# Patient Record
Sex: Female | Born: 1980 | Race: White | Hispanic: No | Marital: Married | Smoking: Current some day smoker
Health system: Southern US, Community
[De-identification: ages and names within clinical notes are randomized; demographics above are authoritative.]

## PROBLEM LIST (undated history)

## (undated) ENCOUNTER — Emergency Department (HOSPITAL_COMMUNITY): Admission: EM | Payer: Medicaid Other | Source: Home / Self Care

## (undated) DIAGNOSIS — R87619 Unspecified abnormal cytological findings in specimens from cervix uteri: Secondary | ICD-10-CM

## (undated) DIAGNOSIS — N200 Calculus of kidney: Secondary | ICD-10-CM

## (undated) DIAGNOSIS — R51 Headache: Secondary | ICD-10-CM

## (undated) DIAGNOSIS — Z8719 Personal history of other diseases of the digestive system: Secondary | ICD-10-CM

## (undated) DIAGNOSIS — IMO0002 Reserved for concepts with insufficient information to code with codable children: Secondary | ICD-10-CM

## (undated) DIAGNOSIS — D649 Anemia, unspecified: Secondary | ICD-10-CM

## (undated) DIAGNOSIS — K219 Gastro-esophageal reflux disease without esophagitis: Secondary | ICD-10-CM

## (undated) DIAGNOSIS — R079 Chest pain, unspecified: Secondary | ICD-10-CM

## (undated) DIAGNOSIS — F419 Anxiety disorder, unspecified: Secondary | ICD-10-CM

## (undated) DIAGNOSIS — R519 Headache, unspecified: Secondary | ICD-10-CM

## (undated) HISTORY — DX: Chest pain, unspecified: R07.9

## (undated) HISTORY — PX: UPPER GASTROINTESTINAL ENDOSCOPY: SHX188

## (undated) HISTORY — PX: WISDOM TOOTH EXTRACTION: SHX21

## (undated) HISTORY — PX: CHOLECYSTECTOMY: SHX55

## (undated) HISTORY — DX: Reserved for concepts with insufficient information to code with codable children: IMO0002

## (undated) HISTORY — PX: ABDOMINAL HYSTERECTOMY: SHX81

## (undated) HISTORY — DX: Unspecified abnormal cytological findings in specimens from cervix uteri: R87.619

---

## 1998-06-20 ENCOUNTER — Emergency Department (HOSPITAL_COMMUNITY): Admission: EM | Admit: 1998-06-20 | Discharge: 1998-06-20 | Payer: Self-pay | Admitting: Emergency Medicine

## 1998-09-01 ENCOUNTER — Other Ambulatory Visit: Admission: RE | Admit: 1998-09-01 | Discharge: 1998-09-01 | Payer: Self-pay | Admitting: *Deleted

## 1998-10-01 ENCOUNTER — Encounter (INDEPENDENT_AMBULATORY_CARE_PROVIDER_SITE_OTHER): Payer: Self-pay | Admitting: Specialist

## 1998-10-01 ENCOUNTER — Other Ambulatory Visit: Admission: RE | Admit: 1998-10-01 | Discharge: 1998-10-01 | Payer: Self-pay | Admitting: *Deleted

## 1999-04-19 ENCOUNTER — Other Ambulatory Visit: Admission: RE | Admit: 1999-04-19 | Discharge: 1999-04-19 | Payer: Self-pay | Admitting: Obstetrics and Gynecology

## 1999-05-20 ENCOUNTER — Other Ambulatory Visit: Admission: RE | Admit: 1999-05-20 | Discharge: 1999-05-20 | Payer: Self-pay | Admitting: Obstetrics and Gynecology

## 1999-05-20 ENCOUNTER — Encounter (INDEPENDENT_AMBULATORY_CARE_PROVIDER_SITE_OTHER): Payer: Self-pay | Admitting: Specialist

## 1999-05-31 ENCOUNTER — Ambulatory Visit (HOSPITAL_COMMUNITY): Admission: RE | Admit: 1999-05-31 | Discharge: 1999-05-31 | Payer: Self-pay | Admitting: Obstetrics and Gynecology

## 1999-10-11 ENCOUNTER — Other Ambulatory Visit: Admission: RE | Admit: 1999-10-11 | Discharge: 1999-10-11 | Payer: Self-pay | Admitting: *Deleted

## 2000-02-29 ENCOUNTER — Other Ambulatory Visit: Admission: RE | Admit: 2000-02-29 | Discharge: 2000-02-29 | Payer: Self-pay | Admitting: *Deleted

## 2000-10-09 ENCOUNTER — Inpatient Hospital Stay (HOSPITAL_COMMUNITY): Admission: AD | Admit: 2000-10-09 | Discharge: 2000-10-09 | Payer: Self-pay | Admitting: *Deleted

## 2000-10-15 ENCOUNTER — Inpatient Hospital Stay (HOSPITAL_COMMUNITY): Admission: AD | Admit: 2000-10-15 | Discharge: 2000-10-15 | Payer: Self-pay | Admitting: *Deleted

## 2001-02-26 ENCOUNTER — Inpatient Hospital Stay (HOSPITAL_COMMUNITY): Admission: AD | Admit: 2001-02-26 | Discharge: 2001-02-26 | Payer: Self-pay | Admitting: *Deleted

## 2001-04-05 ENCOUNTER — Inpatient Hospital Stay (HOSPITAL_COMMUNITY): Admission: AD | Admit: 2001-04-05 | Discharge: 2001-04-07 | Payer: Self-pay | Admitting: *Deleted

## 2001-04-05 ENCOUNTER — Encounter (INDEPENDENT_AMBULATORY_CARE_PROVIDER_SITE_OTHER): Payer: Self-pay

## 2001-11-01 ENCOUNTER — Other Ambulatory Visit: Admission: RE | Admit: 2001-11-01 | Discharge: 2001-11-01 | Payer: Self-pay | Admitting: *Deleted

## 2002-05-27 ENCOUNTER — Other Ambulatory Visit: Admission: RE | Admit: 2002-05-27 | Discharge: 2002-05-27 | Payer: Self-pay | Admitting: *Deleted

## 2002-11-14 ENCOUNTER — Other Ambulatory Visit: Admission: RE | Admit: 2002-11-14 | Discharge: 2002-11-14 | Payer: Self-pay | Admitting: *Deleted

## 2003-10-03 ENCOUNTER — Other Ambulatory Visit: Admission: RE | Admit: 2003-10-03 | Discharge: 2003-10-03 | Payer: Self-pay | Admitting: Obstetrics and Gynecology

## 2004-11-12 ENCOUNTER — Inpatient Hospital Stay (HOSPITAL_COMMUNITY): Admission: AD | Admit: 2004-11-12 | Discharge: 2004-11-14 | Payer: Self-pay | Admitting: Obstetrics and Gynecology

## 2004-11-16 ENCOUNTER — Encounter: Admission: RE | Admit: 2004-11-16 | Discharge: 2004-12-16 | Payer: Self-pay | Admitting: Obstetrics and Gynecology

## 2005-01-20 ENCOUNTER — Ambulatory Visit: Payer: Self-pay | Admitting: Family Medicine

## 2005-02-03 ENCOUNTER — Other Ambulatory Visit: Admission: RE | Admit: 2005-02-03 | Discharge: 2005-02-03 | Payer: Self-pay | Admitting: Obstetrics and Gynecology

## 2005-02-18 ENCOUNTER — Ambulatory Visit: Payer: Self-pay | Admitting: Family Medicine

## 2009-04-01 ENCOUNTER — Inpatient Hospital Stay (HOSPITAL_COMMUNITY): Admission: EM | Admit: 2009-04-01 | Discharge: 2009-04-03 | Payer: Self-pay | Admitting: General Surgery

## 2009-04-02 ENCOUNTER — Encounter (INDEPENDENT_AMBULATORY_CARE_PROVIDER_SITE_OTHER): Payer: Self-pay | Admitting: General Surgery

## 2010-05-13 LAB — COMPREHENSIVE METABOLIC PANEL
ALT: 220 U/L — ABNORMAL HIGH (ref 0–35)
AST: 195 U/L — ABNORMAL HIGH (ref 0–37)
AST: 64 U/L — ABNORMAL HIGH (ref 0–37)
Albumin: 3.7 g/dL (ref 3.5–5.2)
Alkaline Phosphatase: 75 U/L (ref 39–117)
Alkaline Phosphatase: 80 U/L (ref 39–117)
Alkaline Phosphatase: 86 U/L (ref 39–117)
BUN: 6 mg/dL (ref 6–23)
CO2: 26 mEq/L (ref 19–32)
CO2: 27 mEq/L (ref 19–32)
Calcium: 8.4 mg/dL (ref 8.4–10.5)
Calcium: 8.6 mg/dL (ref 8.4–10.5)
Calcium: 9.2 mg/dL (ref 8.4–10.5)
Chloride: 109 mEq/L (ref 96–112)
Creatinine, Ser: 0.76 mg/dL (ref 0.4–1.2)
Creatinine, Ser: 0.78 mg/dL (ref 0.4–1.2)
GFR calc Af Amer: >60 mL/min (ref >60)
GFR calc Af Amer: >60 mL/min (ref >60)
GFR calc Af Amer: >60 mL/min (ref >60)
Glucose, Bld: 93 mg/dL (ref 70–99)
Glucose, Bld: 98 mg/dL (ref 70–99)
Sodium: 138 mEq/L (ref 135–145)
Total Bilirubin: 0.5 mg/dL (ref 0.3–1.2)
Total Bilirubin: 0.6 mg/dL (ref 0.3–1.2)
Total Bilirubin: 0.7 mg/dL (ref 0.3–1.2)
Total Protein: 5.8 g/dL — ABNORMAL LOW (ref 6.0–8.3)

## 2010-05-13 LAB — DIFFERENTIAL
Eosinophils Absolute: 0 10*3/uL (ref 0.0–0.7)
Eosinophils Absolute: 0.2 10*3/uL (ref 0.0–0.7)
Monocytes Relative: 8 % (ref 3–12)
Neutrophils Relative %: 60 % (ref 43–77)
Neutrophils Relative %: 76 % (ref 43–77)

## 2010-05-13 LAB — CBC
HCT: 29.8 % — ABNORMAL LOW (ref 36.0–46.0)
Hemoglobin: 10 g/dL — ABNORMAL LOW (ref 12.0–15.0)
MCHC: 33.3 g/dL (ref 30.0–36.0)
MCHC: 33.6 g/dL (ref 30.0–36.0)
MCV: 83.9 fL (ref 78.0–100.0)
MCV: 84.9 fL (ref 78.0–100.0)
Platelets: 284 10*3/uL (ref 150–400)
RBC: 3.49 MIL/uL — ABNORMAL LOW (ref 3.87–5.11)
RDW: 15.5 % (ref 11.5–15.5)
RDW: 15.7 % — ABNORMAL HIGH (ref 11.5–15.5)
WBC: 9.8 10*3/uL (ref 4.0–10.5)

## 2010-05-13 LAB — LIPASE, BLOOD: Lipase: 28 U/L (ref 11–59)

## 2010-05-13 LAB — PREGNANCY, URINE: Preg Test, Ur: NEGATIVE

## 2010-07-09 NOTE — Op Note (Signed)
[facility]  Patient:    Angela Hartman, Angela Hartman                     MRN: [id number] Proc. Date: 05/31/99 Adm. 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  OPERATION:                    CO2 laser ablation of the cervix.  SURGEON:                      Lenoard Aden, M.D.  ASSISTANT:  ANESTHESIA:                   General anesthesia.  ESTIMATED BLOOD LOSS:         Less than 50 cc.  COMPLICATIONS:                None.  DRAINS:                       None.  COUNTS:                       Correct.  CONDITION:                    The patient to the recovery room in good condition.  DESCRIPTION OF PROCEDURE:     After being apprised of the risks of anesthesia, infection, bleeding, injury to bowel, inability to cure abnormal Pap smear, the  patient is brought to the operating room where she was placed in the dorsal lithotomy position and administered general anesthesia without complications. he was catheterized until the bladder was empty.  The colposcope was setup. Bivalve speculum placed.  Cervix infiltrated with a dilute Marcaine with epinephrine solution and lugols solution used to outline the transformation zone.  CO2 laser at 10 watts of power was used to ablate the transformation zone circumferentially in a tophat distribution down to a 7 mm depth.  This was accomplished with minimal difficulty.  Good hemostasis is achieved.  Monsels solution is placed.  Good hemostasis is noted.  All instruments are removed.  The patient tolerates the procx well and is transferred to the recovery room in good condition. DD:  05/31/99 TD:  05/31/99 Job: [id number]

## 2010-07-24 ENCOUNTER — Emergency Department (HOSPITAL_COMMUNITY): Payer: Medicaid Other

## 2010-07-24 ENCOUNTER — Emergency Department (HOSPITAL_COMMUNITY)
Admission: EM | Admit: 2010-07-24 | Discharge: 2010-07-24 | Disposition: A | Discharge status: Home / Self Care | Payer: Medicaid Other | Attending: Emergency Medicine | Admitting: Emergency Medicine

## 2010-07-24 DIAGNOSIS — N390 Urinary tract infection, site not specified: Secondary | ICD-10-CM | POA: Insufficient documentation

## 2010-07-24 DIAGNOSIS — N200 Calculus of kidney: Secondary | ICD-10-CM | POA: Insufficient documentation

## 2010-07-24 LAB — URINALYSIS, ROUTINE W REFLEX MICROSCOPIC
Ketones, ur: 15 mg/dL — AB
Nitrite: NEGATIVE
Urobilinogen, UA: 0.2 mg/dL (ref 0.0–1.0)

## 2010-07-24 LAB — CBC
HCT: 32.9 % — ABNORMAL LOW (ref 36.0–46.0)
Hemoglobin: 10.9 g/dL — ABNORMAL LOW (ref 12.0–15.0)
MCH: 26.7 pg (ref 26.0–34.0)
MCHC: 33.1 g/dL (ref 30.0–36.0)
MCV: 80.6 fL (ref 78.0–100.0)
Platelets: 260 10*3/uL (ref 150–400)

## 2010-07-24 LAB — COMPREHENSIVE METABOLIC PANEL
AST: 23 U/L (ref 0–37)
CO2: 25 mEq/L (ref 19–32)
Calcium: 9.2 mg/dL (ref 8.4–10.5)
Creatinine, Ser: 0.83 mg/dL (ref 0.4–1.2)
GFR calc non Af Amer: >60 mL/min (ref >60)
Sodium: 140 mEq/L (ref 135–145)

## 2010-07-24 LAB — POCT PREGNANCY, URINE: Preg Test, Ur: NEGATIVE

## 2010-07-24 LAB — DIFFERENTIAL
Lymphocytes Relative: 19 % (ref 12–46)
Lymphs Abs: 1.5 10*3/uL (ref 0.7–4.0)
Neutrophils Relative %: 72 % (ref 43–77)

## 2010-07-24 LAB — URINE MICROSCOPIC-ADD ON

## 2010-07-26 LAB — URINE CULTURE: Colony Count: 25000

## 2010-09-08 ENCOUNTER — Other Ambulatory Visit: Payer: Self-pay | Admitting: Obstetrics and Gynecology

## 2011-03-23 ENCOUNTER — Other Ambulatory Visit: Payer: Self-pay | Admitting: Obstetrics and Gynecology

## 2011-10-25 ENCOUNTER — Other Ambulatory Visit: Payer: Self-pay | Admitting: Obstetrics and Gynecology

## 2012-06-19 ENCOUNTER — Emergency Department (HOSPITAL_COMMUNITY): Payer: 59

## 2012-06-19 ENCOUNTER — Encounter (HOSPITAL_COMMUNITY): Payer: Self-pay | Admitting: Emergency Medicine

## 2012-06-19 ENCOUNTER — Emergency Department (HOSPITAL_COMMUNITY)
Admission: EM | Admit: 2012-06-19 | Discharge: 2012-06-19 | Disposition: A | Discharge status: Home / Self Care | Payer: 59 | Attending: Emergency Medicine | Admitting: Emergency Medicine

## 2012-06-19 ENCOUNTER — Other Ambulatory Visit: Payer: Self-pay

## 2012-06-19 DIAGNOSIS — Z9089 Acquired absence of other organs: Secondary | ICD-10-CM | POA: Insufficient documentation

## 2012-06-19 DIAGNOSIS — E876 Hypokalemia: Secondary | ICD-10-CM

## 2012-06-19 DIAGNOSIS — F17200 Nicotine dependence, unspecified, uncomplicated: Secondary | ICD-10-CM | POA: Insufficient documentation

## 2012-06-19 DIAGNOSIS — R0789 Other chest pain: Secondary | ICD-10-CM

## 2012-06-19 DIAGNOSIS — R42 Dizziness and giddiness: Secondary | ICD-10-CM | POA: Insufficient documentation

## 2012-06-19 DIAGNOSIS — Z3202 Encounter for pregnancy test, result negative: Secondary | ICD-10-CM | POA: Insufficient documentation

## 2012-06-19 DIAGNOSIS — D649 Anemia, unspecified: Secondary | ICD-10-CM | POA: Insufficient documentation

## 2012-06-19 DIAGNOSIS — R109 Unspecified abdominal pain: Secondary | ICD-10-CM | POA: Insufficient documentation

## 2012-06-19 LAB — CBC
HCT: 33.7 % — ABNORMAL LOW (ref 36.0–46.0)
Hemoglobin: 11.3 g/dL — ABNORMAL LOW (ref 12.0–15.0)
RDW: 16.3 % — ABNORMAL HIGH (ref 11.5–15.5)
WBC: 10 10*3/uL (ref 4.0–10.5)

## 2012-06-19 LAB — BASIC METABOLIC PANEL
BUN: 15 mg/dL (ref 6–23)
Chloride: 103 mEq/L (ref 96–112)
GFR calc Af Amer: >90 mL/min (ref >90)
Glucose, Bld: 108 mg/dL — ABNORMAL HIGH (ref 70–99)
Potassium: 3.1 mEq/L — ABNORMAL LOW (ref 3.5–5.1)

## 2012-06-19 LAB — POCT PREGNANCY, URINE: Preg Test, Ur: NEGATIVE

## 2012-06-19 LAB — LIPASE, BLOOD: Lipase: 29 U/L (ref 11–59)

## 2012-06-19 MED ORDER — POTASSIUM CHLORIDE CRYS ER 20 MEQ PO TBCR: 40.0000 meq | EXTENDED_RELEASE_TABLET | Freq: Once | ORAL | Status: DC

## 2012-06-19 MED ORDER — POTASSIUM CHLORIDE 20 MEQ/15ML (10%) PO LIQD
40.0000 meq | Freq: Once | ORAL | Status: AC
  Administered 2012-06-19: 40 meq via ORAL
  Filled 2012-06-19: qty 30

## 2012-06-19 NOTE — ED Notes (Signed)
Pt c/o generalized CP with some sharp pain on left and right; pt sts pain through to back; pt sts x 1 week; pt sts worse with inspiration

## 2012-06-19 NOTE — ED Provider Notes (Signed)
Medical screening examination/treatment/procedure(s) were conducted as a shared visit with non-physician practitioner(s) and myself.  I personally evaluated the patient during the encounter  Koreena Joost, MD 06/19/12 1617 

## 2012-06-19 NOTE — ED Provider Notes (Signed)
Complains of chest pain anterior lasting 1.5 weeks intermittent last 1-2 hours at a time has had pain continuously since awakening this morning. Pain is presently mild nonexertional no cough no fever. Cardiac risk factors smoker,, father had MI age 34. On exam alert no distress lungs clear auscultation heart regular rate and rhythm no murmurs rubs abdomen nondistended nontender extremities without edema or tenderness Pain is atypical for angina or acute coronary syndrome in this young menstruating female with normal EKG. She is encouraged to stop smoking. [facility] atypical [facility] pain  Doug Sou, MD 06/19/12 1229

## 2012-06-19 NOTE — ED Notes (Signed)
Patient transported to X-ray 

## 2012-06-19 NOTE — ED Provider Notes (Signed)
History     CSN: [id number]  Arrival date & time 06/19/12  1010   First MD Initiated Contact with Patient 06/19/12 1048      Chief Complaint  Patient presents with  . Chest Pain    (Consider location/radiation/quality/duration/timing/severity/associated sxs/prior treatment) HPI Comments: Angela Hartman is a 32 y/o F presenting to the ED with chest pain that has been ongoing for the past week. Patient reported that this morning, at 7:00AM this morning she was experiencing chest pain that was described as a sharp, shooting pain across her chest without radiation to back, neck, shoulders, arms. Stated that the chest pain can waxes and wanes - occurring with rest and with activity- stating that the chest pain lasts a couple of seconds, occurring at least 2-3 times per day. Reported onset of right sided abdominal pain that radiates down to right side of leg that started over the past couple of days, described as a dull constant pain. Patient reported that she used ASA 81 mg, took 4 of them on Thursday when she was experiencing chest pain. Patient reported that she was experiencing low back pain on Friday, described as a sharp constant pain that has resolved since Saturday evening - nothing used. Denied history of cardiac problems, birth control, blood clots. Reported that her father had history of MI. Denied shortness of breathe, difficulty breathing, neck pain, headache, weakness, syncope, loss of vision, visual distortions, nausea, vomiting, diarrhea, trauma, sick contacts, falls.    Patient reported having an appointment with [facility] tomorrow.   The history is provided by the patient. No language interpreter was used.    History reviewed. No pertinent past medical history.  Past Surgical History  Procedure Laterality Date  . Cholecystectomy      History reviewed. No pertinent family history.  History  Substance Use Topics  . Smoking status: Current Every Day Smoker  .  Smokeless tobacco: Not on file  . Alcohol Use: No    OB History   Grav Para Term Preterm Abortions TAB SAB Ect Mult Living                  Review of Systems  Constitutional: Negative for fever, chills and diaphoresis.  HENT: Negative for ear pain, congestion, sore throat, trouble swallowing, neck pain and tinnitus.   Eyes: Negative for pain and visual disturbance.  Respiratory: Negative for cough, chest tightness and shortness of breath.   Cardiovascular: Positive for chest pain. Negative for leg swelling.  Gastrointestinal: Negative for nausea, vomiting, abdominal pain, diarrhea and constipation.  Genitourinary: Negative for dysuria, hematuria, decreased urine volume and difficulty urinating.  Musculoskeletal: Negative for back pain.  Skin: Negative for rash.  Neurological: Positive for dizziness. Negative for weakness, light-headedness, numbness and headaches.  All other systems reviewed and are negative.    Allergies  Penicillins  Home Medications  No current outpatient prescriptions on file.  BP 106/69  Pulse 66  Temp(Src) 97.6 F (36.4 C) (Oral)  Resp 18  SpO2 99%  LMP 06/19/2012  Physical Exam  Nursing note and vitals reviewed. Constitutional: She is oriented to person, place, and time. She appears well-developed and well-nourished.  Non-toxic appearance. She does not have a sickly appearance. She does not appear ill. No distress.  HENT:  Head: Normocephalic and atraumatic.  Mouth/Throat: Oropharynx is clear and moist. No oropharyngeal exudate.  Uvula midline, symmetrical elevation  Eyes: Conjunctivae and EOM are normal. Pupils are equal, round, and reactive to light. Right eye exhibits  no discharge. Left eye exhibits no discharge.  Neck: Normal range of motion. Neck supple. No muscular tenderness present. No rigidity. No tracheal deviation and normal range of motion present.  Negative nuchal rigidity Negative lymphadenopathy  Cardiovascular: Normal rate,  regular rhythm, normal heart sounds and normal pulses.   Pulses:      Radial pulses are 2+ on the right side, and 2+ on the left side.       Dorsalis pedis pulses are 2+ on the right side, and 2+ on the left side.  Radial pulses 2+ bilaterally Pedal pulses 2+ bilaterally Negative leg and ankle swelling Negative pitting edema  Pulmonary/Chest: Effort normal and breath sounds normal. No respiratory distress. She has no decreased breath sounds. She has no wheezes. She has no rhonchi. She has no rales. She exhibits no tenderness.  Abdominal: Soft. Bowel sounds are normal. She exhibits no distension and no mass. There is no hepatosplenomegaly. There is tenderness in the epigastric area. There is no rigidity, no rebound and no guarding.    Tenderness to palpation to epigastric region   Musculoskeletal: Normal range of motion. She exhibits no edema and no tenderness.  Full ROM to upper and lower extremities bilaterally  Strength 5+/5+ to upper and lower extremities bilaterally  Lymphadenopathy:    She has no cervical adenopathy.  Neurological: She is alert and oriented to person, place, and time. No cranial nerve deficit. She exhibits normal muscle tone. Coordination normal.  Skin: Skin is warm and dry. No rash noted. She is not diaphoretic. No erythema.  Psychiatric: She has a normal mood and affect. Her behavior is normal. Thought content normal.    ED Course  Procedures (including critical care time)  Labs reviewed. BMP- potassium low (3.1) - potassium 40 mEq given PO.  Negative pregnancy test CBC- low Hgb (11.3) and Hct (33.7) - trend found in patient  EKG negative findings - no STEMI/NSTEMI Chest xray negative acute cardiopulmonary findings.   1:36PM patient reassessed - reported chest pain that is bearable, across chest on both sides, lasting a couple of seconds, increased frequency - declined offer of medications. Discussed lab results with patient.   3:04PM lipase reviewed within  normal limits (29).     Date: 06/19/2012  Rate: 69  Rhythm: normal sinus rhythm  QRS Axis: right  Intervals: normal  ST/T Wave abnormalities: normal  Conduction Disutrbances:none  Narrative Interpretation:   Old EKG Reviewed: none available     Labs Reviewed  CBC - Abnormal; Notable for the following:    Hemoglobin 11.3 (*)    HCT 33.7 (*)    RDW 16.3 (*)    All other components within normal limits  BASIC METABOLIC PANEL - Abnormal; Notable for the following:    Potassium 3.1 (*)    Glucose, Bld 108 (*)    All other components within normal limits  LIPASE, BLOOD  POCT I-STAT TROPONIN I  POCT PREGNANCY, URINE   Dg Chest 2 View  06/19/2012  *RADIOLOGY REPORT*  Clinical Data: Chest pain.  CHEST - 2 VIEW  Comparison: None.  Findings: Heart and mediastinal contours are within normal limits. No focal opacities or effusions.  No acute bony abnormality.  IMPRESSION: No active cardiopulmonary disease.   Original Report Authenticated By: Charlett Nose, M.D.      1. Atypical chest pain   2. Hypokalemia   3. Anemia       MDM  I personally evaluated and examined the patient. Patient afebrile, normotensive, non-tachycardic, alert and  oriented. Discussed case with Dr. Ethelda Chick - Dr. Ethelda Chick saw patient and assessed her. Labs and imaging ordered to r/o possible MI. EKG negative findings for STEMI/NSTEMI and Troponins negative (0.00) r/o MI. Mild tenderness noted to epigastric region upon physical exam - lipase ordered to r/o pancreatitis beginnings - lipase within normal limits (29) - r/o pancreatitis - patient has had cholecystectomy approximately 2-3 years ago. CBC trend noted with findings of anemia - Hgb 11.3, Hct 33.7. CMP hypokalemia (3.1) - potassium given 40 mEq PO. Chest xray negative findings for acute cardiopulmonary disease. Negative findings on labs and imaging. Negative acute abdomen or peritoneal issues. PERC score low - less likely PE - non-tachycardic and  non-tachypneic - denies travel, birth control, pregnancy - no hypercoaguable state noted. Patient aseptic, non-toxic appearing, in no acute distress. Discharged patient. Diagnosed with atypical chest pain with unknown etiology. Stressed importance of following up with [facility] tomorrow (06/20/2012) - has an appointment at 1:00PM - stressed importance of keeping appointment - discussed possibility of having a holter monitor to check and see if any abnormalities of the heart are noted. Discussed with patient to [facility] with [facility]. Discussed taking multi-vitamin. Discussed with patient to rest, stay hydrated and to use ASA as when needed for chest discomfort. Discussed with patient about smoking cessation and affects of smoking on health - discussed with patient to follow with PCP regarding ways to stop smoking. Discussed with patient to monitor symptoms and if symptoms are to worsen or change to report back to the ED. Patient agreed to plan of care, understood, all questions answered.        Raymon Mutton, PA-C 06/19/12 1544

## 2013-03-28 ENCOUNTER — Encounter (HOSPITAL_COMMUNITY): Payer: Self-pay | Admitting: Emergency Medicine

## 2013-03-28 ENCOUNTER — Emergency Department (HOSPITAL_COMMUNITY)
Admission: EM | Admit: 2013-03-28 | Discharge: 2013-03-28 | Disposition: A | Discharge status: Home / Self Care | Payer: 59 | Source: Home / Self Care | Attending: Emergency Medicine | Admitting: Emergency Medicine

## 2013-03-28 ENCOUNTER — Emergency Department (INDEPENDENT_AMBULATORY_CARE_PROVIDER_SITE_OTHER): Payer: 59

## 2013-03-28 DIAGNOSIS — M792 Neuralgia and neuritis, unspecified: Secondary | ICD-10-CM

## 2013-03-28 DIAGNOSIS — IMO0002 Reserved for concepts with insufficient information to code with codable children: Secondary | ICD-10-CM

## 2013-03-28 MED ORDER — PREDNISONE 20 MG PO TABS: ORAL_TABLET | ORAL | Status: DC

## 2013-03-28 MED ORDER — GABAPENTIN 250 MG/5ML PO SOLN: ORAL | Status: DC

## 2013-03-28 NOTE — ED Notes (Signed)
Pt c/o intermittent chest pain onset 2 weeks Pain is described as "sharp" and will last for few seconds Smokes 1 PPD... No hx of med prob Alert w/no signs of acute distress.

## 2013-03-28 NOTE — ED Provider Notes (Signed)
Chief Complaint   Chief Complaint  Patient presents with  . Chest Pain    History of Present Illness    Angela Hartman is a 33 year old female who's had a one-month history of intermittent chest pain. The pain is described as a sharp left pectoral pain lasting a second or 2 at a time rated 8/10 in intensity followed by a dull ache in the right and left pectoral areas lasting for hours at a time and rated a 5/10 in intensity. Nothing seems to precipitate the sharp pain. It's not related to position, exertion, activity, exercise, or meals. It just seems to come on randomly throughout the day. It may occur once or several times per day but it seems to occur almost every day. It's not been associated with any fever, chills, URI symptoms, coughing, wheezing, or shortness of breath. She denies any palpitations, dizziness, or syncope. She's had no abdominal pain, nausea, vomiting, or indigestion. She's had some pain that radiates into her left arm and some numbness and tingling in her left arm but no muscle weakness or swelling. She has some lower back pain and she feels dizzy at times.  Review of Systems    Other than noted above, the patient denies any of the following symptoms. Systemic:  No fever, chills, sweats, or fatigue. ENT:  No nasal congestion, rhinorrhea, or sore throat. Pulmonary:  No cough, wheezing, shortness of breath, sputum production, hemoptysis. Cardiac:  No palpitations, rapid heartbeat, dizziness, presyncope or syncope. GI:  No abdominal pain, heartburn, nausea, or vomiting. Ext:  No leg pain or swelling.  [place]    Past medical history, family history, social history, meds, and allergies were reviewed and updated as needed.   Physical Exam     Vital signs:  BP 131/74  Pulse 90  Temp(Src) 98.8 F (37.1 C) (Oral)  Resp 16  SpO2 100%  LMP 03/08/2013 Gen:  Alert, oriented, in no distress, skin warm and dry. Eye:  PERRL, lids and conjunctivas normal.  Sclera  non-icteric. ENT:  Mucous membranes moist, pharynx clear. Neck:  Supple, no adenopathy or tenderness.  No JVD. Lungs:  Clear to auscultation, no wheezes, rales or rhonchi.  No respiratory distress. Heart:  Regular rhythm.  No gallops, murmers, clicks or rubs. Chest:  She has moderate chest wall tenderness to palpation in the left pectoral area. Abdomen:  Soft, nontender, no organomegaly or mass.  Bowel sounds normal.  No pulsatile abdominal mass or bruit. Ext:  No edema.  No calf tenderness and Homann's sign negative.  Pulses full and equal. Skin:  Warm and dry.  No rash.    Radiology     Dg Chest 2 View  03/28/2013   CLINICAL DATA:  Left chest pain  EXAM: CHEST  2 VIEW  COMPARISON:  June 19, 2012  FINDINGS: The heart size and mediastinal contours are within normal limits. There is no focal infiltrate, pulmonary edema, or pleural effusion. The visualized skeletal structures are stable.  IMPRESSION: No active cardiopulmonary disease.   Electronically Signed   By: Abelardo Diesel M.D.   On: 03/28/2013 14:36   I reviewed the images independently and personally and concur with the radiologist's findings.  Electrocardiogram     Date: 03/28/2013  Rate: 81  Rhythm: normal sinus rhythm  QRS Axis: normal  Intervals: normal  ST/T Wave abnormalities: nonspecific T wave changes  Conduction Disutrbances:none  Narrative Interpretation: Normal sinus rhythm, nonspecific T wave changes, no change since last tracing.  Old EKG Reviewed:  unchanged  Assessment     The encounter diagnosis was Neuritis.  Differential diagnosis includes muscular pain, costochondritis, reflux esophagitis, or neuritis or neuralgia. Given her description the pain, I think the most likely causes neuritis or neuralgia, possibly due to cervical or thoracic radiculopathy. We'll treat for right now of gabapentin and prednisone. Suggested she see her primary care physician within the next week.  Plan     1.  Meds:  The following  meds were prescribed:   Discharge Medication List as of 03/28/2013  3:01 PM    START taking these medications   Details  gabapentin (NEURONTIN) 250 MG/5ML solution 1 tsp (5 mL) daily for 3 days, 1 tsp BID for 3 days, 1 tsp TID thereafter, Normal    predniSONE (DELTASONE) 20 MG tablet 3 daily for 5 days, 2 daily for 5 days, 1 daily for 5 days., Normal        2.  Patient Education/Counseling:  The patient was given appropriate handouts, self care instructions, and instructed in symptomatic relief.    3.  Follow up:  The patient was told to follow up here if no better in 3 to 4 days, or sooner if becoming worse in any way, and give an an some red flag symptoms such as worsening pain, shortness of breath, dizziness, or passing out which would prompt immediate return. Followup with me to primary care within the next week.     Harden Mo, MD 03/28/13 [phone]

## 2013-03-28 NOTE — Discharge Instructions (Signed)
TREATMENT  °Treatment initially involves the use of ice and medication to help reduce pain and inflammation. It is also important to perform strengthening and stretching exercises and modify activities that worsen symptoms so the injury does not get worse. These exercises may be performed at home or with a therapist. For patients who experience severe symptoms, a soft padded collar may be recommended to be worn around the neck.  °Improving your posture may help reduce symptoms. Posture improvement includes pulling your chin and abdomen in while sitting or standing. If you are sitting, sit in a firm chair with your buttocks against the back of the chair. While sleeping, try replacing your pillow with a small towel rolled to 2 inches in diameter, or use a cervical pillow. Poor sleeping positions delay healing.  ° °MEDICATION  °· If pain medication is necessary, nonsteroidal anti-inflammatory medications, such as aspirin and ibuprofen, or other minor pain relievers, such as acetaminophen, are often recommended. °· Do not take pain medication for 7 days before surgery. °· Prescription pain relievers may be given if deemed necessary by your caregiver. Use only as directed and only as much as you need. ° °HEAT AND COLD:  °· Cold treatment (icing) relieves pain and reduces inflammation. Cold treatment should be applied for 10 to 15 minutes every 2 to 3 hours for inflammation and pain and immediately after any activity that aggravates your symptoms. Use ice packs or an ice massage. °· Heat treatment may be used prior to performing the stretching and strengthening activities prescribed by your caregiver, physical therapist, or athletic trainer. Use a heat pack or a warm soak. ° °SEEK MEDICAL CARE IF:  °· Symptoms get worse or do not improve in 2 weeks despite treatment. °· New, unexplained symptoms develop (drugs used in treatment may produce side effects). ° °EXERCISES °RANGE OF MOTION (ROM) AND STRETCHING EXERCISES -  Cervical Strain and Sprain °These exercises may help you when beginning to rehabilitate your injury. In order to successfully resolve your symptoms, you must improve your posture. These exercises are designed to help reduce the forward-head and rounded-shoulder posture which contributes to this condition. Your symptoms may resolve with or without further involvement from your physician, physical therapist or athletic trainer. While completing these exercises, remember:  °· Restoring tissue flexibility helps normal motion to return to the joints. This allows healthier, less painful movement and activity. °· An effective stretch should be held for at least 20 seconds, although you may need to begin with shorter hold times for comfort. °· A stretch should never be painful. You should only feel a gentle lengthening or release in the stretched tissue. ° °STRETCH- Axial Extensors °· Lie on your back on the floor. You may bend your knees for comfort. Place a rolled up hand towel or dish towel, about 2 inches in diameter, under the part of your head that makes contact with the floor. °· Gently tuck your chin, as if trying to make a "double chin," until you feel a gentle stretch at the base of your head. °· Hold _____10_____ seconds. °Repeat _____10_____ times. Complete this exercise _____2_____ times per day.  ° °STRETECH - Axial Extension  °· Stand or sit on a firm surface. Assume a good posture: chest up, shoulders drawn back, abdominal muscles slightly tense, knees unlocked (if standing) and feet hip width apart. °· Slowly retract your chin so your head slides back and your chin slightly lowers.Continue to look straight ahead. °· You should feel a gentle stretch   in the back of your head. Be certain not to feel an aggressive stretch since this can cause headaches later. °· Hold for ____10______ seconds. °Repeat _____10_____ times. Complete this exercise ____2______ times per day. ° °STRETCH  Cervical Side Bend  °· Stand  or sit on a firm surface. Assume a good posture: chest up, shoulders drawn back, abdominal muscles slightly tense, knees unlocked (if standing) and feet hip width apart. °· Without letting your nose or shoulders move, slowly tip your right / left ear to your shoulder until your feel a gentle stretch in the muscles on the opposite side of your neck. °· Hold _____10_____ seconds. °Repeat _____10_____ times. Complete this exercise _____2_____ times per day. ° °STRETCH  Cervical Rotators  °· Stand or sit on a firm surface. Assume a good posture: chest up, shoulders drawn back, abdominal muscles slightly tense, knees unlocked (if standing) and feet hip width apart. °· Keeping your eyes level with the ground, slowly turn your head until you feel a gentle stretch along the back and opposite side of your neck. °· Hold _____10_____ seconds. °Repeat ____10______ times. Complete this exercise ____2______ times per day. ° °RANGE OF MOTION - Neck Circles  °· Stand or sit on a firm surface. Assume a good posture: chest up, shoulders drawn back, abdominal muscles slightly tense, knees unlocked (if standing) and feet hip width apart. °· Gently roll your head down and around from the back of one shoulder to the back of the other. The motion should never be forced or painful. °· Repeat the motion 10-20 times, or until you feel the neck muscles relax and loosen. °Repeat ____10______ times. Complete the exercise _____2_____ times per day. ° °STRENGTHENING EXERCISES - Cervical Strain and Sprain °These exercises may help you when beginning to rehabilitate your injury. They may resolve your symptoms with or without further involvement from your physician, physical therapist or athletic trainer. While completing these exercises, remember:  °· Muscles can gain both the endurance and the strength needed for everyday activities through controlled exercises. °· Complete these exercises as instructed by your physician, physical therapist or  athletic trainer. Progress the resistance and repetitions only as guided. °· You may experience muscle soreness or fatigue, but the pain or discomfort you are trying to eliminate should never worsen during these exercises. If this pain does worsen, stop and make certain you are following the directions exactly. If the pain is still present after adjustments, discontinue the exercise until you can discuss the trouble with your clinician. ° °STRENGTH Cervical Flexors, Isometric °· Face a wall, standing about 6 inches away. Place a small pillow, a ball about 6-8 inches in diameter, or a folded towel between your forehead and the wall. °· Slightly tuck your chin and gently push your forehead into the soft object. Push only with mild to moderate intensity, building up tension gradually. Keep your jaw and forehead relaxed. °· Hold 10 to 20 seconds. Keep your breathing relaxed. °· Release the tension slowly. Relax your neck muscles completely before you start the next repetition. °Repeat _____10_____ times. Complete this exercise _____2_____ times per day. ° °STRENGTH- Cervical Lateral Flexors, Isometric  °· Stand about 6 inches away from a wall. Place a small pillow, a ball about 6-8 inches in diameter, or a folded towel between the side of your head and the wall. °· Slightly tuck your chin and gently tilt your head into the soft object. Push only with mild to moderate intensity, building up tension gradually. Keep   your jaw and forehead relaxed. °· Hold 10 to 20 seconds. Keep your breathing relaxed. °· Release the tension slowly. Relax your neck muscles completely before you start the next repetition. °Repeat _____10_____ times. Complete this exercise ____2______ times per day. ° °STRENGTH  Cervical Extensors, Isometric  °· Stand about 6 inches away from a wall. Place a small pillow, a ball about 6-8 inches in diameter, or a folded towel between the back of your head and the wall. °· Slightly tuck your chin and gently  tilt your head back into the soft object. Push only with mild to moderate intensity, building up tension gradually. Keep your jaw and forehead relaxed. °· Hold 10 to 20 seconds. Keep your breathing relaxed. °· Release the tension slowly. Relax your neck muscles completely before you start the next repetition. °Repeat _____10_____ times. Complete this exercise _____2_____ times per day. ° °POSTURE AND BODY MECHANICS CONSIDERATIONS - Cervical Strain and Sprain °Keeping correct posture when sitting, standing or completing your activities will reduce the stress put on different body tissues, allowing injured tissues a chance to heal and limiting painful experiences. The following are general guidelines for improved posture. Your physician or physical therapist will provide you with any instructions specific to your needs. While reading these guidelines, remember: °· The exercises prescribed by your provider will help you have the flexibility and strength to maintain correct postures. °· The correct posture provides the optimal environment for your joints to work. All of your joints have less wear and tear when properly supported by a spine with good posture. This means you will experience a healthier, less painful body. °· Correct posture must be practiced with all of your activities, especially prolonged sitting and standing. Correct posture is as important when doing repetitive low-stress activities (typing) as it is when doing a single heavy-load activity (lifting). °PROLONGED STANDING WHILE SLIGHTLY LEANING FORWARD °When completing a task that requires you to lean forward while standing in one place for a long time, place either foot up on a stationary 2-4 inch high object to help maintain the best posture. When both feet are on the ground, the low back tends to lose its slight inward curve. If this curve flattens (or becomes too large), then the back and your other joints will experience too much stress, fatigue  more quickly and can cause pain.  °RESTING POSITIONS °Consider which positions are most painful for you when choosing a resting position. If you have pain with flexion-based activities (sitting, bending, stooping, squatting), choose a position that allows you to rest in a less flexed posture. You would want to avoid curling into a fetal position on your side. If your pain worsens with extension-based activities (prolonged standing, working overhead), avoid resting in an extended position such as sleeping on your stomach. Most people will find more comfort when they rest with their spine in a more neutral position, neither too rounded nor too arched. Lying on a non-sagging bed on your side with a pillow between your knees, or on your back with a pillow under your knees will often provide some relief. Keep in mind, being in any one position for a prolonged period of time, no matter how correct your posture, can still lead to stiffness. °WALKING °Walk with an upright posture. Your ears, shoulders and hips should all line-up. °OFFICE WORK °When working at a desk, create an environment that supports good, upright posture. Without extra support, muscles fatigue and lead to excessive strain on joints and other tissues. °  CHAIR: °· A chair should be able to slide under your desk when your back makes contact with the back of the chair. This allows you to work closely. °· The chair's height should allow your eyes to be level with the upper part of your monitor and your hands to be slightly lower than your elbows. °· Body position: °· Your feet should make contact with the floor. If this is not possible, use a foot rest. °· Keep your ears over your shoulders. This will reduce stress on your neck and low back. °Document Released: 02/07/2005 Document Revised: 05/02/2011 Document Reviewed: 05/22/2008 °[employer]® Patient Information ©2013 [employer]. ° ° °

## 2013-05-29 ENCOUNTER — Other Ambulatory Visit: Payer: Self-pay | Admitting: Obstetrics and Gynecology

## 2013-07-18 ENCOUNTER — Other Ambulatory Visit: Payer: Self-pay | Admitting: Obstetrics and Gynecology

## 2013-10-22 ENCOUNTER — Emergency Department (HOSPITAL_COMMUNITY)
Admission: EM | Admit: 2013-10-22 | Discharge: 2013-10-22 | Disposition: A | Discharge status: Home / Self Care | Payer: 59 | Source: Home / Self Care | Attending: Family Medicine | Admitting: Family Medicine

## 2013-10-22 ENCOUNTER — Ambulatory Visit (HOSPITAL_COMMUNITY): Payer: 59 | Attending: Family Medicine

## 2013-10-22 ENCOUNTER — Encounter (HOSPITAL_COMMUNITY): Payer: Self-pay | Admitting: Emergency Medicine

## 2013-10-22 DIAGNOSIS — R0602 Shortness of breath: Secondary | ICD-10-CM | POA: Insufficient documentation

## 2013-10-22 DIAGNOSIS — M940 Chondrocostal junction syndrome [Tietze]: Secondary | ICD-10-CM

## 2013-10-22 DIAGNOSIS — R079 Chest pain, unspecified: Secondary | ICD-10-CM | POA: Insufficient documentation

## 2013-10-22 DIAGNOSIS — Z87891 Personal history of nicotine dependence: Secondary | ICD-10-CM | POA: Diagnosis not present

## 2013-10-22 MED ORDER — IPRATROPIUM-ALBUTEROL 0.5-2.5 (3) MG/3ML IN SOLN
RESPIRATORY_TRACT | Status: AC
  Filled 2013-10-22: qty 3

## 2013-10-22 MED ORDER — MELOXICAM 7.5 MG/5ML PO SUSP: 15.0000 mg | Freq: Every day | ORAL | Status: DC | PRN

## 2013-10-22 MED ORDER — IPRATROPIUM-ALBUTEROL 0.5-2.5 (3) MG/3ML IN SOLN
3.0000 mL | Freq: Once | RESPIRATORY_TRACT | Status: AC
  Administered 2013-10-22: 3 mL via RESPIRATORY_TRACT

## 2013-10-22 MED ORDER — DICLOFENAC SODIUM 50 MG PO TBEC: 50.0000 mg | DELAYED_RELEASE_TABLET | Freq: Two times a day (BID) | ORAL | Status: DC | PRN

## 2013-10-22 NOTE — ED Provider Notes (Signed)
Angela Hartman is a 33 y.o. female who presents to Urgent Care today for chest pain. Patient has left sided chest pain radiating to the left shoulder. Symptoms have been present now for 2-3 days. The pain is regular motion. She denies any exertional component palpitations or shortness of breath. She is taking BC powder which helps. No fevers or chills nausea vomiting or diarrhea. No injury.   History reviewed. No pertinent past medical history. History  Substance Use Topics  . Smoking status: Current Every Day Smoker  . Smokeless tobacco: Not on file  . Alcohol Use: No   ROS as above Medications: No current facility-administered medications for this encounter.   Current Outpatient Prescriptions  Medication Sig Dispense Refill  . Aspirin-Caffeine 845-65 MG PACK Take by mouth.      . diclofenac (VOLTAREN) 50 MG EC tablet Take 1 tablet (50 mg total) by mouth 2 (two) times daily as needed.  30 tablet  0  . gabapentin (NEURONTIN) 250 MG/5ML solution 1 tsp (5 mL) daily for 3 days, 1 tsp BID for 3 days, 1 tsp TID thereafter  450 mL  0  . Meloxicam 7.5 MG/5ML SUSP Take 10 mLs (15 mg total) by mouth daily as needed (pain).  150 mL  0    Exam:  BP 113/80  Pulse 93  Temp(Src) 99.3 F (37.4 C) (Oral)  Resp 16  SpO2 100%  LMP 10/03/2013 Gen: Well NAD HEENT: EOMI,  MMM Lungs: Normal work of breathing. CTABL Heart: RRR no MRG Chest wall: Tender palpation anterior left chest wall. Abd: NABS, Soft. Nondistended, Nontender Exts: Brisk capillary refill, warm and well perfused. Nonedematous bilateral lower extremities. Left shoulder: Full range of motion negative impingement testing normal strength  Twelve-lead EKG shows normal sinus rhythm at 91 beats per minute. No ST segment elevation or depression. No Q waves. Not significantly changed from prior EKG.  No results found for this or any previous visit (from the past 24 hour(s)). Dg Chest 2 View  10/22/2013   CLINICAL DATA:  Chest pain,  shortness of breath, history of smoking  EXAM: CHEST  2 VIEW  COMPARISON:  03/28/2013  FINDINGS: Cardiomediastinal silhouette is stable. No acute infiltrate or pleural effusion. No pulmonary edema. Mild hyperinflation. Mild degenerative changes mid thoracic spine.  IMPRESSION: No acute infiltrate or pulmonary edema.  Mild hyperinflation.   Electronically Signed   By: Lahoma Crocker M.D.   On: 10/22/2013 11:53    Assessment and Plan: 33 y.o. female with costochondritis most likely explanation. Plan to followup with cardiology for further evaluation of this issue. Presented to the emergency room if worse. Plan to prescribe diclofenac or meloxicam. Patient has difficulty taking large pills. She will decide if she wants the meloxicam liquid or diclofenac tablets. Plan to additionally establish with a followup with a primary care provider.  Discussed warning signs or symptoms. Please see discharge instructions. Patient expresses understanding.   This note was created using Systems analyst. Any transcription errors are unintended.    Gregor Hams, MD 10/22/13 [phone]

## 2013-10-22 NOTE — Discharge Instructions (Signed)
Thank you for coming in today. Call or go to the emergency room if you get worse, have trouble breathing, have chest pains, or palpitations.  Take diclofenac as needed Followup with cardiology   Chest Wall Pain Chest wall pain is pain in or around the bones and muscles of your chest. It may take up to 6 weeks to get better. It may take longer if you must stay physically active in your work and activities.  CAUSES  Chest wall pain may happen on its own. However, it may be caused by:  A viral illness like the flu.  Injury.  Coughing.  Exercise.  Arthritis.  Fibromyalgia.  Shingles. HOME CARE INSTRUCTIONS   Avoid overtiring physical activity. Try not to strain or perform activities that cause pain. This includes any activities using your chest or your abdominal and side muscles, especially if heavy weights are used.  Put ice on the sore area.  Put ice in a plastic bag.  Place a towel between your skin and the bag.  Leave the ice on for 15-20 minutes per hour while awake for the first 2 days.  Only take over-the-counter or prescription medicines for pain, discomfort, or fever as directed by your caregiver. SEEK IMMEDIATE MEDICAL CARE IF:   Your pain increases, or you are very uncomfortable.  You have a fever.  Your chest pain becomes worse.  You have new, unexplained symptoms.  You have nausea or vomiting.  You feel sweaty or lightheaded.  You have a cough with phlegm (sputum), or you cough up blood. MAKE SURE YOU:   Understand these instructions.  Will watch your condition.  Will get help right away if you are not doing well or get worse. Document Released: 02/07/2005 Document Revised: 05/02/2011 Document Reviewed: 10/04/2010 [facility] Patient Information 2015 [place], [place]. This information is not intended to replace advice given to you by your health care provider. Make sure you discuss any questions you have with your health care provider.    PRIMARY  CARE Paramedic at [place], [place] [address] [phone]  Fax [phone]  Therapist, music at [facility] [address] [place], [place] Ph [phone]  Fax [phone]  Therapist, music at [place] / [place] [phone] [place], [place] Ph [phone]  Fax [phone]  [facility] at [place] [address] [place]  [place], [place] Ph [phone]  Fax [phone]  [place] [address] [place] [address] [place], [place] Ph [phone]  Fax [phone]  [facility] at [facility] [place], [place] Ph [phone]  Fax [phone]   [place] @ [place] [address] Phone: [phone]   [place] @ [facility] [address] [place] [address] Phone: [place] @ [place] [address] [place] [address] Phone: [place] @ [place]. [place] [address] Phone: [place] @ [place]. [address] [place], [place] [address] Phone: [phone]   [place] @ [place] [address] [place] [address] Phone: [phone]

## 2013-10-22 NOTE — ED Notes (Signed)
Transferred to [facility] radiology.

## 2013-10-22 NOTE — ED Notes (Signed)
Patient complains of left chest and left arm pain for 2-3 days.  Describes as pressure.  Initially intermittent, but now constant per patient.  Denies nausea, denies vomiting.  Reports pain is worse with deep inspiration or coughing. 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

## 2013-12-09 ENCOUNTER — Encounter: Payer: Self-pay | Admitting: Cardiovascular Disease

## 2013-12-09 ENCOUNTER — Ambulatory Visit (INDEPENDENT_AMBULATORY_CARE_PROVIDER_SITE_OTHER): Payer: 59 | Admitting: Cardiovascular Disease

## 2013-12-09 VITALS — BP 108/58 | HR 76 | Ht 67.0 in | Wt 163.8 lb

## 2013-12-09 DIAGNOSIS — R079 Chest pain, unspecified: Secondary | ICD-10-CM | POA: Insufficient documentation

## 2013-12-09 DIAGNOSIS — R072 Precordial pain: Secondary | ICD-10-CM

## 2013-12-09 DIAGNOSIS — F17200 Nicotine dependence, unspecified, uncomplicated: Secondary | ICD-10-CM | POA: Insufficient documentation

## 2013-12-09 NOTE — Patient Instructions (Signed)
Your physician recommends that you schedule a follow-up appointment in: [place] physician has requested that you have a stress echocardiogram. For further information please visit [email or web address]. Please follow instruction sheet as given.

## 2013-12-09 NOTE — Assessment & Plan Note (Signed)
Atypical  F/u stress echo since she has been to ER multiple times  NSAI's

## 2013-12-09 NOTE — Progress Notes (Signed)
Patient ID: Angela Hartman, female   DOB: 1980-11-06, 33 y.o.   MRN: [id number]   Angela Hartman is a 33 y.o. female who was seen at Urgent Care  for chest pain 10/22/13   Patient has left sided chest pain radiating to the left shoulder.  Sometimes feels like something is sitting on her ches.t  No pleurisy.  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  She was divorced but the father of her two boys died.  She currently has a boyfriend  Symptoms had been present  for 2-3 days. The pain is regular motion. She denies any exertional component palpitations or shortness of breath. She is taking BC powder which helps. No fevers or chills nausea vomiting or diarrhea. No injury.  Labs  And ECG normal Diagnosed with costochondritis       ROS: Denies fever, malais, weight loss, blurry vision, decreased visual acuity, cough, sputum, SOB, hemoptysis, pleuritic pain, palpitaitons, heartburn, abdominal pain, melena, lower extremity edema, claudication, or rash.  All other systems reviewed and negative   General: Affect appropriate Healthy:  appears stated age 25: normal Neck supple with no adenopathy JVP normal no bruits no thyromegaly Lungs clear with no wheezing and good diaphragmatic motion Heart:  S1/S2 no murmur,rub, gallop or click PMI normal Abdomen: benighn, BS positve, no tenderness, no AAA no bruit.  No HSM or HJR Distal pulses intact with no bruits No edema Neuro non-focal Skin warm and dry No muscular weakness  Medications Current Outpatient Prescriptions  Medication Sig Dispense Refill  . Aspirin-Caffeine 845-65 MG PACK Take by mouth.      . diclofenac (VOLTAREN) 50 MG EC tablet Take 1 tablet (50 mg total) by mouth 2 (two) times daily as needed.  30 tablet  0  . gabapentin (NEURONTIN) 250 MG/5ML solution 1 tsp (5 mL) daily for 3 days, 1 tsp BID for 3 days, 1 tsp TID thereafter  450 mL  0  . Meloxicam 7.5 MG/5ML SUSP Take 10 mLs (15 mg total)  by mouth daily as needed (pain).  150 mL  0   No current facility-administered medications for this visit.    Allergies Penicillins  Family History: Family History  Problem Relation Age of Onset  . Heart attack Father     Social History: History   Social History  . Marital Status: Legally Separated    Spouse Name: N/A    Number of Children: N/A  . Years of Education: N/A   Occupational History  . Not on file.   Social History Main Topics  . Smoking status: Current Every Day Smoker  . Smokeless tobacco: Not on file  . Alcohol Use: No  . Drug Use: No  . Sexual Activity: Not on file   Other Topics Concern  . Not on file   Social History Narrative  . No narrative on file    Electrocardiogram:  NSR rate 91  Normal   Assessment and Plan

## 2013-12-09 NOTE — Assessment & Plan Note (Signed)
Counseled for less than 10 minutes suggested 14 mg patch or gum  F/u primary Lung term risk of CA/COPD discussed

## 2013-12-23 ENCOUNTER — Ambulatory Visit (HOSPITAL_COMMUNITY): Payer: 59 | Attending: Cardiovascular Disease

## 2013-12-23 DIAGNOSIS — R072 Precordial pain: Secondary | ICD-10-CM

## 2013-12-23 DIAGNOSIS — R079 Chest pain, unspecified: Secondary | ICD-10-CM | POA: Insufficient documentation

## 2013-12-23 NOTE — Progress Notes (Signed)
Stress echo completed 12/23/2013

## 2014-03-28 ENCOUNTER — Encounter (HOSPITAL_COMMUNITY): Payer: Self-pay | Admitting: Emergency Medicine

## 2014-03-28 ENCOUNTER — Emergency Department (HOSPITAL_COMMUNITY)
Admission: EM | Admit: 2014-03-28 | Discharge: 2014-03-29 | Disposition: A | Discharge status: Home / Self Care | Payer: Commercial Managed Care - HMO | Attending: Emergency Medicine | Admitting: Emergency Medicine

## 2014-03-28 ENCOUNTER — Emergency Department (HOSPITAL_COMMUNITY): Payer: Commercial Managed Care - HMO

## 2014-03-28 DIAGNOSIS — R200 Anesthesia of skin: Secondary | ICD-10-CM | POA: Insufficient documentation

## 2014-03-28 DIAGNOSIS — R079 Chest pain, unspecified: Secondary | ICD-10-CM | POA: Diagnosis not present

## 2014-03-28 DIAGNOSIS — Z880 Allergy status to penicillin: Secondary | ICD-10-CM | POA: Insufficient documentation

## 2014-03-28 DIAGNOSIS — Z720 Tobacco use: Secondary | ICD-10-CM | POA: Insufficient documentation

## 2014-03-28 LAB — BASIC METABOLIC PANEL
ANION GAP: 12 (ref 5–15)
BUN: 11 mg/dL (ref 6–23)
CALCIUM: 8.7 mg/dL (ref 8.4–10.5)
CO2: 18 mmol/L — AB (ref 19–32)
Chloride: 109 mmol/L (ref 96–112)
Creatinine, Ser: 0.77 mg/dL (ref 0.50–1.10)
GFR calc Af Amer: >90 mL/min (ref >90)
GFR calc non Af Amer: >90 mL/min (ref >90)
Glucose, Bld: 149 mg/dL — ABNORMAL HIGH (ref 70–99)
POTASSIUM: 3.4 mmol/L — AB (ref 3.5–5.1)
Sodium: 139 mmol/L (ref 135–145)

## 2014-03-28 LAB — CBC
HCT: 35.3 % — ABNORMAL LOW (ref 36.0–46.0)
Hemoglobin: 11.5 g/dL — ABNORMAL LOW (ref 12.0–15.0)
MCH: 26.3 pg (ref 26.0–34.0)
MCHC: 32.6 g/dL (ref 30.0–36.0)
MCV: 80.8 fL (ref 78.0–100.0)
Platelets: 321 10*3/uL (ref 150–400)
RBC: 4.37 MIL/uL (ref 3.87–5.11)
RDW: 16.3 % — AB (ref 11.5–15.5)
WBC: 8.8 10*3/uL (ref 4.0–10.5)

## 2014-03-28 LAB — I-STAT TROPONIN, ED: Troponin i, poc: 0 ng/mL (ref 0.00–0.08)

## 2014-03-28 LAB — BRAIN NATRIURETIC PEPTIDE: B NATRIURETIC PEPTIDE 5: 27.1 pg/mL (ref 0.0–100.0)

## 2014-03-28 NOTE — ED Provider Notes (Signed)
CSN: [id number]     Arrival date & time 03/28/14  2220 History  This chart was scribed for Ephraim Hamburger, MD by Peyton Bottoms, ED Scribe. This patient was seen in room B18C/B18C and the patient's care was started at 11:58 PM.   Chief Complaint  Patient presents with  . Chest Pain   Patient is a 34 y.o. female presenting with chest pain. The history is provided by the patient. No language interpreter was used.  Chest Pain Pain location:  L chest Pain quality: aching, radiating and tightness   Pain radiates to:  L arm Pain radiates to the back: no   Pain severity:  Moderate Onset quality:  Gradual Duration:  2 days Timing:  Constant Progression:  Waxing and waning Chronicity:  New Context: breathing and movement   Relieved by:  Nothing Worsened by:  Nothing tried Ineffective treatments:  None tried Associated symptoms: numbness (fingers in left hand)   Associated symptoms: no abdominal pain and no fever     HPI Comments: Angela Hartman is a 34 y.o. female who presents to the Emergency Department complaining of moderate, gradually worsening chest tightness and left chest pain with radiation of pain to left arm that began 2 days ago. Patient states that the pain has been constant which waxes and wanes. She states that the pain worsens with deep breathing, coughing or movement of left arm. She also reports associated numbness to fingers of left hand. Patient reports similar chest pain in the past but states that she has not had radiating pain to left arm in the past. She reports family history of CAD. Patient reports that her father passed away at age 36 from CAD. She denies associated fever, rash, ecchymosis, leg swelling or blood clots. She denies recent travel. She denies use of birth control medication. She denies taking any medication prior to arrival.  Past Medical History  Diagnosis Date  . Chest pain    Past Surgical History  Procedure Laterality Date  . Cholecystectomy      Family History  Problem Relation Age of Onset  . Heart attack Father    History  Substance Use Topics  . Smoking status: Current Every Day Smoker  . Smokeless tobacco: Not on file  . Alcohol Use: No   OB History    No data available     Review of Systems  Constitutional: Negative for fever.  Respiratory: Positive for chest tightness.   Cardiovascular: Positive for chest pain. Negative for leg swelling.  Gastrointestinal: Negative for abdominal pain.  Neurological: Positive for numbness (fingers in left hand).  All other systems reviewed and are negative.  Allergies  Penicillins  Home Medications   Prior to Admission medications   Not on File   Triage Vitals: BP 109/59 mmHg  Pulse 86  Temp(Src) 98.3 F (36.8 C) (Oral)  Resp 14  Ht 5\' 7"  (1.702 m)  Wt 160 lb (72.576 kg)  BMI 25.05 kg/m2  SpO2 100%  LMP 03/27/2014  Physical Exam  Constitutional: She is oriented to person, place, and time. She appears well-developed and well-nourished. No distress.  HENT:  Head: Normocephalic and atraumatic.  Eyes: Right eye exhibits no discharge. Left eye exhibits no discharge.  Neck: Neck supple. No tracheal deviation present.  Cardiovascular: Normal rate, regular rhythm and normal heart sounds.   Pulses:      Radial pulses are 2+ on the right side, and 2+ on the left side.  Pulmonary/Chest: Effort normal and breath sounds  normal. No respiratory distress.  Point tenderness to left chest wall.  Abdominal: Soft. She exhibits no distension. There is no tenderness.  Musculoskeletal:  No leg tenderness or leg swelling  Neurological: She is alert and oriented to person, place, and time.  Skin: Skin is warm and dry.  Psychiatric: She has a normal mood and affect. Her behavior is normal.  Nursing note and vitals reviewed.  ED Course  Procedures (including critical care time)  DIAGNOSTIC STUDIES: Oxygen Saturation is 100% on RA, normal by my interpretation.    COORDINATION  OF CARE: 12:05 AM- Discussed plans to order diagnostic CXR and lab work. Will give patient Toradol 60mg . Pt advised of plan for treatment and pt agrees.  Labs Review Labs Reviewed  CBC - Abnormal; Notable for the following:    Hemoglobin 11.5 (*)    HCT 35.3 (*)    RDW 16.3 (*)    All other components within normal limits  BASIC METABOLIC PANEL - Abnormal; Notable for the following:    Potassium 3.4 (*)    CO2 18 (*)    Glucose, Bld 149 (*)    All other components within normal limits  BRAIN NATRIURETIC PEPTIDE  I-STAT TROPOININ, ED   Imaging Review Dg Chest 2 View  03/28/2014   CLINICAL DATA:  Chest pain, shortness of breath, cough. Pain radiates down left arm. Symptoms for 2 days.  EXAM: CHEST  2 VIEW  COMPARISON:  10/22/2013  FINDINGS: Borderline hyperinflation, stable from prior. The cardiomediastinal contours are normal. The lungs are clear. Pulmonary vasculature is normal. No consolidation, pleural effusion, or pneumothorax. No acute osseous abnormalities are seen.  IMPRESSION: Stable mild hyperinflation without localizing pulmonary process.   Electronically Signed   By: Jeb Levering M.D.   On: 03/28/2014 23:11    EKG Interpretation   Date/Time:  Friday March 28 2014 22:27:08 EST Ventricular Rate:  99 PR Interval:  138 QRS Duration: 82 QT Interval:  334 QTC Calculation: 428 R Axis:   104 Text Interpretation:  Normal sinus rhythm Rightward axis Borderline ECG no  acute ST/T changes no significant change since Sept 2015 Confirmed by  Regenia Skeeter  MD, Jasim Harari ([id number]) on 03/28/2014 11:47:29 PM     MDM   Final diagnoses:  Chest pain  SOB (shortness of breath)    Patient with 3 days of left-sided chest pain that seems consistent with musculoskeletal etiology. Pain is worse with multiple types of movements and palpation. She's had this before. She's currently low risk for ACS, only risk factor appears to be smoking. Patient's symptoms are not consistent with pulmonary  embolism and she is low-risk for this as well, given she has a negative PERC score, she seems ruled out for PE. Given length of symptoms, continuous pain for over 24 hours, I do not feel a second troponin as needed. Will refer to a PCP, discussed return precautions.  I personally performed the services described in this documentation, which was scribed in my presence. The recorded information has been reviewed and is accurate.   Ephraim Hamburger, MD 03/29/14 [phone]

## 2014-03-28 NOTE — ED Notes (Signed)
Pt. reports intermittent left chest pain " tightness " radiating to left arm onset 3 days ago with SOB and occasional productive cough , denies nausea or diaphoresis .

## 2014-03-29 MED ORDER — KETOROLAC TROMETHAMINE 60 MG/2ML IM SOLN
60.0000 mg | Freq: Once | INTRAMUSCULAR | Status: AC
  Administered 2014-03-29: 60 mg via INTRAMUSCULAR
  Filled 2014-03-29: qty 2

## 2014-03-29 NOTE — Discharge Instructions (Signed)
Chest Pain (Nonspecific) °It is often hard to give a specific diagnosis for the cause of chest pain. There is always a chance that your pain could be related to something serious, such as a heart attack or a blood clot in the lungs. You need to follow up with your health care provider for further evaluation. °CAUSES  °· Heartburn. °· Pneumonia or bronchitis. °· Anxiety or stress. °· Inflammation around your heart (pericarditis) or lung (pleuritis or pleurisy). °· A blood clot in the lung. °· A collapsed lung (pneumothorax). It can develop suddenly on its own (spontaneous pneumothorax) or from trauma to the chest. °· Shingles infection (herpes zoster virus). °The chest wall is composed of bones, muscles, and cartilage. Any of these can be the source of the pain. °· The bones can be bruised by injury. °· The muscles or cartilage can be strained by coughing or overwork. °· The cartilage can be affected by inflammation and become sore (costochondritis). °DIAGNOSIS  °Lab tests or other studies may be needed to find the cause of your pain. Your health care provider may have you take a test called an ambulatory electrocardiogram (ECG). An ECG records your heartbeat patterns over a 24-hour period. You may also have other tests, such as: °· Transthoracic echocardiogram (TTE). During echocardiography, sound waves are used to evaluate how blood flows through your heart. °· Transesophageal echocardiogram (TEE). °· Cardiac monitoring. This allows your health care provider to monitor your heart rate and rhythm in real time. °· Holter monitor. This is a portable device that records your heartbeat and can help diagnose heart arrhythmias. It allows your health care provider to track your heart activity for several days, if needed. °· Stress tests by exercise or by giving medicine that makes the heart beat faster. °TREATMENT  °· Treatment depends on what may be causing your chest pain. Treatment may include: °· Acid blockers for  heartburn. °· Anti-inflammatory medicine. °· Pain medicine for inflammatory conditions. °· Antibiotics if an infection is present. °· You may be advised to change lifestyle habits. This includes stopping smoking and avoiding alcohol, caffeine, and chocolate. °· You may be advised to keep your head raised (elevated) when sleeping. This reduces the chance of acid going backward from your stomach into your esophagus. °Most of the time, nonspecific chest pain will improve within 2-3 days with rest and mild pain medicine.  °HOME CARE INSTRUCTIONS  °· If antibiotics were prescribed, take them as directed. Finish them even if you start to feel better. °· For the next few days, avoid physical activities that bring on chest pain. Continue physical activities as directed. °· Do not use any tobacco products, including cigarettes, chewing tobacco, or electronic cigarettes. °· Avoid drinking alcohol. °· Only take medicine as directed by your health care provider. °· Follow your health care provider's suggestions for further testing if your chest pain does not go away. °· Keep any follow-up appointments you made. If you do not go to an appointment, you could develop lasting (chronic) problems with pain. If there is any problem keeping an appointment, call to reschedule. °SEEK MEDICAL CARE IF:  °· Your chest pain does not go away, even after treatment. °· You have a rash with blisters on your chest. °· You have a fever. °SEEK IMMEDIATE MEDICAL CARE IF:  °· You have increased chest pain or pain that spreads to your arm, neck, jaw, back, or abdomen. °· You have shortness of breath. °· You have an increasing cough, or you cough   up blood.  You have severe back or abdominal pain.  You feel nauseous or vomit.  You have severe weakness.  You faint.  You have chills. This is an emergency. Do not wait to see if the pain will go away. Get medical help at once. Call your local emergency services (911 in [place] Do not drive  yourself to the hospital. MAKE SURE YOU:   Understand these instructions.  Will watch your condition.  Will get help right away if you are not doing well or get worse. Document Released: 11/17/2004 Document Revised: 02/12/2013 Document Reviewed: 09/13/2007 [facility] Patient Information 2015 [place], [place]. This information is not intended to replace advice given to you by your health care provider. Make sure you discuss any questions you have with your health care provider.    Chest Wall Pain Chest wall pain is pain in or around the bones and muscles of your chest. It may take up to 6 weeks to get better. It may take longer if you must stay physically active in your work and activities.  CAUSES  Chest wall pain may happen on its own. However, it may be caused by:  A viral illness like the flu.  Injury.  Coughing.  Exercise.  Arthritis.  Fibromyalgia.  Shingles. HOME CARE INSTRUCTIONS   Avoid overtiring physical activity. Try not to strain or perform activities that cause pain. This includes any activities using your chest or your abdominal and side muscles, especially if heavy weights are used.  Put ice on the sore area.  Put ice in a plastic bag.  Place a towel between your skin and the bag.  Leave the ice on for 15-20 minutes per hour while awake for the first 2 days.  Only take over-the-counter or prescription medicines for pain, discomfort, or fever as directed by your caregiver. SEEK IMMEDIATE MEDICAL CARE IF:   Your pain increases, or you are very uncomfortable.  You have a fever.  Your chest pain becomes worse.  You have new, unexplained symptoms.  You have nausea or vomiting.  You feel sweaty or lightheaded.  You have a cough with phlegm (sputum), or you cough up blood. MAKE SURE YOU:   Understand these instructions.  Will watch your condition.  Will get help right away if you are not doing well or get worse. Document Released: 02/07/2005  Document Revised: 05/02/2011 Document Reviewed: 10/04/2010 [facility] Patient Information 2015 [place], [place]. This information is not intended to replace advice given to you by your health care provider. Make sure you discuss any questions you have with your health care provider.     Emergency Department Resource Guide 1) Find a Doctor and Pay Out of Pocket Although you won't have to find out who is covered by your insurance plan, it is a good idea to ask around and get recommendations. You will then need to call the office and see if the doctor you have chosen will accept you as a new patient and what types of options they offer for patients who are self-pay. Some doctors offer discounts or will set up payment plans for their patients who do not have insurance, but you will need to ask so you aren't surprised when you get to your appointment.  2) Contact Your Local Health Department Not all health departments have doctors that can see patients for sick visits, but many do, so it is worth a call to see if yours does. If you don't know where your local health department is, you  can check in your phone book. The CDC also has a tool to help you locate your state's health department, and many state websites also have listings of all of their local health departments.  3) Find a [facility] If your illness is not likely to be very severe or complicated, you may want to try a walk in clinic. These are popping up all over the country in pharmacies, drugstores, and shopping centers. They're usually staffed by nurse practitioners or physician assistants that have been trained to treat common illnesses and complaints. They're usually fairly quick and inexpensive. However, if you have serious medical issues or chronic medical problems, these are probably not your best option.  No Primary Care Doctor: - Call Health Connect at  [phone] - they can help you locate a primary care doctor that  accepts your  insurance, provides certain services, etc. - Physician Referral Service- [phone]  Chronic Pain Problems: Organization         Address  Phone   Notes  [facility]  [phone] Patients need to be referred by their primary care doctor.   Medication Assistance: Organization         Address  Phone   Notes  [facility] Medication [facility] [address] [place], [place] [address] [phone] --Must be a resident of [facility] -- Must have NO insurance coverage whatsoever (no Medicaid/ Medicare, etc.) -- The pt. MUST have a primary care doctor that directs their care regularly and follows them in the community   MedAssist  [phone]   [employer]  [phone]    Agencies that provide inexpensive medical care: Organization         Address  Phone   Notes  [place]  [phone]   [facility]    [phone]   [facility] [place], [place] [address] [phone]   [place] [address], [place] [phone]   Planned Parenthood    [phone]   [facility]    [phone]   [place] and [address], [place] Phone:  [phone], Fax:  [phone] Hours of Operation:  9 am - 6 pm, M-F.  Also accepts Medicaid/Medicare and self-pay.  [facility] for [place], [address], [place] Phone: [phone], Fax: [phone]. Hours of Operation:  8:30 am - 5:30 pm, M-F.  Also accepts Medicaid and self-pay.  [facility] [place] [address] [place] Phone: [phone]   [place], [place], [place] [phone], Ext. [phone] Mondays & Thursdays: 7-9 AM.  First 15 patients are seen on a first come, first serve basis.    [place] Providers:  Organization          Address  Phone   Notes  [facility] [address], [place] [phone] Also accepts self-pay patients.  [facility] [address], [place]  [phone]   [place], [address], [place] [phone]   [facility] [address] [place] [phone]   Lucianne Lei [address], [place]   [phone] Only accepts [place] Access [place] patients after they have their name applied  to their card.   Self-Pay (no insurance) in [facility]:  Organization         Address  Phone   Notes  Sickle Cell Patients, [facility] [place] [phone]   [facility] [place] [phone]   [facility] [place]  [place], [place], [place] [phone]   [facility]/Dr. Osei-Bonsu  [address] [place] or [address], [place] [phone] Phone number for both [place] and [place] locations is the same.  [facility] [address], [place] [phone]   [facility] [address] [place] or [address] [phone]   [facility] [address], [place] [phone], phone; [phone], fax Sees patients 1st and 3rd Saturday of every month.  Must not qualify for public or private insurance (i.e. Medicaid, Medicare, [place] Health Choice, Veterans' Benefits)  Household income should be no more than 200% of the poverty level The clinic cannot treat you if you are pregnant or think you are pregnant  Sexually transmitted diseases are not treated at the clinic.    Dental Care: Organization         Address  Phone  Notes  [facility] Department of [facility] [place] [phone] Accepts children up to age 44 who are enrolled in [place] or [place]; pregnant women with a Medicaid card; and children who have applied for Medicaid or [place] Health Choice, but were declined, whose parents can pay a reduced fee at time of service.  [facility] Department of [facility]  [address], [place] [phone] Accepts children up to age 34 who are enrolled in [place] or [place]; pregnant women with a Medicaid card; and children who have applied for Medicaid or [place] Health Choice, but were declined, whose parents can pay a reduced fee at time of service.  [place] Adult Dental Access PROGRAM  [place] [phone] Patients are seen by appointment only. Walk-ins are not accepted. [place] will see patients 85 years of age and older. Monday - Tuesday (8am-5pm) Most Wednesdays (8:30-5pm) $30 per visit, cash only  [facility] Adult Dental Access PROGRAM  [address], [facility] [phone] Patients are seen by appointment only. Walk-ins are not accepted. [place] will see patients 29 years of age and older. One Wednesday Evening (Monthly: Volunteer Based).  $30 per visit, cash only  [place]  [phone] for adults; Children under age 52, call Graduate Pediatric Dentistry at [phone]. Children aged 6-14, please call [phone] to request a pediatric application.  Dental services are provided in all areas of dental care including fillings, crowns and bridges, complete and partial dentures, implants, gum treatment, root canals, and extractions. Preventive care is also provided. Treatment is provided to both adults and children. Patients are selected via a lottery and there is often a waiting list.   [facility] [address], [place]  [phone] [email or web address]   [facility] [address] [place], [place]  [phone], Ext. [phone] Second and Fourth Thursday of each month, opens at 6:30 AM; Clinic ends at 9 AM.  Patients are seen on a first-come first-served  basis, and a limited number are seen during each clinic.   [facility]  [address] Hillard Danker [place], [place] [phone]   Eligibility Requirements You must have lived in [place], [place], or [place] counties for at least the last three months.   You cannot be eligible for state or federal sponsored [employer], including [employer], [place], or [employer].   You generally cannot be eligible for healthcare insurance through your employer.    How to apply: Eligibility screenings are held every Tuesday and Wednesday afternoon from 1:00 pm until 4:00 pm. You do not need an appointment for the interview!  [facility] [address] [place], [place]   [place]  [place] Department  [place]  [phone]    Behavioral Health Resources in the Community: Intensive Outpatient Programs Organization         Address  Phone  Notes  [place]. [address] [place], [place] [phone]   [facility] [address], [place], [place]   ADS: Alcohol & Drug Svcs [address], [place], [place]   [place] [address]  [place], [place] or [phone]   Substance Abuse Resources Organization         Address  Phone  Notes  Alcohol and Drug Services  [phone]   [place]  [phone]   The [place]   Chinita Pester  [phone]   Residential & Outpatient Substance Abuse Program  [phone]   Psychological Services Organization         Address  Phone  Notes  [facility] [place]  [place]  [phone]    [place] [address], [place] or [phone]    Mobile Crisis Teams Organization         Address  Phone  Notes  [facility], Mobile Crisis Care Unit  [phone]   [facility]  [address] [place], [place]   [place] [address], [place] [phone]    Self-Help/Support Groups Organization         Address  Phone             Notes  [place]. of [place] - variety of support groups  [place] Call for more information  Narcotics Anonymous (NA), [facility] [address], [employer] [place]  2 meetings at this location   Special educational needs teacher         Address  Phone  Notes  ASAP Residential Treatment [place],    [place]  [phone]) [phone]   [facility]  [address], [place] [id number], [place], [place]   [place], [place] [phone] Admissions: 8am-3pm M-F  Incentives Substance [place] [address],    [place], [place] [phone]   [facility] [address] Jadene Pierini [place], [place]   [facility] [address],  [place], [place]   [facility] Programs - Intensive Outpatient [address], [place], [employer] [place]   [facility] ([place] [address].,  [place], [place] or [phone]   [facility]idential Treatment Services (RTS) [address], [place], [place] Accepts Medicaid  [facility] [address]  [place] [phone] Substance Abuse/Addiction Treatment   [facility] Organization         Address  Phone  Notes  [facility]  [phone]   Domenic Schwab, PhD [address] [place], [place]   [phone] or [phone]   [place]  [place], [place] [phone]   [place] [address], [place], [place] [phone] Insurance/Medicaid/sponsorship through [facility] Families [address], [address] [place]                                    [place], [place] [phone] [place] [address], [place] [phone]    Dr. Adele Schilder  [phone]   [facility] Dept. 1) [address]  2) [place] 3)  [place] [address], [place] [phone]  [phone]   [place] [phone] or [phone] (After Hours)

## 2014-10-30 ENCOUNTER — Encounter (HOSPITAL_COMMUNITY): Payer: Self-pay | Admitting: Emergency Medicine

## 2014-10-30 ENCOUNTER — Emergency Department (HOSPITAL_COMMUNITY)
Admission: EM | Admit: 2014-10-30 | Discharge: 2014-10-30 | Disposition: A | Discharge status: Home / Self Care | Payer: Worker's Compensation | Attending: Emergency Medicine | Admitting: Emergency Medicine

## 2014-10-30 ENCOUNTER — Emergency Department (HOSPITAL_COMMUNITY): Payer: Worker's Compensation

## 2014-10-30 DIAGNOSIS — S52122A Displaced fracture of head of left radius, initial encounter for closed fracture: Secondary | ICD-10-CM | POA: Diagnosis not present

## 2014-10-30 DIAGNOSIS — Z880 Allergy status to penicillin: Secondary | ICD-10-CM | POA: Diagnosis not present

## 2014-10-30 DIAGNOSIS — Z720 Tobacco use: Secondary | ICD-10-CM | POA: Insufficient documentation

## 2014-10-30 DIAGNOSIS — S59902A Unspecified injury of left elbow, initial encounter: Secondary | ICD-10-CM | POA: Diagnosis present

## 2014-10-30 DIAGNOSIS — Y9389 Activity, other specified: Secondary | ICD-10-CM | POA: Insufficient documentation

## 2014-10-30 DIAGNOSIS — Y92481 Parking lot as the place of occurrence of the external cause: Secondary | ICD-10-CM | POA: Diagnosis not present

## 2014-10-30 DIAGNOSIS — W010XXA Fall on same level from slipping, tripping and stumbling without subsequent striking against object, initial encounter: Secondary | ICD-10-CM | POA: Insufficient documentation

## 2014-10-30 DIAGNOSIS — Y998 Other external cause status: Secondary | ICD-10-CM | POA: Diagnosis not present

## 2014-10-30 MED ORDER — OXYCODONE-ACETAMINOPHEN 5-325 MG PO TABS: 1.0000 | ORAL_TABLET | ORAL | Status: DC | PRN

## 2014-10-30 MED ORDER — OXYCODONE-ACETAMINOPHEN 5-325 MG PO TABS
ORAL_TABLET | ORAL | Status: AC
  Filled 2014-10-30: qty 1

## 2014-10-30 MED ORDER — OXYCODONE-ACETAMINOPHEN 5-325 MG PO TABS
1.0000 | ORAL_TABLET | Freq: Once | ORAL | Status: AC
  Administered 2014-10-30: 1 via ORAL

## 2014-10-30 MED ORDER — IBUPROFEN 800 MG PO TABS
800.0000 mg | ORAL_TABLET | Freq: Once | ORAL | Status: AC
  Administered 2014-10-30: 800 mg via ORAL
  Filled 2014-10-30: qty 1

## 2014-10-30 NOTE — ED Provider Notes (Signed)
CSN: [id number]     Arrival date & time 10/30/14  0125 History   First MD Initiated Contact with Patient 10/30/14 0400     Chief Complaint  Patient presents with  . Fall     (Consider location/radiation/quality/duration/timing/severity/associated sxs/prior Treatment) Patient is a 34 y.o. female presenting with fall. The history is provided by the patient.  Fall  She tripped and fell in a parking lot injuring her left elbow. She is complaining of pain from her left shoulder to her left wrist. Pain is worse with any movement. Pain is better if she holds still. Denies weakness, numbness, tingling. She denies other injury.  Past Medical History  Diagnosis Date  . Chest pain    Past Surgical History  Procedure Laterality Date  . Cholecystectomy     Family History  Problem Relation Age of Onset  . Heart attack Father    Social History  Substance Use Topics  . Smoking status: Current Every Day Smoker  . Smokeless tobacco: None  . Alcohol Use: No   OB History    No data available     Review of Systems  All other systems reviewed and are negative.     Allergies  Penicillins  Home Medications   Prior to Admission medications   Not on File   BP 109/65 mmHg  Pulse 77  Temp(Src) 98.8 F (37.1 C) (Oral)  Resp 16  Ht 5\' 7"  (1.702 m)  Wt 173 lb 3.2 oz (78.563 kg)  BMI 27.12 kg/m2  SpO2 99%  LMP 10/23/2014 Physical Exam  Nursing note and vitals reviewed.  34 year old female, resting comfortably and in no acute distress. Vital signs are normal. Oxygen saturation is 99%, which is normal. Head is normocephalic and atraumatic. PERRLA, EOMI. Oropharynx is clear. Neck is nontender and supple without adenopathy or JVD. Back is nontender and there is no CVA tenderness. Lungs are clear without rales, wheezes, or rhonchi. Chest is nontender. Heart has regular rate and rhythm without murmur. Abdomen is soft, flat, nontender without masses or hepatosplenomegaly and  peristalsis is normoactive. Extremities: There is mild swelling around the left elbow with tenderness diffusely around the elbow. Maximum tenderness is over the radial head. There is pain with any movement of the elbow. There is no tenderness to palpation over the left wrist, forearm, shoulder. Distal neurovascular exam is intact with strong pulses, prompt capillary refill, normal sensation, normal movement. Skin is warm and dry without rash. Neurologic: Mental status is normal, cranial nerves are intact, there are no motor or sensory deficits.  ED Course  SPLINT APPLICATION Date/Time: 1/0/2725 4:17 AM Performed by: Delora Fuel Authorized by: Roxanne Mins, Melaney Tellefsen Consent: Verbal consent obtained. Written consent not obtained. Risks and benefits: risks, benefits and alternatives were discussed Consent given by: patient Patient understanding: patient states understanding of the procedure being performed Patient consent: the patient's understanding of the procedure matches consent given Procedure consent: procedure consent matches procedure scheduled Relevant documents: relevant documents present and verified Site marked: the operative site was marked Imaging studies: imaging studies available Required items: required blood products, implants, devices, and special equipment available Patient identity confirmed: verbally with patient and arm band Time out: Immediately prior to procedure a "time out" was called to verify the correct patient, procedure, equipment, support staff and site/side marked as required. Location details: left arm Splint type: long arm Supplies used: cotton padding,  elastic bandage and Ortho-Glass Post-procedure: The splinted body part was neurovascularly unchanged following the procedure. Patient tolerance:  Patient tolerated the procedure well with no immediate complications Comments: Splint applied by orthopedic technician. Neurovascular status checked by me following splint  application.   (including critical care time)  Imaging Review Dg Elbow Complete Left  10/30/2014   CLINICAL DATA:  Patient fell tonight, landing on the left elbow. Posterior elbow pain.  EXAM: LEFT ELBOW - COMPLETE 3+ VIEW  COMPARISON:  None.  FINDINGS: There is a left elbow effusion with elevation of the anterior and posterior fat pads. Vague linear lucency along the radial head suggests a nondisplaced fracture. No evidence of dislocation of the elbow. No destructive bone lesions.  IMPRESSION: Left elbow effusion. Probable nondisplaced fracture of the left radial head.   Electronically Signed   By: Lucienne Capers M.D.   On: 10/30/2014 01:57   I have personally reviewed and evaluated these images as part of my medical decision-making.   MDM   Final diagnoses:  Fall from slip, trip, or stumble, initial encounter  Closed fracture of radial head, left, initial encounter    Left radial head fracture treated with posterior splint and sling. She is discharged with prescription for oxycodone-acetaminophen and referred to orthopedics for follow-up.    Delora Fuel, MD [id number]

## 2014-10-30 NOTE — ED Notes (Signed)
Pt. tripped and fell this evening , presents with left elbow pain/mild swelling .

## 2014-10-30 NOTE — Discharge Instructions (Signed)
Take ibuprofen or acetaminophen as needed for less severe pain.  Radial Head Fracture A radial head fracture is a break of the smaller bone (radius) in the forearm. The head of this bone is the part near the elbow. These fractures commonly happen during a fall, when you land on an outstretched arm. These fractures are more common in middle aged adults and are common with a dislocation of the elbow. SYMPTOMS   Swelling of the elbow joint and pain on the outside of the elbow.  Pain and difficulty in bending or straightening the elbow.  Pain and difficulty in turning the palm of the hand up or down with the elbow bent. DIAGNOSIS  Your caregiver may make this diagnosis by a physical exam. X-rays can confirm the type and amount of fracture. Sometimes a fracture that is not displaced cannot be seen on the original X-ray. TREATMENT  Radial head fractures are classified according to the amount of movement (displacement) of parts from the normal position.  Type 1 Fractures  Type 1 fractures are generally small fractures in which bone pieces remain together (nondisplaced fracture).  The fracture may not be seen on initial X-rays. Usually if X-rays are repeated two to three weeks later, the fracture will show up. A splint or sling is used for a few days. Gentle early motion is used to prevent the elbow from becoming stiff. It should not be done vigorously or forced as this could displace the bone pieces. Type 2 Fractures  With type 2 fractures, bone pieces are slightly displaced and larger pieces of bone are broken off.  If only a little displacement of the bone piece is present, splinting for 4 to 5 days usually works well. This is again followed with gentle active range of motion. Small fragments may be surgically removed.  Large pieces of bone that can be put back into place will sometimes be fixed with pins or screws to hold them until the bone is healed. If this cannot be done, the fragments are  removed. For older, less active people, sometimes the entire radial head is removed if the wrist is not injured. The elbow and arm will still work fine. Soft tissue, tendon, and ligament injuries are corrected at the same time. Type 3 Fractures  Type 3 fractures have multiple broken pieces of bone that cannot be fixed. Surgery is usually needed to remove the broken bits of bone and what is left of the radial head. Soft-tissue damage is repaired. Gentle early motion is used to prevent the elbow from becoming stiff. Sometimes an artificial radial head can be used to prevent deformity if the elbow is unstable. Rest, ice, elevation, immobilization, medications, and pain control are used in the early care. HOME CARE INSTRUCTIONS   Keep the injured part elevated while sitting or lying down. Keep the injury above the level of your heart (the center of the chest). This will decrease swelling and pain.  Apply ice to the injury for 15-20 minutes, 03-04 times per day while awake, for 2 days. Put the ice in a plastic bag and place a towel between the bag of ice and your cast or splint.  Move your fingers to avoid stiffness and minimize swelling.  If you have a plaster or fiberglass cast:  Do not try to scratch the skin under the cast using sharp or pointed objects.  Check the skin around the cast every day. You may put lotion on any red or sore areas.  Keep your  cast dry and clean.  If you have a plaster splint:  Wear the splint as directed.  You may loosen the elastic around the splint if your fingers become numb, tingle, or turn cold or blue.  Do not put pressure on any part of your cast or splint. It may break. Rest your cast only on a pillow for the first 24 hours until it is fully hardened.  Your cast or splint can be protected during bathing with a plastic bag. Do not lower the cast or splint into the water.  Only take over-the-counter or prescription medicines for pain, discomfort, or fever  as directed by your caregiver.  Follow all instructions for follow-up with your caregiver. This includes any orthopedic referrals, physical therapy, and rehabilitation. Any delay in obtaining necessary care could result in a delay or failure of the bones to heal or permanent elbow stiffness.  Do not overdo exercises. This could further damage your injury. SEEK IMMEDIATE MEDICAL CARE IF:   Your cast or splint gets damaged or breaks.  You have more severe pain or swelling than you did before getting the cast.  You have severe pain when stretching your fingers.  There is a bad smell, new stains, and/or pus-like (purulent) drainage coming from under the cast.  Your fingers or hand turn pale or blue, become cold, or you lose feeling. Document Released: 11/29/2005 Document Revised: 06/24/2013 Document Reviewed: 01/06/2009 [facility] Patient Information 2015 [place], [place]. This information is not intended to replace advice given to you by your health care provider. Make sure you discuss any questions you have with your health care provider.   Cast or Splint Care Casts and splints support injured limbs and keep bones from moving while they heal. It is important to care for your cast or splint at home.  HOME CARE INSTRUCTIONS  Keep the cast or splint uncovered during the drying period. It can take 24 to 48 hours to dry if it is made of plaster. A fiberglass cast will dry in less than 1 hour.  Do not rest the cast on anything harder than a pillow for the first 24 hours.  Do not put weight on your injured limb or apply pressure to the cast until your health care provider gives you permission.  Keep the cast or splint dry. Wet casts or splints can lose their shape and may not support the limb as well. A wet cast that has lost its shape can also create harmful pressure on your skin when it dries. Also, wet skin can become infected.  Cover the cast or splint with a plastic bag when bathing or when  out in the rain or snow. If the cast is on the trunk of the body, take sponge baths until the cast is removed.  If your cast does become wet, dry it with a towel or a blow dryer on the cool setting only.  Keep your cast or splint clean. Soiled casts may be wiped with a moistened cloth.  Do not place any hard or soft foreign objects under your cast or splint, such as cotton, toilet paper, lotion, or powder.  Do not try to scratch the skin under the cast with any object. The object could get stuck inside the cast. Also, scratching could lead to an infection. If itching is a problem, use a blow dryer on a cool setting to relieve discomfort.  Do not trim or cut your cast or remove padding from inside of it.  Exercise all  joints next to the injury that are not immobilized by the cast or splint. For example, if you have a long leg cast, exercise the hip joint and toes. If you have an arm cast or splint, exercise the shoulder, elbow, thumb, and fingers.  Elevate your injured arm or leg on 1 or 2 pillows for the first 1 to 3 days to decrease swelling and pain.It is best if you can comfortably elevate your cast so it is higher than your heart. SEEK MEDICAL CARE IF:   Your cast or splint cracks.  Your cast or splint is too tight or too loose.  You have unbearable itching inside the cast.  Your cast becomes wet or develops a soft spot or area.  You have a bad smell coming from inside your cast.  You get an object stuck under your cast.  Your skin around the cast becomes red or raw.  You have new pain or worsening pain after the cast has been applied. SEEK IMMEDIATE MEDICAL CARE IF:   You have fluid leaking through the cast.  You are unable to move your fingers or toes.  You have discolored (blue or white), cool, painful, or very swollen fingers or toes beyond the cast.  You have tingling or numbness around the injured area.  You have severe pain or pressure under the cast.  You have  any difficulty with your breathing or have shortness of breath.  You have chest pain. Document Released: 02/05/2000 Document Revised: 11/28/2012 Document Reviewed: 08/16/2012 [facility] Patient Information 2015 [place], [place]. This information is not intended to replace advice given to you by your health care provider. Make sure you discuss any questions you have with your health care provider.  Acetaminophen; Oxycodone tablets What is this medicine? ACETAMINOPHEN; OXYCODONE (a set a MEE noe fen; ox i KOE done) is a pain reliever. It is used to treat mild to moderate pain. This medicine may be used for other purposes; ask your health care provider or pharmacist if you have questions. COMMON BRAND NAME(S): Endocet, Magnacet, Narvox, Percocet, Perloxx, Primalev, Primlev, Roxicet, Xolox What should I tell my health care provider before I take this medicine? They need to know if you have any of these conditions: -brain tumor -Crohn's disease, inflammatory bowel disease, or ulcerative colitis -drug abuse or addiction -head injury -heart or circulation problems -if you often drink alcohol -kidney disease or problems going to the bathroom -liver disease -lung disease, asthma, or breathing problems -an unusual or allergic reaction to acetaminophen, oxycodone, other opioid analgesics, other medicines, foods, dyes, or preservatives -pregnant or trying to get pregnant -breast-feeding How should I use this medicine? Take this medicine by mouth with a full glass of water. Follow the directions on the prescription label. Take your medicine at regular intervals. Do not take your medicine more often than directed. Talk to your pediatrician regarding the use of this medicine in children. Special care may be needed. Patients over 32 years old may have a stronger reaction and need a smaller dose. Overdosage: If you think you have taken too much of this medicine contact a poison control center or emergency  room at once. NOTE: This medicine is only for you. Do not share this medicine with others. What if I miss a dose? If you miss a dose, take it as soon as you can. If it is almost time for your next dose, take only that dose. Do not take double or extra doses. What may interact with this medicine? -  alcohol -antihistamines -barbiturates like amobarbital, butalbital, butabarbital, methohexital, pentobarbital, phenobarbital, thiopental, and secobarbital -benztropine -drugs for bladder problems like solifenacin, trospium, oxybutynin, tolterodine, hyoscyamine, and methscopolamine -drugs for breathing problems like ipratropium and tiotropium -drugs for certain stomach or intestine problems like propantheline, homatropine methylbromide, glycopyrrolate, atropine, belladonna, and dicyclomine -general anesthetics like etomidate, ketamine, nitrous oxide, propofol, desflurane, enflurane, halothane, isoflurane, and sevoflurane -medicines for depression, anxiety, or psychotic disturbances -medicines for sleep -muscle relaxants -naltrexone -narcotic medicines (opiates) for pain -phenothiazines like perphenazine, thioridazine, chlorpromazine, mesoridazine, fluphenazine, prochlorperazine, promazine, and trifluoperazine -scopolamine -tramadol -trihexyphenidyl This list may not describe all possible interactions. Give your health care provider a list of all the medicines, herbs, non-prescription drugs, or dietary supplements you use. Also tell them if you smoke, drink alcohol, or use illegal drugs. Some items may interact with your medicine. What should I watch for while using this medicine? Tell your doctor or health care professional if your pain does not go away, if it gets worse, or if you have new or a different type of pain. You may develop tolerance to the medicine. Tolerance means that you will need a higher dose of the medication for pain relief. Tolerance is normal and is expected if you take this  medicine for a long time. Do not suddenly stop taking your medicine because you may develop a severe reaction. Your body becomes used to the medicine. This does NOT mean you are addicted. Addiction is a behavior related to getting and using a drug for a non-medical reason. If you have pain, you have a medical reason to take pain medicine. Your doctor will tell you how much medicine to take. If your doctor wants you to stop the medicine, the dose will be slowly lowered over time to avoid any side effects. You may get drowsy or dizzy. Do not drive, use machinery, or do anything that needs mental alertness until you know how this medicine affects you. Do not stand or sit up quickly, especially if you are an older patient. This reduces the risk of dizzy or fainting spells. Alcohol may interfere with the effect of this medicine. Avoid alcoholic drinks. There are different types of narcotic medicines (opiates) for pain. If you take more than one type at the same time, you may have more side effects. Give your health care provider a list of all medicines you use. Your doctor will tell you how much medicine to take. Do not take more medicine than directed. Call emergency for help if you have problems breathing. The medicine will cause constipation. Try to have a bowel movement at least every 2 to 3 days. If you do not have a bowel movement for 3 days, call your doctor or health care professional. Do not take Tylenol (acetaminophen) or medicines that have acetaminophen with this medicine. Too much acetaminophen can be very dangerous. Many nonprescription medicines contain acetaminophen. Always read the labels carefully to avoid taking more acetaminophen. What side effects may I notice from receiving this medicine? Side effects that you should report to your doctor or health care professional as soon as possible: -allergic reactions like skin rash, itching or hives, swelling of the face, lips, or tongue -breathing  difficulties, wheezing -confusion -light headedness or fainting spells -severe stomach pain -unusually weak or tired -yellowing of the skin or the whites of the eyes Side effects that usually do not require medical attention (report to your doctor or health care professional if they continue or are bothersome): -dizziness -drowsiness -nausea -vomiting This  list may not describe all possible side effects. Call your doctor for medical advice about side effects. You may report side effects to FDA at 1-800-FDA-1088. Where should I keep my medicine? Keep out of the reach of children. This medicine can be abused. Keep your medicine in a safe place to protect it from theft. Do not share this medicine with anyone. Selling or giving away this medicine is dangerous and against the law. Store at room temperature between 20 and 25 degrees C (68 and 77 degrees F). Keep container tightly closed. Protect from light. This medicine may cause accidental overdose and death if it is taken by other adults, children, or pets. Flush any unused medicine down the toilet to reduce the chance of harm. Do not use the medicine after the expiration date. NOTE: This sheet is a summary. It may not cover all possible information. If you have questions about this medicine, talk to your doctor, pharmacist, or health care provider.  2015, [employer]. (2012-10-01 13:17:35)

## 2014-10-30 NOTE — Progress Notes (Signed)
Orthopedic Tech Progress Note Patient Details:  Angela Hartman 1980/04/30 [id number]  Ortho Devices Type of Ortho Device: Arm sling, Post (long arm) splint Ortho Device/Splint Location: LUE Ortho Device/Splint Interventions: Application   Asia R Thompson 10/30/2014, 4:52 AM

## 2015-01-21 ENCOUNTER — Other Ambulatory Visit: Payer: Self-pay | Admitting: Obstetrics and Gynecology

## 2015-01-22 LAB — CYTOLOGY - PAP

## 2015-12-06 ENCOUNTER — Emergency Department (HOSPITAL_COMMUNITY): Payer: Commercial Managed Care - HMO

## 2015-12-06 ENCOUNTER — Emergency Department (HOSPITAL_COMMUNITY)
Admission: EM | Admit: 2015-12-06 | Discharge: 2015-12-07 | Disposition: A | Discharge status: Home / Self Care | Payer: Commercial Managed Care - HMO | Attending: Emergency Medicine | Admitting: Emergency Medicine

## 2015-12-06 ENCOUNTER — Encounter (HOSPITAL_COMMUNITY): Payer: Self-pay | Admitting: *Deleted

## 2015-12-06 DIAGNOSIS — R102 Pelvic and perineal pain: Secondary | ICD-10-CM | POA: Diagnosis not present

## 2015-12-06 DIAGNOSIS — R109 Unspecified abdominal pain: Secondary | ICD-10-CM

## 2015-12-06 DIAGNOSIS — F17200 Nicotine dependence, unspecified, uncomplicated: Secondary | ICD-10-CM | POA: Diagnosis not present

## 2015-12-06 DIAGNOSIS — R231 Pallor: Secondary | ICD-10-CM | POA: Insufficient documentation

## 2015-12-06 DIAGNOSIS — R1084 Generalized abdominal pain: Secondary | ICD-10-CM | POA: Diagnosis present

## 2015-12-06 DIAGNOSIS — Z7982 Long term (current) use of aspirin: Secondary | ICD-10-CM | POA: Diagnosis not present

## 2015-12-06 HISTORY — DX: Calculus of kidney: N20.0

## 2015-12-06 LAB — COMPREHENSIVE METABOLIC PANEL
ALK PHOS: 50 U/L (ref 38–126)
ALT: 19 U/L (ref 14–54)
ANION GAP: 11 (ref 5–15)
AST: 30 U/L (ref 15–41)
Albumin: 3.8 g/dL (ref 3.5–5.0)
BILIRUBIN TOTAL: 0.9 mg/dL (ref 0.3–1.2)
BUN: 13 mg/dL (ref 6–20)
CALCIUM: 9 mg/dL (ref 8.9–10.3)
CO2: 18 mmol/L — AB (ref 22–32)
Chloride: 107 mmol/L (ref 101–111)
Creatinine, Ser: 0.87 mg/dL (ref 0.44–1.00)
GFR calc non Af Amer: >60 mL/min (ref >60)
Glucose, Bld: 159 mg/dL — ABNORMAL HIGH (ref 65–99)
Potassium: 3.4 mmol/L — ABNORMAL LOW (ref 3.5–5.1)
Sodium: 136 mmol/L (ref 135–145)
TOTAL PROTEIN: 6.2 g/dL — AB (ref 6.5–8.1)

## 2015-12-06 LAB — CBC
HCT: 32.5 % — ABNORMAL LOW (ref 36.0–46.0)
HEMOGLOBIN: 10.4 g/dL — AB (ref 12.0–15.0)
MCH: 25.9 pg — AB (ref 26.0–34.0)
MCHC: 32 g/dL (ref 30.0–36.0)
MCV: 80.8 fL (ref 78.0–100.0)
PLATELETS: 302 10*3/uL (ref 150–400)
RBC: 4.02 MIL/uL (ref 3.87–5.11)
RDW: 16.1 % — ABNORMAL HIGH (ref 11.5–15.5)
WBC: 10.4 10*3/uL (ref 4.0–10.5)

## 2015-12-06 LAB — I-STAT BETA HCG BLOOD, ED (MC, WL, AP ONLY)

## 2015-12-06 LAB — WET PREP, GENITAL
Sperm: NONE SEEN
TRICH WET PREP: NONE SEEN
YEAST WET PREP: NONE SEEN

## 2015-12-06 LAB — URINALYSIS, ROUTINE W REFLEX MICROSCOPIC
BILIRUBIN URINE: NEGATIVE
Glucose, UA: NEGATIVE mg/dL
HGB URINE DIPSTICK: NEGATIVE
Ketones, ur: NEGATIVE mg/dL
Leukocytes, UA: NEGATIVE
Nitrite: NEGATIVE
PROTEIN: NEGATIVE mg/dL
SPECIFIC GRAVITY, URINE: 1.034 — AB (ref 1.005–1.030)
pH: 5.5 (ref 5.0–8.0)

## 2015-12-06 LAB — LIPASE, BLOOD: Lipase: 21 U/L (ref 11–51)

## 2015-12-06 LAB — POC URINE PREG, ED: Preg Test, Ur: NEGATIVE

## 2015-12-06 MED ORDER — HYDROCODONE-ACETAMINOPHEN 7.5-325 MG/15ML PO SOLN: 15.0000 mL | ORAL | 0 refills | Status: DC | PRN

## 2015-12-06 MED ORDER — ONDANSETRON HCL 4 MG/2ML IJ SOLN
4.0000 mg | Freq: Once | INTRAMUSCULAR | Status: AC
  Administered 2015-12-06: 4 mg via INTRAVENOUS
  Filled 2015-12-06: qty 2

## 2015-12-06 MED ORDER — POLYETHYLENE GLYCOL 3350 17 G PO PACK: 17.0000 g | PACK | Freq: Every day | ORAL | 0 refills | Status: DC

## 2015-12-06 MED ORDER — ONDANSETRON 4 MG PO TBDP: 4.0000 mg | ORAL_TABLET | Freq: Three times a day (TID) | ORAL | 0 refills | Status: DC | PRN

## 2015-12-06 MED ORDER — HYDROMORPHONE HCL 1 MG/ML IJ SOLN
1.0000 mg | Freq: Once | INTRAMUSCULAR | Status: AC
  Administered 2015-12-06: 1 mg via INTRAVENOUS
  Filled 2015-12-06: qty 1

## 2015-12-06 MED ORDER — SODIUM CHLORIDE 0.9 % IV BOLUS (SEPSIS): 1000.0000 mL | Freq: Once | INTRAVENOUS | Status: DC

## 2015-12-06 MED ORDER — SODIUM CHLORIDE 0.9 % IV BOLUS (SEPSIS)
1000.0000 mL | Freq: Once | INTRAVENOUS | Status: AC
  Administered 2015-12-06: 1000 mL via INTRAVENOUS

## 2015-12-06 NOTE — ED Triage Notes (Signed)
Pt c/o RUQ pain onset this morning associated with NV and weakness. Also reports  Intermittent chills

## 2015-12-06 NOTE — Discharge Instructions (Signed)
Please schedule an appointment to follow up with [facility] in 1-2 weeks to check on your symptoms. We have provided a prescriptions for pain (Hycet), nausea (Zofran ODT dissolving tablet), and one as needed for constipation (Miralax). Please try and remain well-hydrated at home and continue to eat. Your pain and symptoms should improved gradually.

## 2015-12-06 NOTE — ED Notes (Signed)
Pt back to her room from [place] and X ray.

## 2015-12-06 NOTE — ED Notes (Signed)
Patient transported to Ultrasound 

## 2015-12-06 NOTE — ED Provider Notes (Signed)
[place] DEPT Provider Note   CSN: [id number] Arrival date & time: 12/06/15  1932     History   Chief Complaint Chief Complaint  Patient presents with  . Abdominal Pain    HPI Angela Hartman is a 35 y.o. female.  Angela Hartman is a 35 yo woman with PMH atypical chest pain, cholecystectomy, nephrolithiasis who presents for acute onset abdominal pain that started at 10AM this morning with associated nausea, vomiting x1, and poor po intake. She reports the pain as sharp and aching, generalized, approx 4-5/10, intermittent radiates to her shoulders, constant but spikes intermittently. She reports the pain is worst in her lower abdomen but she is only tender to palpation in her epigastrum. The pain is pleuritic in nature and worse while laying flat. She endorses subjective fevers/chills, dizziness, shortness of breath and denies constipation, diarrhea, urinary symptoms (dysuria, frequency, obstruction), recent sick contacts. Patient reports that she cannot take medications in pill form, requires liquid, dissolvable, or IV.      Past Medical History:  Diagnosis Date  . Chest pain   . Nephrolithiasis     Patient Active Problem List   Diagnosis Date Noted  . Chest pain 12/09/2013  . Smoking 12/09/2013    Past Surgical History:  Procedure Laterality Date  . CHOLECYSTECTOMY      OB History    No data available       Home Medications    Prior to Admission medications   Medication Sig Start Date End Date Taking? Authorizing Provider  Aspirin-Acetaminophen-Caffeine (GOODY HEADACHE PO) Take 1 packet by mouth daily as needed (for pain or headaches).   Yes Historical Provider, MD  Aspirin-Salicylamide-Caffeine (BC HEADACHE POWDER PO) Take 1 packet by mouth daily as needed (for pain or headaches).   Yes Historical Provider, MD  HYDROcodone-acetaminophen (HYCET) 7.5-325 mg/15 ml solution Take 15 mLs by mouth every 4 (four) hours as needed for moderate pain. 12/06/15 12/05/16   Asencion Partridge, MD  ondansetron (ZOFRAN ODT) 4 MG disintegrating tablet Take 1 tablet (4 mg total) by mouth every 8 (eight) hours as needed for nausea or vomiting. 12/06/15   Asencion Partridge, MD  oxyCODONE-acetaminophen (PERCOCET) 5-325 MG per tablet Take 1 tablet by mouth every 4 (four) hours as needed for moderate pain. Patient not taking: Reported on 12/06/2015 0000000   Delora Fuel, MD  polyethylene glycol [facility] / Floria Raveling) packet Take 17 g by mouth daily. 12/06/15   Asencion Partridge, MD    Family History Family History  Problem Relation Age of Onset  . Heart attack Father     Social History Social History  Substance Use Topics  . Smoking status: Current Every Day Smoker  . Smokeless tobacco: Never Used  . Alcohol use No     Allergies   Penicillins   Review of Systems Review of Systems  Constitutional: Positive for appetite change, chills, diaphoresis, fatigue and fever.  Respiratory: Positive for shortness of breath. Negative for cough, chest tightness and wheezing.   Cardiovascular: Negative for chest pain.  Gastrointestinal: Positive for abdominal pain, nausea and vomiting. Negative for constipation and diarrhea.  Genitourinary: Negative for dysuria, frequency and urgency.  Musculoskeletal: Positive for back pain.  Neurological: Positive for dizziness and light-headedness.  All other systems reviewed and are negative.    Physical Exam Updated Vital Signs BP 103/65   Pulse 71   Temp 97.4 F (36.3 C) (Oral)   Resp 20   Ht 5\' 7"  (1.702 m)   Wt 70.3 kg  LMP 11/13/2015   SpO2 97%   BMI 24.28 kg/m   Physical Exam  Constitutional: She is oriented to person, place, and time. She appears well-developed and well-nourished. No distress.  HENT:  Head: Normocephalic and atraumatic.  Eyes: EOM are normal. Pupils are equal, round, and reactive to light.  Neck: Normal range of motion. Neck supple.  Cardiovascular: Normal rate, regular rhythm, normal heart sounds and  intact distal pulses.  Exam reveals no gallop and no friction rub.   No murmur heard. Pulmonary/Chest: Effort normal and breath sounds normal. No respiratory distress. She has no wheezes. She has no rales.  Abdominal: Soft. Bowel sounds are normal. She exhibits no distension and no mass. There is tenderness (epigastrum, left flank, LLQ). There is no guarding.  Musculoskeletal: Normal range of motion. She exhibits no edema, tenderness or deformity.  Neurological: She is alert and oriented to person, place, and time.  Skin: Skin is warm and dry. Capillary refill takes less than 2 seconds. No rash noted. She is not diaphoretic. No erythema. There is pallor.  Psychiatric: Her behavior is normal. Thought content normal.  Odd affect     ED Treatments / Results  Labs (all labs ordered are listed, but only abnormal results are displayed) Labs Reviewed  WET PREP, GENITAL - Abnormal; Notable for the following:       Result Value   Clue Cells Wet Prep HPF POC PRESENT (*)    WBC, Wet Prep HPF POC FEW (*)    All other components within normal limits  COMPREHENSIVE METABOLIC PANEL - Abnormal; Notable for the following:    Potassium 3.4 (*)    CO2 18 (*)    Glucose, Bld 159 (*)    Total Protein 6.2 (*)    All other components within normal limits  CBC - Abnormal; Notable for the following:    Hemoglobin 10.4 (*)    HCT 32.5 (*)    MCH 25.9 (*)    RDW 16.1 (*)    All other components within normal limits  URINALYSIS, ROUTINE W REFLEX MICROSCOPIC (NOT AT [facility]) - Abnormal; Notable for the following:    APPearance CLOUDY (*)    Specific Gravity, Urine 1.034 (*)    All other components within normal limits  LIPASE, BLOOD  POC URINE PREG, ED  I-STAT BETA HCG BLOOD, ED ([facility], [facility], [facility] ONLY)  GC/CHLAMYDIA PROBE AMP ([place]) NOT AT [facility]    EKG  EKG Interpretation None       Radiology US Transvaginal Non-ob  Result Date: 12/06/2015 CLINICAL DATA:  35 y/o F; left-greater-than-right  abdominal pain starting today. EXAM: TRANSABDOMINAL AND TRANSVAGINAL ULTRASOUND OF PELVIS DOPPLER ULTRASOUND OF OVARIES TECHNIQUE: Both transabdominal and transvaginal ultrasound examinations of the pelvis were performed. Transabdominal technique was performed for global imaging of the pelvis including uterus, ovaries, adnexal regions, and pelvic cul-de-sac. It was necessary to proceed with endovaginal exam following the transabdominal exam to visualize the bilateral ovaries. Color and duplex Doppler ultrasound was utilized to evaluate blood flow to the ovaries. COMPARISON:  Same day renal ultrasound. 07/24/2010 CT abdomen pelvis. FINDINGS: Uterus Measurements: 11.7 x 6.2 x 6.1 cm. No fibroids or other mass visualized. Endometrium Thickness: 19 mm.  No focal abnormality visualized. Right ovary Measurements: 3.2 x 2.3 x 2.7 cm. Simple cyst measuring 1.1 x 1.1 x 1.2 cm. Left ovary Measurements: 10.1 x 6.3 x 7.8 cm. Complex cyst measuring 4.7 x 3.4 x 3.7 cm with complex internal echoes with the appearance of clot.  Pulsed Doppler evaluation of both ovaries demonstrates normal low-resistance arterial and venous waveforms. Other findings Small volume of fluid surrounding the left ovary with increased echogenicity. IMPRESSION: Left ovary complex cyst measuring up to 4.7 cm without flow on color Doppler, likely a hemorrhagic cyst. The cyst as probably ruptured with a small volume of complex fluid/hemorrhage surrounding the left ovary. No evidence for ovarian torsion at this time. Electronically Signed   By: Kristine Garbe M.D.   On: 12/06/2015 22:56   US Pelvis Complete  Result Date: 12/06/2015 CLINICAL DATA:  35 y/o F; left-greater-than-right abdominal pain starting today. EXAM: TRANSABDOMINAL AND TRANSVAGINAL ULTRASOUND OF PELVIS DOPPLER ULTRASOUND OF OVARIES TECHNIQUE: Both transabdominal and transvaginal ultrasound examinations of the pelvis were performed. Transabdominal technique was performed for  global imaging of the pelvis including uterus, ovaries, adnexal regions, and pelvic cul-de-sac. It was necessary to proceed with endovaginal exam following the transabdominal exam to visualize the bilateral ovaries. Color and duplex Doppler ultrasound was utilized to evaluate blood flow to the ovaries. COMPARISON:  Same day renal ultrasound. 07/24/2010 CT abdomen pelvis. FINDINGS: Uterus Measurements: 11.7 x 6.2 x 6.1 cm. No fibroids or other mass visualized. Endometrium Thickness: 19 mm.  No focal abnormality visualized. Right ovary Measurements: 3.2 x 2.3 x 2.7 cm. Simple cyst measuring 1.1 x 1.1 x 1.2 cm. Left ovary Measurements: 10.1 x 6.3 x 7.8 cm. Complex cyst measuring 4.7 x 3.4 x 3.7 cm with complex internal echoes with the appearance of clot. Pulsed Doppler evaluation of both ovaries demonstrates normal low-resistance arterial and venous waveforms. Other findings Small volume of fluid surrounding the left ovary with increased echogenicity. IMPRESSION: Left ovary complex cyst measuring up to 4.7 cm without flow on color Doppler, likely a hemorrhagic cyst. The cyst as probably ruptured with a small volume of complex fluid/hemorrhage surrounding the left ovary. No evidence for ovarian torsion at this time. Electronically Signed   By: Kristine Garbe M.D.   On: 12/06/2015 22:56   US Renal  Result Date: 12/06/2015 CLINICAL DATA:  35 y/o F; left-greater-than-right abdominal pain that started today. EXAM: RENAL / URINARY TRACT ULTRASOUND COMPLETE COMPARISON:  None. FINDINGS: Right Kidney: Length: 10.0 cm. Echogenicity within normal limits. No mass or hydronephrosis visualized. Left Kidney: Length: 10.5 cm. Echogenicity within normal limits. No mass or hydronephrosis visualized. Bladder: Appears normal for degree of bladder distention. Other:  Small volume of perihepatic fluid near the right kidney. IMPRESSION: 1. Kidneys are unremarkable without mass or hydronephrosis. 2. Small volume of perihepatic  fluid the right kidney is nonspecific and may be related to local inflammation or tracking ascites. Electronically Signed   By: Kristine Garbe M.D.   On: 12/06/2015 22:25   [place] Art/ven Flow Abd Pelv Doppler  Result Date: 12/06/2015 CLINICAL DATA:  35 y/o F; left-greater-than-right abdominal pain starting today. EXAM: TRANSABDOMINAL AND TRANSVAGINAL ULTRASOUND OF PELVIS DOPPLER ULTRASOUND OF OVARIES TECHNIQUE: Both transabdominal and transvaginal ultrasound examinations of the pelvis were performed. Transabdominal technique was performed for global imaging of the pelvis including uterus, ovaries, adnexal regions, and pelvic cul-de-sac. It was necessary to proceed with endovaginal exam following the transabdominal exam to visualize the bilateral ovaries. Color and duplex Doppler ultrasound was utilized to evaluate blood flow to the ovaries. COMPARISON:  Same day renal ultrasound. 07/24/2010 CT abdomen pelvis. FINDINGS: Uterus Measurements: 11.7 x 6.2 x 6.1 cm. No fibroids or other mass visualized. Endometrium Thickness: 19 mm.  No focal abnormality visualized. Right ovary Measurements: 3.2 x 2.3 x 2.7 cm. Simple  cyst measuring 1.1 x 1.1 x 1.2 cm. Left ovary Measurements: 10.1 x 6.3 x 7.8 cm. Complex cyst measuring 4.7 x 3.4 x 3.7 cm with complex internal echoes with the appearance of clot. Pulsed Doppler evaluation of both ovaries demonstrates normal low-resistance arterial and venous waveforms. Other findings Small volume of fluid surrounding the left ovary with increased echogenicity. IMPRESSION: Left ovary complex cyst measuring up to 4.7 cm without flow on color Doppler, likely a hemorrhagic cyst. The cyst as probably ruptured with a small volume of complex fluid/hemorrhage surrounding the left ovary. No evidence for ovarian torsion at this time. Electronically Signed   By: Kristine Garbe M.D.   On: 12/06/2015 22:56   Dg Abd Acute W/chest  Result Date: 12/06/2015 CLINICAL DATA:  35 y/o   F; abdominal pain with concern for free air. EXAM: DG ABDOMEN ACUTE W/ 1V CHEST COMPARISON:  03/28/2014 chest radiograph FINDINGS: Clear lungs. Normal cardiac silhouette. No pleural effusion or pneumothorax. Normal bowel gas pattern. Right upper quadrant surgical clips, presumably cholecystectomy. No appreciable pneumoperitoneum. No acute osseous abnormality is evident. IMPRESSION: Normal bowel gas pattern. No appreciable pneumoperitoneum. Clear lungs. Electronically Signed   By: Kristine Garbe M.D.   On: 12/06/2015 23:09    Procedures [place] bedside Date/Time: 12/06/2015 11:00 PM Performed by: Wynetta Emery, Rodnesha Elie Authorized by: Drenda Freeze  Consent: Verbal consent obtained. Consent given by: patient Patient understanding: patient states understanding of the procedure being performed Patient identity confirmed: verbally with patient Comments: Supine bedside ultrasound revealed perihepatic free fluid, no apparent hydronephrosis or renal abnormalities, normal appearing bladder but left adnexal mass concerning for complex ovarian cyst visible, suspect hemorrhagic cyst    (including critical care time)  Medications Ordered in ED Medications  sodium chloride 0.9 % bolus 1,000 mL (0 mLs Intravenous Stopped 12/06/15 2230)  ondansetron (ZOFRAN) injection 4 mg (4 mg Intravenous Given 12/06/15 2040)  HYDROmorphone (DILAUDID) injection 1 mg (1 mg Intravenous Given 12/06/15 2108)  sodium chloride 0.9 % bolus 1,000 mL (0 mLs Intravenous Stopped 12/06/15 2326)     Initial Impression / Assessment and Plan / ED Course  I have reviewed the triage vital signs and the nursing notes.  Pertinent labs & imaging results that were available during my care of the patient were reviewed by me and considered in my medical decision making (see chart for details).  Clinical Course   Angela Hartman present for acute abdominal pain since this morning, positional pain radiates to shoulders concerning for  pleuritic component, abdominal free fluid and complex left adnexal cyst seen on bedside US - confirmed on complete pelvic/TV US - 4.7cm hemorrhagic cyst with some hemoperitoneum, doppler showed no evidence of ovarian torsion. Received Zofran and Dilaudid to symptomatic effect. Patient hemodynamically stable, SBP 100-110s at her baseline with no nausea and significantly improved pain. Plan to discharge home with ob/gyn follow up. Will provide prescription for pain, nausea control, prn miralax for constipation ppx. Address asymptomatic BV at follow up  Final Clinical Impressions(s) / ED Diagnoses   Final diagnoses:  Adnexal tenderness, left  Abdominal pain, unspecified abdominal location    New Prescriptions Discharge Medication List as of 12/06/2015 11:47 PM    START taking these medications   Details  HYDROcodone-acetaminophen (HYCET) 7.5-325 mg/15 ml solution Take 15 mLs by mouth every 4 (four) hours as needed for moderate pain., Starting Sun 12/06/2015, Until Mon 12/05/2016, Print    ondansetron (ZOFRAN ODT) 4 MG disintegrating tablet Take 1 tablet (4 mg total) by mouth every  8 (eight) hours as needed for nausea or vomiting., Starting Sun 12/06/2015, Print    polyethylene glycol (MIRALAX / GLYCOLAX) packet Take 17 g by mouth daily., Starting Sun 12/06/2015, Print         Asencion Partridge, MD 12/07/15 Downs Yao, MD 12/07/15 2050

## 2015-12-07 LAB — GC/CHLAMYDIA PROBE AMP (~~LOC~~) NOT AT ARMC
Chlamydia: NEGATIVE
Neisseria Gonorrhea: NEGATIVE

## 2015-12-07 NOTE — ED Notes (Signed)
Pt understood dc material. NAD noted. Scripts given at dc 

## 2016-01-21 ENCOUNTER — Other Ambulatory Visit: Payer: Self-pay | Admitting: Obstetrics and Gynecology

## 2016-02-08 IMAGING — DX DG ELBOW COMPLETE 3+V*L*
4 series · 4 of 4 positions shown · non-contrast
Comparison: None.

CLINICAL DATA: Patient fell tonight, landing on the left elbow.
Posterior elbow pain.

EXAM:
LEFT ELBOW - COMPLETE 3+ VIEW

[x elbow ap left]
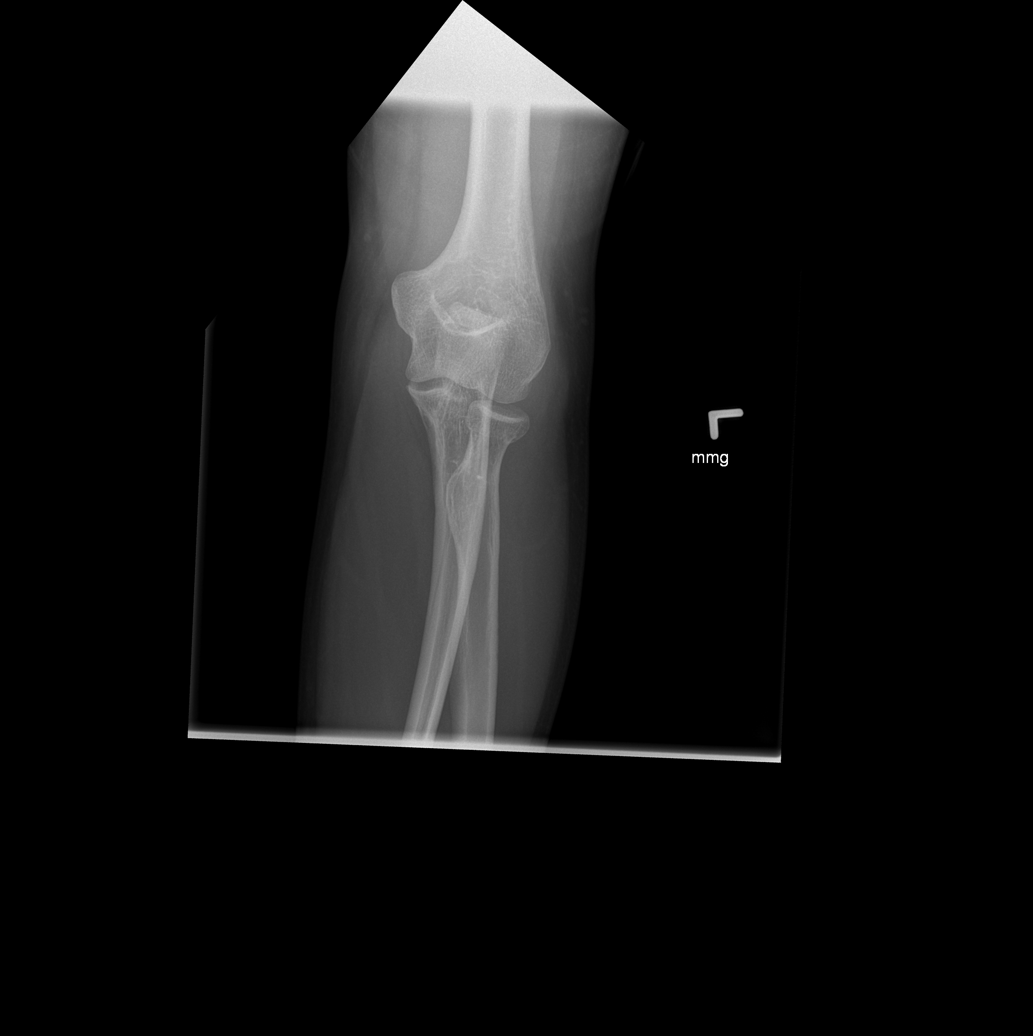

[x elbow obl left (1 of 2)]
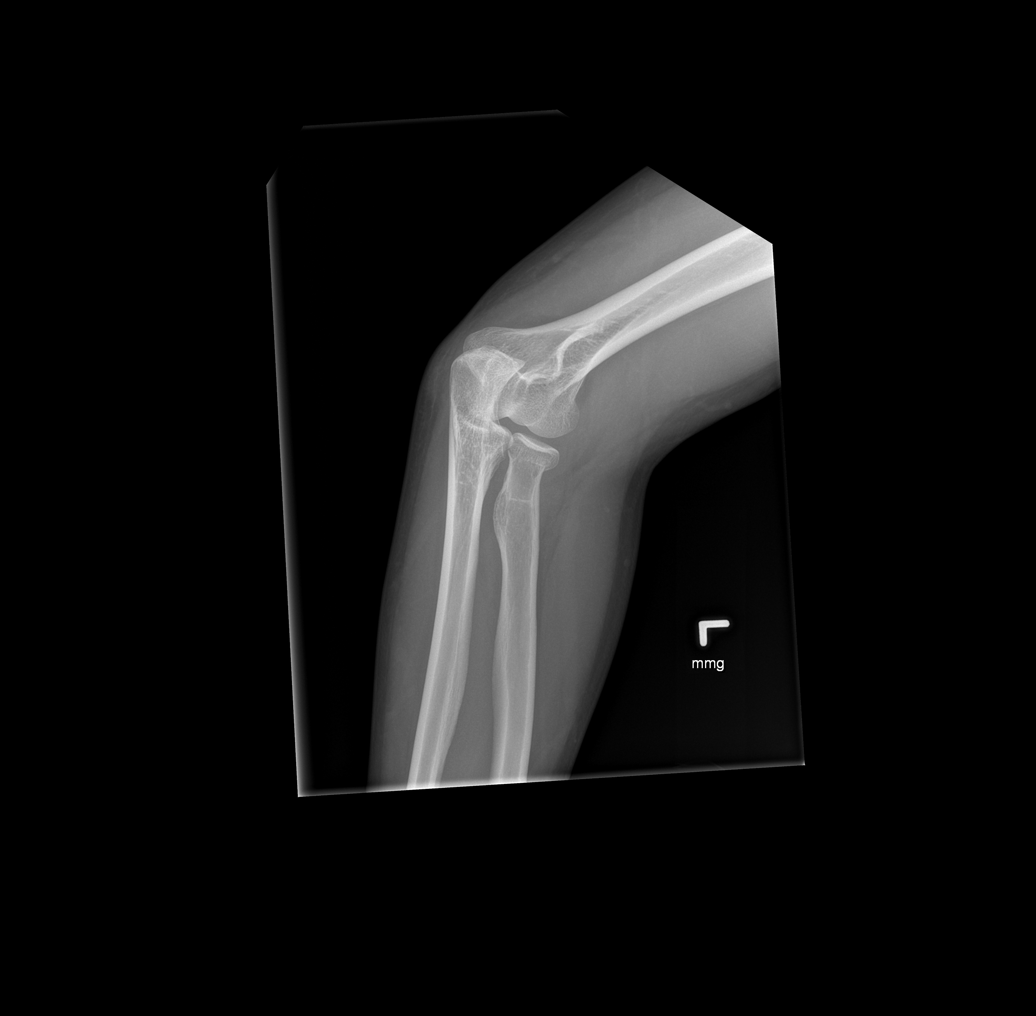

[x elbow obl left (2 of 2)]
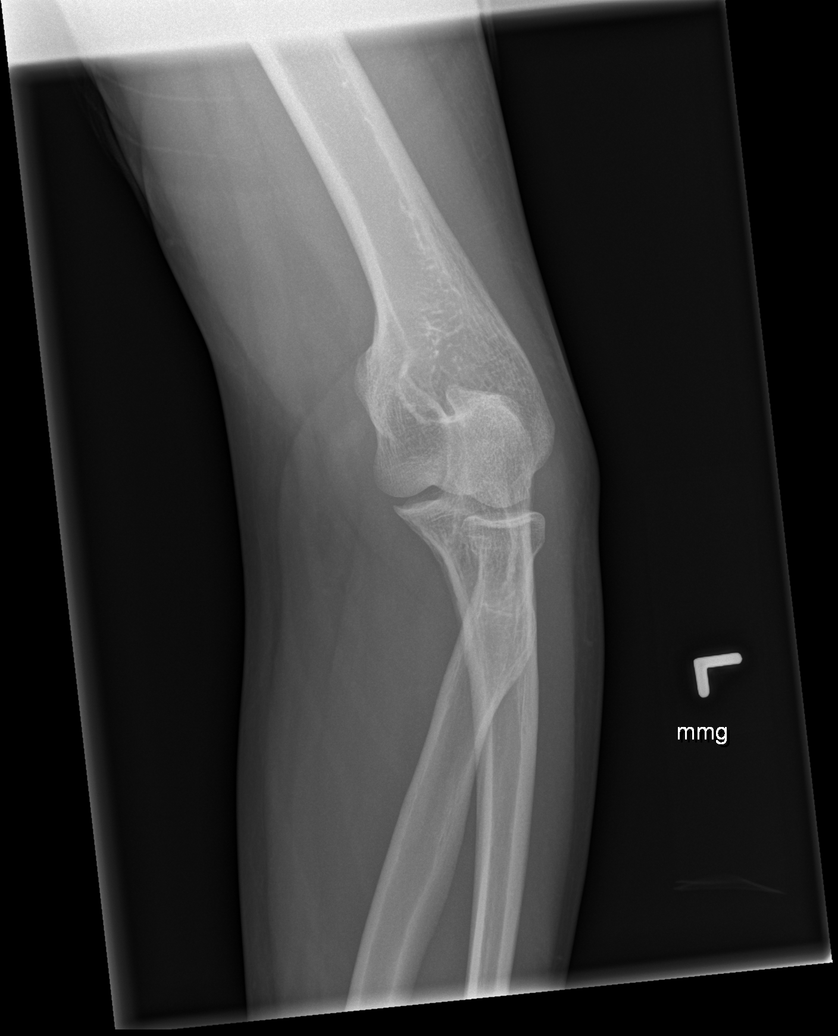

[x elbow lat left]
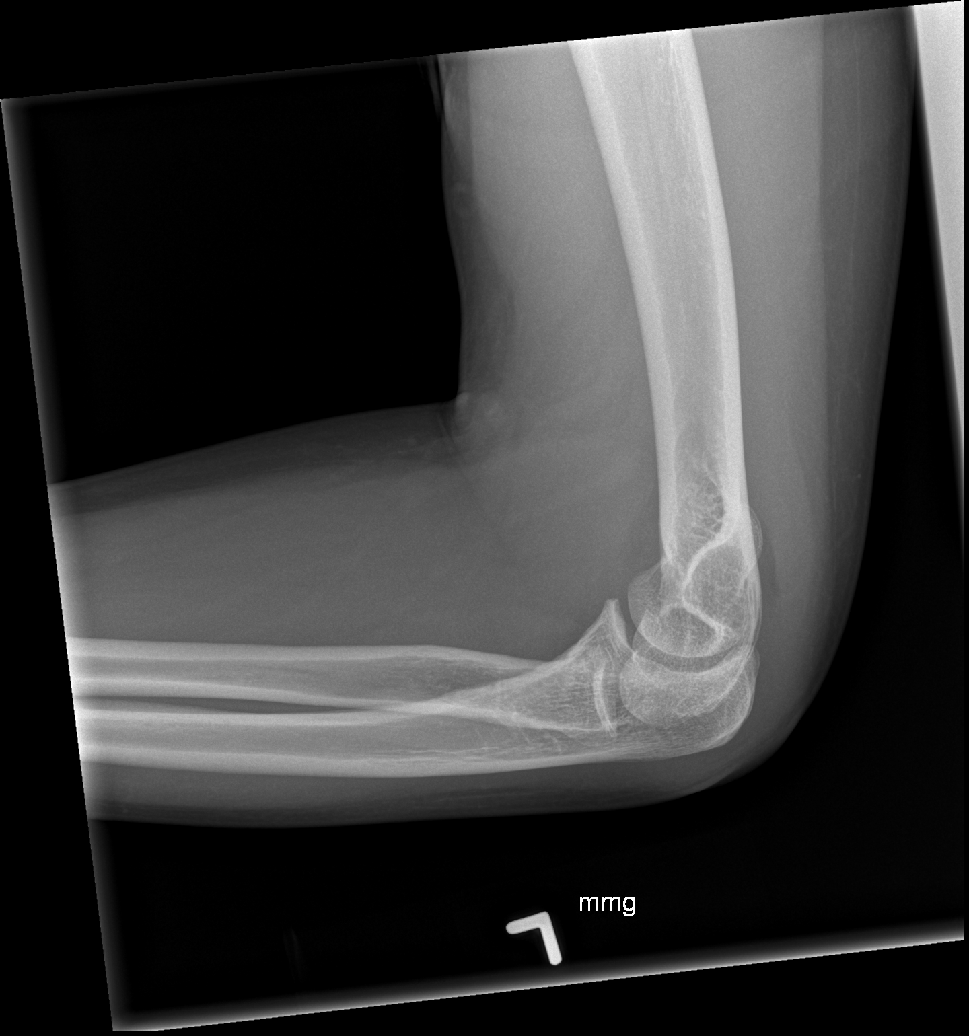

[4 of 4 positions shown; findings below may reference images not displayed]

FINDINGS: There is a left elbow effusion with elevation of the anterior and
posterior fat pads. Vague linear lucency along the radial head
suggests a nondisplaced fracture. No evidence of dislocation of the
elbow. No destructive bone lesions.
IMPRESSION: Left elbow effusion. Probable nondisplaced fracture of the left
radial head.

## 2016-02-22 DIAGNOSIS — R079 Chest pain, unspecified: Secondary | ICD-10-CM

## 2016-02-22 HISTORY — DX: Chest pain, unspecified: R07.9

## 2016-04-07 ENCOUNTER — Other Ambulatory Visit: Payer: Self-pay | Admitting: Obstetrics and Gynecology

## 2016-04-11 NOTE — Patient Instructions (Signed)
Your procedure is scheduled on:  Wednesday, Feb. 28, 2018  Enter through the [employer] [facility] at:  6:00 AM  Pick up the phone at the desk and dial [phone].  Call this number if you have problems the morning of surgery: [phone].  Remember: Do NOT eat food or drink after:  Midnight Tuesday, Feb. 27, 2018  Take these medicines the morning of surgery with a SIP OF WATER:  None  Stop ALL herbal medications at this time  Do NOT smoke the day of surgery.  Do NOT wear jewelry (body piercing), metal hair clips/bobby pins, make-up, or nail polish. Do NOT wear lotions, powders, or perfumes.  You may wear deodorant. Do NOT shave for 48 hours prior to surgery. Do NOT bring valuables to the hospital. Contacts, dentures, or bridgework may not be worn into surgery.  Leave suitcase in car.  After surgery it may be brought to your room.  For patients admitted to the hospital, checkout time is 11:00 AM the day of discharge.  Bring a copy of your healthcare power of attorney and living will documents.  **Effective Friday, Jan. 12, 2018, Angela Hartman will implement no hospital visitations from children age 90 and younger due to a steady increase in flu activity in our community and hospitals. **

## 2016-04-12 ENCOUNTER — Encounter (HOSPITAL_COMMUNITY): Payer: Self-pay

## 2016-04-12 ENCOUNTER — Encounter (HOSPITAL_COMMUNITY)
Admission: RE | Admit: 2016-04-12 | Discharge: 2016-04-12 | Disposition: A | Discharge status: Home / Self Care | Payer: Commercial Managed Care - HMO | Source: Ambulatory Visit | Attending: Obstetrics and Gynecology | Admitting: Obstetrics and Gynecology

## 2016-04-12 DIAGNOSIS — Z01812 Encounter for preprocedural laboratory examination: Secondary | ICD-10-CM | POA: Insufficient documentation

## 2016-04-12 HISTORY — DX: Anxiety disorder, unspecified: F41.9

## 2016-04-12 HISTORY — DX: Headache, unspecified: R51.9

## 2016-04-12 HISTORY — DX: Gastro-esophageal reflux disease without esophagitis: K21.9

## 2016-04-12 HISTORY — DX: Anemia, unspecified: D64.9

## 2016-04-12 HISTORY — DX: Headache: R51

## 2016-04-12 LAB — CBC
HCT: 33.9 % — ABNORMAL LOW (ref 36.0–46.0)
HEMOGLOBIN: 11 g/dL — AB (ref 12.0–15.0)
MCH: 26.3 pg (ref 26.0–34.0)
MCHC: 32.4 g/dL (ref 30.0–36.0)
MCV: 80.9 fL (ref 78.0–100.0)
Platelets: 319 10*3/uL (ref 150–400)
RBC: 4.19 MIL/uL (ref 3.87–5.11)
RDW: 17.1 % — AB (ref 11.5–15.5)
WBC: 7.6 10*3/uL (ref 4.0–10.5)

## 2016-04-19 MED ORDER — CIPROFLOXACIN IN D5W 400 MG/200ML IV SOLN
400.0000 mg | INTRAVENOUS | Status: AC
  Administered 2016-04-20: 400 mg via INTRAVENOUS
  Filled 2016-04-19: qty 200

## 2016-04-19 MED ORDER — CLINDAMYCIN PHOSPHATE 900 MG/50ML IV SOLN
900.0000 mg | INTRAVENOUS | Status: AC
  Administered 2016-04-20: 900 mg via INTRAVENOUS
  Filled 2016-04-19: qty 50

## 2016-04-19 NOTE — H&P (Signed)
36 y.o. 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  Margins were likely positive in the endocervical portion of the canal.  Pt had very difficult time with LEEP both physically (a large amount of lidocaine did not provide pain relief) and emotionally.  Pt has had some complex cysts seen on [place] in past but last [place] was normal.  Pt having dyspareunia which is not every time but affecting her relationship and given all these factors, she strongly desires definitive therapy.  Pt was offered Dx scope with CKC under anesthesia but despite d/w her about R/B/Alt of all she still strongly desires definitive.  .  Past Medical History:  Diagnosis Date  . Anemia   . Anxiety   . Chest pain    Went to ER, EKG normal, due to anxiety per patient  . GERD (gastroesophageal reflux disease)   . Headache    migraines  . Nephrolithiasis    patient denies    Past Surgical History:  Procedure Laterality Date  . CHOLECYSTECTOMY    . WISDOM TOOTH EXTRACTION      Social History   Social History  . Marital status: Legally Separated    Spouse name: N/A  . Number of children: N/A  . Years of education: N/A   Occupational History  . Not on file.   Social History Main Topics  . Smoking status: Current Every Day Smoker    Packs/day: 1.00    Years: 8.00    Types: Cigarettes  . Smokeless tobacco: Never Used  . Alcohol use No  . Drug use: No  . Sexual activity: Not on file   Other Topics Concern  . Not on file   Social History Narrative  . No narrative on file    No current facility-administered medications on file prior to encounter.    Current Outpatient Prescriptions on File Prior to Encounter  Medication Sig Dispense Refill  . Aspirin-Acetaminophen-Caffeine (GOODY HEADACHE PO) Take 1 packet by mouth daily as needed (for pain or headaches).    . Aspirin-Salicylamide-Caffeine (BC HEADACHE POWDER PO) Take 1 packet by mouth daily as needed (for pain or headaches).     Marland Kitchen HYDROcodone-acetaminophen (HYCET) 7.5-325 mg/15 ml solution Take 15 mLs by mouth every 4 (four) hours as needed for moderate pain. (Patient not taking: Reported on 04/07/2016) 240 mL 0  . ondansetron (ZOFRAN ODT) 4 MG disintegrating tablet Take 1 tablet (4 mg total) by mouth every 8 (eight) hours as needed for nausea or vomiting. (Patient not taking: Reported on 04/07/2016) 20 tablet 0  . oxyCODONE-acetaminophen (PERCOCET) 5-325 MG per tablet Take 1 tablet by mouth every 4 (four) hours as needed for moderate pain. (Patient not taking: Reported on 12/06/2015) 20 tablet 0  . polyethylene glycol (MIRALAX / GLYCOLAX) packet Take 17 g by mouth daily. (Patient not taking: Reported on 04/07/2016) 14 each 0    Allergies  Allergen Reactions  . Penicillins Hives    Has patient had a PCN reaction causing immediate rash, facial/tongue/throat swelling, SOB or lightheadedness with hypotension: Yes Has patient had a PCN reaction causing severe rash involving mucus membranes or skin necrosis: No Has patient had a PCN reaction that required hospitalization: No Has patient had a PCN reaction occurring within the last 10 years: No If all of the above answers are "NO", then may proceed with Cephalosporin use.   . Other     CAN ONLY TAKE LIQUID MEDICATION NO PILLS    @VITALS2 @  Lungs: clear to ascultation Cor:  RRR Abdomen:  soft, nontender, nondistended. Ex:  no cords, erythema Pelvic:  Normal efg, uterus and no masses; tender on exam. Cervix healed from LEEP.    A:  For robo TLH/salpngectomies/possible BSO or USO if ovaries are frankly abnormal or deemed likely to contribute to her pain.    P: All risks, benefits and alternatives d/w patient and she desires to proceed.  Patient has undergone a modified bowel prep and will receive preop antibiotics and SCDs during the operation.     Yusuf Yu A

## 2016-04-19 NOTE — Anesthesia Preprocedure Evaluation (Addendum)
Anesthesia Evaluation   Patient awake    Reviewed: Allergy & Precautions, NPO status , Patient's Chart, lab work & pertinent test results  Airway Mallampati: II  TM Distance: >3 FB Neck ROM: Full    Dental  (+) Dental Advisory Given   Pulmonary Current Smoker,    breath sounds clear to auscultation       Cardiovascular negative cardio ROS   Rhythm:Regular Rate:Normal     Neuro/Psych  Headaches, Anxiety    GI/Hepatic Neg liver ROS, GERD  ,  Endo/Other  negative endocrine ROS  Renal/GU negative Renal ROS     Musculoskeletal   Abdominal   Peds  Hematology  (+) anemia ,   Anesthesia Other Findings   Reproductive/Obstetrics                            Lab Results  Component Value Date   WBC 7.6 04/12/2016   HGB 11.0 (L) 04/12/2016   HCT 33.9 (L) 04/12/2016   MCV 80.9 04/12/2016   PLT 319 04/12/2016   Lab Results  Component Value Date   CREATININE 0.87 12/06/2015   BUN 13 12/06/2015   NA 136 12/06/2015   K 3.4 (L) 12/06/2015   CL 107 12/06/2015   CO2 18 (L) 12/06/2015    Anesthesia Physical Anesthesia Plan  ASA: II  Anesthesia Plan: General   Post-op Pain Management:    Induction: Intravenous  Airway Management Planned: Oral ETT  Additional Equipment:   Intra-op Plan:   Post-operative Plan: Extubation in OR  Informed Consent: I have reviewed the patients History and Physical, chart, labs and discussed the procedure including the risks, benefits and alternatives for the proposed anesthesia with the patient or authorized representative who has indicated his/her understanding and acceptance.   Dental advisory given  Plan Discussed with: CRNA  Anesthesia Plan Comments:        Anesthesia Quick Evaluation

## 2016-04-20 ENCOUNTER — Encounter (HOSPITAL_COMMUNITY)
Admission: RE | Disposition: A | Discharge status: Home / Self Care | Payer: Self-pay | Source: Ambulatory Visit | Attending: Obstetrics and Gynecology

## 2016-04-20 ENCOUNTER — Ambulatory Visit (HOSPITAL_COMMUNITY)
Admission: RE | Admit: 2016-04-20 | Discharge: 2016-04-21 | Disposition: A | Discharge status: Home / Self Care | Payer: Commercial Managed Care - HMO | Source: Ambulatory Visit | Attending: Obstetrics and Gynecology | Admitting: Obstetrics and Gynecology

## 2016-04-20 ENCOUNTER — Encounter (HOSPITAL_COMMUNITY): Payer: Self-pay | Admitting: *Deleted

## 2016-04-20 ENCOUNTER — Ambulatory Visit (HOSPITAL_COMMUNITY): Payer: Commercial Managed Care - HMO | Admitting: Anesthesiology

## 2016-04-20 DIAGNOSIS — D069 Carcinoma in situ of cervix, unspecified: Secondary | ICD-10-CM | POA: Insufficient documentation

## 2016-04-20 DIAGNOSIS — Z880 Allergy status to penicillin: Secondary | ICD-10-CM | POA: Diagnosis not present

## 2016-04-20 DIAGNOSIS — N879 Dysplasia of cervix uteri, unspecified: Secondary | ICD-10-CM | POA: Diagnosis present

## 2016-04-20 DIAGNOSIS — F419 Anxiety disorder, unspecified: Secondary | ICD-10-CM | POA: Insufficient documentation

## 2016-04-20 DIAGNOSIS — N802 Endometriosis of fallopian tube: Secondary | ICD-10-CM | POA: Diagnosis not present

## 2016-04-20 DIAGNOSIS — N941 Unspecified dyspareunia: Secondary | ICD-10-CM | POA: Insufficient documentation

## 2016-04-20 DIAGNOSIS — D251 Intramural leiomyoma of uterus: Secondary | ICD-10-CM | POA: Diagnosis not present

## 2016-04-20 DIAGNOSIS — N801 Endometriosis of ovary: Secondary | ICD-10-CM | POA: Diagnosis not present

## 2016-04-20 DIAGNOSIS — K219 Gastro-esophageal reflux disease without esophagitis: Secondary | ICD-10-CM | POA: Diagnosis not present

## 2016-04-20 DIAGNOSIS — Z9889 Other specified postprocedural states: Secondary | ICD-10-CM

## 2016-04-20 DIAGNOSIS — F17210 Nicotine dependence, cigarettes, uncomplicated: Secondary | ICD-10-CM | POA: Insufficient documentation

## 2016-04-20 DIAGNOSIS — N800 Endometriosis of uterus: Secondary | ICD-10-CM | POA: Diagnosis not present

## 2016-04-20 DIAGNOSIS — N83202 Unspecified ovarian cyst, left side: Secondary | ICD-10-CM | POA: Insufficient documentation

## 2016-04-20 DIAGNOSIS — N83201 Unspecified ovarian cyst, right side: Secondary | ICD-10-CM | POA: Insufficient documentation

## 2016-04-20 HISTORY — PX: ROBOTIC ASSISTED TOTAL HYSTERECTOMY WITH BILATERAL SALPINGO OOPHERECTOMY: SHX6086

## 2016-04-20 HISTORY — PX: CYSTOSCOPY: SHX5120

## 2016-04-20 LAB — PREGNANCY, URINE: PREG TEST UR: NEGATIVE

## 2016-04-20 LAB — TYPE AND SCREEN
ABO/RH(D): O POS
Antibody Screen: NEGATIVE

## 2016-04-20 LAB — ABO/RH: ABO/RH(D): O POS

## 2016-04-20 SURGERY — ROBOTIC ASSISTED TOTAL HYSTERECTOMY WITH BILATERAL SALPINGO OOPHORECTOMY
Anesthesia: General | Site: Urethra

## 2016-04-20 MED ORDER — DEXAMETHASONE SODIUM PHOSPHATE 4 MG/ML IJ SOLN
INTRAMUSCULAR | Status: AC
  Filled 2016-04-20: qty 1

## 2016-04-20 MED ORDER — SCOPOLAMINE 1 MG/3DAYS TD PT72
1.0000 | MEDICATED_PATCH | Freq: Once | TRANSDERMAL | Status: DC
  Administered 2016-04-20: 1.5 mg via TRANSDERMAL

## 2016-04-20 MED ORDER — SIMETHICONE 80 MG PO CHEW
80.0000 mg | CHEWABLE_TABLET | Freq: Once | ORAL | Status: AC
  Administered 2016-04-20: 80 mg via ORAL
  Filled 2016-04-20: qty 1

## 2016-04-20 MED ORDER — LACTATED RINGERS IV SOLN
INTRAVENOUS | Status: DC
  Administered 2016-04-20: 08:00:00 via INTRAVENOUS
  Administered 2016-04-20: 125 mL/h via INTRAVENOUS
  Administered 2016-04-20 (×3): via INTRAVENOUS

## 2016-04-20 MED ORDER — FENTANYL CITRATE (PF) 250 MCG/5ML IJ SOLN
INTRAMUSCULAR | Status: AC
  Filled 2016-04-20: qty 5

## 2016-04-20 MED ORDER — MENTHOL 3 MG MT LOZG: 1.0000 | LOZENGE | OROMUCOSAL | Status: DC | PRN

## 2016-04-20 MED ORDER — HYDROMORPHONE HCL 1 MG/ML IJ SOLN
INTRAMUSCULAR | Status: DC | PRN
Start: 2016-04-20 — End: 2016-04-20
  Administered 2016-04-20 (×2): 0.5 mg via INTRAVENOUS

## 2016-04-20 MED ORDER — OXYCODONE HCL 5 MG/5ML PO SOLN
5.0000 mg | ORAL | Status: DC | PRN
  Administered 2016-04-20 – 2016-04-21 (×4): 5 mg via ORAL
  Filled 2016-04-20 (×4): qty 5

## 2016-04-20 MED ORDER — ROCURONIUM BROMIDE 100 MG/10ML IV SOLN
INTRAVENOUS | Status: AC
  Filled 2016-04-20: qty 1

## 2016-04-20 MED ORDER — ONDANSETRON HCL 4 MG PO TABS: 4.0000 mg | ORAL_TABLET | Freq: Four times a day (QID) | ORAL | Status: DC | PRN

## 2016-04-20 MED ORDER — HYDROMORPHONE HCL 1 MG/ML IJ SOLN
INTRAMUSCULAR | Status: AC
  Filled 2016-04-20: qty 1

## 2016-04-20 MED ORDER — SCOPOLAMINE 1 MG/3DAYS TD PT72
MEDICATED_PATCH | TRANSDERMAL | Status: AC
  Administered 2016-04-20: 1.5 mg via TRANSDERMAL
  Filled 2016-04-20: qty 1

## 2016-04-20 MED ORDER — PHENYLEPHRINE HCL 10 MG/ML IJ SOLN
INTRAMUSCULAR | Status: DC | PRN
  Administered 2016-04-20 (×3): 80 ug via INTRAVENOUS

## 2016-04-20 MED ORDER — SUGAMMADEX SODIUM 200 MG/2ML IV SOLN
INTRAVENOUS | Status: DC | PRN
  Administered 2016-04-20: 150 mg via INTRAVENOUS

## 2016-04-20 MED ORDER — EPHEDRINE SULFATE 50 MG/ML IJ SOLN
INTRAMUSCULAR | Status: DC | PRN
  Administered 2016-04-20 (×4): 5 mg via INTRAVENOUS

## 2016-04-20 MED ORDER — ONDANSETRON HCL 4 MG/2ML IJ SOLN: 4.0000 mg | Freq: Four times a day (QID) | INTRAMUSCULAR | Status: DC | PRN

## 2016-04-20 MED ORDER — DEXAMETHASONE SODIUM PHOSPHATE 10 MG/ML IJ SOLN
INTRAMUSCULAR | Status: DC | PRN
  Administered 2016-04-20: 4 mg via INTRAVENOUS

## 2016-04-20 MED ORDER — IBUPROFEN 800 MG PO TABS: 800.0000 mg | ORAL_TABLET | Freq: Three times a day (TID) | ORAL | Status: DC | PRN

## 2016-04-20 MED ORDER — ROCURONIUM BROMIDE 100 MG/10ML IV SOLN
INTRAVENOUS | Status: DC | PRN
  Administered 2016-04-20: 40 mg via INTRAVENOUS
  Administered 2016-04-20: 10 mg via INTRAVENOUS

## 2016-04-20 MED ORDER — SODIUM CHLORIDE 0.9 % IJ SOLN
INTRAMUSCULAR | Status: AC
  Filled 2016-04-20: qty 50

## 2016-04-20 MED ORDER — PROPOFOL 10 MG/ML IV BOLUS
INTRAVENOUS | Status: DC | PRN
  Administered 2016-04-20: 150 mg via INTRAVENOUS

## 2016-04-20 MED ORDER — ACETAMINOPHEN 160 MG/5ML PO SOLN
325.0000 mg | ORAL | Status: DC | PRN
  Administered 2016-04-20: 649.6 mg via ORAL
  Administered 2016-04-21: 650 mg via ORAL
  Filled 2016-04-20 (×2): qty 20.3

## 2016-04-20 MED ORDER — ONDANSETRON HCL 4 MG/2ML IJ SOLN
INTRAMUSCULAR | Status: AC
  Filled 2016-04-20: qty 2

## 2016-04-20 MED ORDER — KETOROLAC TROMETHAMINE 30 MG/ML IJ SOLN
INTRAMUSCULAR | Status: DC | PRN
  Administered 2016-04-20: 30 mg via INTRAVENOUS

## 2016-04-20 MED ORDER — FENTANYL CITRATE (PF) 100 MCG/2ML IJ SOLN
INTRAMUSCULAR | Status: DC | PRN
  Administered 2016-04-20 (×2): 100 ug via INTRAVENOUS

## 2016-04-20 MED ORDER — MIDAZOLAM HCL 2 MG/2ML IJ SOLN
INTRAMUSCULAR | Status: AC
  Filled 2016-04-20: qty 2

## 2016-04-20 MED ORDER — KETOROLAC TROMETHAMINE 30 MG/ML IJ SOLN: 30.0000 mg | Freq: Four times a day (QID) | INTRAMUSCULAR | Status: DC

## 2016-04-20 MED ORDER — ROPIVACAINE HCL 5 MG/ML IJ SOLN
INTRAMUSCULAR | Status: AC
  Filled 2016-04-20: qty 30

## 2016-04-20 MED ORDER — LIDOCAINE HCL (CARDIAC) 20 MG/ML IV SOLN
INTRAVENOUS | Status: AC
  Filled 2016-04-20: qty 5

## 2016-04-20 MED ORDER — ONDANSETRON 4 MG PO TBDP
4.0000 mg | ORAL_TABLET | Freq: Four times a day (QID) | ORAL | Status: DC | PRN
  Filled 2016-04-20: qty 1

## 2016-04-20 MED ORDER — PHENYLEPHRINE 40 MCG/ML (10ML) SYRINGE FOR IV PUSH (FOR BLOOD PRESSURE SUPPORT)
PREFILLED_SYRINGE | INTRAVENOUS | Status: AC
  Filled 2016-04-20: qty 10

## 2016-04-20 MED ORDER — KETOROLAC TROMETHAMINE 30 MG/ML IJ SOLN
INTRAMUSCULAR | Status: AC
  Filled 2016-04-20: qty 1

## 2016-04-20 MED ORDER — ALBUTEROL SULFATE (2.5 MG/3ML) 0.083% IN NEBU
2.5000 mg | INHALATION_SOLUTION | Freq: Once | RESPIRATORY_TRACT | Status: AC
  Administered 2016-04-20: 2.5 mg via RESPIRATORY_TRACT

## 2016-04-20 MED ORDER — OXYCODONE-ACETAMINOPHEN 5-325 MG PO TABS: 1.0000 | ORAL_TABLET | ORAL | Status: DC | PRN

## 2016-04-20 MED ORDER — HYDROMORPHONE HCL 1 MG/ML IJ SOLN
0.2500 mg | INTRAMUSCULAR | Status: DC | PRN
  Administered 2016-04-20: 0.25 mg via INTRAVENOUS

## 2016-04-20 MED ORDER — ALBUTEROL SULFATE (2.5 MG/3ML) 0.083% IN NEBU
INHALATION_SOLUTION | RESPIRATORY_TRACT | Status: AC
  Filled 2016-04-20: qty 3

## 2016-04-20 MED ORDER — IBUPROFEN 100 MG/5ML PO SUSP
800.0000 mg | Freq: Three times a day (TID) | ORAL | Status: DC | PRN
  Administered 2016-04-20 – 2016-04-21 (×2): 800 mg via ORAL
  Filled 2016-04-20 (×3): qty 40

## 2016-04-20 MED ORDER — LIDOCAINE HCL (CARDIAC) 20 MG/ML IV SOLN
INTRAVENOUS | Status: DC | PRN
  Administered 2016-04-20: 100 mg via INTRAVENOUS

## 2016-04-20 MED ORDER — PROMETHAZINE HCL 25 MG/ML IJ SOLN: 6.2500 mg | INTRAMUSCULAR | Status: DC | PRN

## 2016-04-20 MED ORDER — SODIUM CHLORIDE 0.9 % IV SOLN
INTRAVENOUS | Status: DC | PRN
  Administered 2016-04-20: 10:00:00

## 2016-04-20 MED ORDER — STERILE WATER FOR IRRIGATION IR SOLN
Status: DC | PRN
  Administered 2016-04-20: 1000 mL

## 2016-04-20 MED ORDER — EPHEDRINE 5 MG/ML INJ
INTRAVENOUS | Status: AC
  Filled 2016-04-20: qty 10

## 2016-04-20 MED ORDER — PROPOFOL 10 MG/ML IV BOLUS
INTRAVENOUS | Status: AC
  Filled 2016-04-20: qty 20

## 2016-04-20 MED ORDER — MIDAZOLAM HCL 2 MG/2ML IJ SOLN
INTRAMUSCULAR | Status: DC | PRN
  Administered 2016-04-20: 2 mg via INTRAVENOUS

## 2016-04-20 MED ORDER — LACTATED RINGERS IR SOLN
Status: DC | PRN
  Administered 2016-04-20: 3000 mL

## 2016-04-20 SURGICAL SUPPLY — 73 items
ADH SKN CLS APL DERMABOND .7 (GAUZE/BANDAGES/DRESSINGS) ×2
BARRIER ADHS 3X4 INTERCEED (GAUZE/BANDAGES/DRESSINGS) ×2 IMPLANT
BRR ADH 4X3 ABS CNTRL BYND (GAUZE/BANDAGES/DRESSINGS) ×2
CANISTER SUCT 3000ML (MISCELLANEOUS) ×4 IMPLANT
CATH FOLEY 2WAY SLVR  5CC 16FR (CATHETERS) ×2
CATH FOLEY 2WAY SLVR 5CC 16FR (CATHETERS) ×2 IMPLANT
CATH FOLEY 3WAY  5CC 16FR (CATHETERS)
CATH FOLEY 3WAY 5CC 16FR (CATHETERS) IMPLANT
CLOTH BEACON ORANGE TIMEOUT ST (SAFETY) ×4 IMPLANT
CONT PATH 16OZ SNAP LID 3702 (MISCELLANEOUS) ×4 IMPLANT
COVER BACK TABLE 60X90IN (DRAPES) ×8 IMPLANT
COVER TIP SHEARS 8 DVNC (MISCELLANEOUS) ×2 IMPLANT
COVER TIP SHEARS 8MM DA VINCI (MISCELLANEOUS) ×2
DECANTER SPIKE VIAL GLASS SM (MISCELLANEOUS) ×8 IMPLANT
DEFOGGER SCOPE WARMER CLEARIFY (MISCELLANEOUS) ×4 IMPLANT
DERMABOND ADVANCED (GAUZE/BANDAGES/DRESSINGS) ×2
DERMABOND ADVANCED .7 DNX12 (GAUZE/BANDAGES/DRESSINGS) ×2 IMPLANT
DRSG OPSITE POSTOP 3X4 (GAUZE/BANDAGES/DRESSINGS) ×4 IMPLANT
DURAPREP 26ML APPLICATOR (WOUND CARE) ×4 IMPLANT
ELECT REM PT RETURN 9FT ADLT (ELECTROSURGICAL) ×4
ELECTRODE REM PT RTRN 9FT ADLT (ELECTROSURGICAL) ×2 IMPLANT
GAUZE VASELINE 3X9 (GAUZE/BANDAGES/DRESSINGS) IMPLANT
GLOVE BIO SURGEON STRL SZ7 (GLOVE) ×12 IMPLANT
GLOVE BIOGEL PI IND STRL 7.0 (GLOVE) ×4 IMPLANT
GLOVE BIOGEL PI INDICATOR 7.0 (GLOVE) ×4
GOWN STRL REUS W/TWL LRG LVL3 (GOWN DISPOSABLE) ×8 IMPLANT
GYRUS RUMI II 2.5CM BLUE (DISPOSABLE)
GYRUS RUMI II 3.5CM BLUE (DISPOSABLE) ×4
GYRUS RUMI II 4.0CM BLUE (DISPOSABLE)
KIT ACCESSORY DA VINCI DISP (KITS) ×2
KIT ACCESSORY DVNC DISP (KITS) ×2 IMPLANT
LEGGING LITHOTOMY PAIR STRL (DRAPES) ×4 IMPLANT
MANIPULATOR ADVINCU DEL 3.0 PL (MISCELLANEOUS) IMPLANT
MANIPULATOR ADVINCU DEL 3.5 PL (MISCELLANEOUS) IMPLANT
MANIPULATOR ADVINCU DEL 4.0 PL (MISCELLANEOUS) IMPLANT
MANIPULATOR UTERINE 4.5 ZUMI (MISCELLANEOUS) IMPLANT
NEEDLE INSUFFLATION 120MM (ENDOMECHANICALS) ×4 IMPLANT
OCCLUDER COLPOPNEUMO (BALLOONS) ×4 IMPLANT
PACK ROBOT WH (CUSTOM PROCEDURE TRAY) ×4 IMPLANT
PACK ROBOTIC GOWN (GOWN DISPOSABLE) ×4 IMPLANT
PACK TRENDGUARD 450 HYBRID PRO (MISCELLANEOUS) IMPLANT
PACK TRENDGUARD 600 HYBRD PROC (MISCELLANEOUS) IMPLANT
PAD PREP 24X48 CUFFED NSTRL (MISCELLANEOUS) ×4 IMPLANT
POUCH LAPAROSCOPIC INSTRUMENT (MISCELLANEOUS) ×2 IMPLANT
PROTECTOR NERVE ULNAR (MISCELLANEOUS) ×8 IMPLANT
RUMI II 3.0CM BLUE KOH-EFFICIE (DISPOSABLE) IMPLANT
RUMI II GYRUS 2.5CM BLUE (DISPOSABLE) IMPLANT
RUMI II GYRUS 3.5CM BLUE (DISPOSABLE) IMPLANT
RUMI II GYRUS 4.0CM BLUE (DISPOSABLE) IMPLANT
SET CYSTO W/LG BORE CLAMP LF (SET/KITS/TRAYS/PACK) ×4 IMPLANT
SET IRRIG TUBING LAPAROSCOPIC (IRRIGATION / IRRIGATOR) ×4 IMPLANT
SET TRI-LUMEN FLTR TB AIRSEAL (TUBING) ×4 IMPLANT
SUT DVC VLOC 180 0 12IN GS21 (SUTURE)
SUT VIC AB 2-0 CT2 27 (SUTURE) ×8 IMPLANT
SUT VIC AB 2-0 UR6 27 (SUTURE) ×4 IMPLANT
SUT VICRYL RAPIDE 3 0 (SUTURE) ×8 IMPLANT
SUT VLOC 180 0 9IN  GS21 (SUTURE)
SUT VLOC 180 0 9IN GS21 (SUTURE) IMPLANT
SUTURE DVC VLC 180 0 12IN GS21 (SUTURE) IMPLANT
SYR 50ML LL SCALE MARK (SYRINGE) ×4 IMPLANT
TIP RUMI ORANGE 6.7MMX12CM (TIP) IMPLANT
TIP UTERINE 5.1X6CM LAV DISP (MISCELLANEOUS) IMPLANT
TIP UTERINE 6.7X10CM GRN DISP (MISCELLANEOUS) IMPLANT
TIP UTERINE 6.7X6CM WHT DISP (MISCELLANEOUS) IMPLANT
TIP UTERINE 6.7X8CM BLUE DISP (MISCELLANEOUS) IMPLANT
TOWEL OR 17X24 6PK STRL BLUE (TOWEL DISPOSABLE) ×12 IMPLANT
TRENDGUARD 450 HYBRID PRO PACK (MISCELLANEOUS) ×4
TRENDGUARD 600 HYBRID PROC PK (MISCELLANEOUS)
TROCAR DILATING TIP 12MM 150MM (ENDOMECHANICALS) ×4 IMPLANT
TROCAR DISP BLADELESS 8 DVNC (TROCAR) ×2 IMPLANT
TROCAR DISP BLADELESS 8MM (TROCAR) ×2
TROCAR PORT AIRSEAL 5X120 (TROCAR) ×4 IMPLANT
WATER STERILE IRR 1000ML POUR (IV SOLUTION) ×4 IMPLANT

## 2016-04-20 NOTE — Anesthesia Postprocedure Evaluation (Signed)
Anesthesia Post Note  Patient: Angela Hartman  Procedure(s) Performed: Procedure(s) (LRB): ROBOTIC ASSISTED TOTAL HYSTERECTOMY WITH BILATERAL SALPINGO OOPHORECTOMY (N/A) CYSTOSCOPY (N/A)  Patient location during evaluation: [facility] Anesthesia Type: General Level of consciousness: awake and alert and oriented Pain management: pain level controlled Vital Signs Assessment: post-procedure vital signs reviewed and stable Respiratory status: spontaneous breathing, nonlabored ventilation and patient connected to nasal cannula oxygen Cardiovascular status: stable Postop Assessment: no signs of nausea or vomiting and adequate PO intake Anesthetic complications: no        Last Vitals:  Vitals:   04/20/16 1255 04/20/16 1355  BP: (!) 101/54 (!) 102/46  Pulse: 80 71  Resp:  14  Temp: 37 C 36.7 C    Last Pain:  Vitals:   04/20/16 1400  TempSrc:   PainSc: 3    Pain Goal: Patients Stated Pain Goal: 4 (04/20/16 1400)               Meleana Commerford Hristova

## 2016-04-20 NOTE — Anesthesia Postprocedure Evaluation (Signed)
Anesthesia Post Note  Patient: JET DIETIKER  Procedure(s) Performed: Procedure(s) (LRB): ROBOTIC ASSISTED TOTAL HYSTERECTOMY WITH BILATERAL SALPINGO OOPHORECTOMY (N/A) CYSTOSCOPY (N/A)  Patient location during evaluation: PACU Anesthesia Type: General Level of consciousness: awake and alert Pain management: pain level controlled Vital Signs Assessment: post-procedure vital signs reviewed and stable Respiratory status: spontaneous breathing, nonlabored ventilation, respiratory function stable and patient connected to nasal cannula oxygen Cardiovascular status: blood pressure returned to baseline and stable Postop Assessment: no signs of nausea or vomiting Anesthetic complications: no        Last Vitals:  Vitals:   04/20/16 1238 04/20/16 1255  BP:  (!) 101/54  Pulse: 77 80  Resp: 12   Temp:  37 C    Last Pain:  Vitals:   04/20/16 1255  TempSrc:   PainSc: 3    Pain Goal: Patients Stated Pain Goal: 4 (04/20/16 1230)               Tiajuana Amass

## 2016-04-20 NOTE — Progress Notes (Signed)
There has been no change in the patients history, status or exam since the history and physical.  Vitals:   04/20/16 0623  BP: 111/77  Pulse: 90  Resp: 18  Temp: 98.6 F (37 C)  TempSrc: Oral  SpO2: 98%  Weight: 75.3 kg (166 lb)  Height: 5\' 6"  (1.676 m)    Lab Results  Component Value Date   WBC 7.6 04/12/2016   HGB 11.0 (L) 04/12/2016   HCT 33.9 (L) 04/12/2016   MCV 80.9 04/12/2016   PLT 319 04/12/2016    Angela Hartman A

## 2016-04-20 NOTE — Brief Op Note (Signed)
04/20/2016  10:01 AM  PATIENT:  Angela Hartman  36 y.o. female  PRE-OPERATIVE DIAGNOSIS:  CERVICAL DYSPLASIA  POST-OPERATIVE DIAGNOSIS:  CERVICAL DYSPLASIA  PROCEDURE:  Procedure(s): ROBOTIC ASSISTED TOTAL HYSTERECTOMY WITH BILATERAL SALPINGO OOPHORECTOMY (N/A) CYSTOSCOPY (N/A)  SURGEON:  Surgeon(s) and Role:    * Angela Charleston, MD - Primary    * Angela Charles, MD - Assisting   ANESTHESIA:   general  EBL:  Total I/O In: 1400 [I.V.:1400] Out: 150 [Urine:50; Blood:100]  LOCAL MEDICATIONS USED:  OTHER Ropivicaine, interceed  SPECIMEN:  Source of Specimen:  uterus, cervix, bilateral tubes, adhesions   DISPOSITION OF SPECIMEN:  PATHOLOGY  COUNTS:  YES  DICTATION: .Note written in EPIC  PLAN OF CARE: Admit to inpatient   PATIENT DISPOSITION:  PACU - hemodynamically stable.   Delay start of Pharmacological VTE agent (>24hrs) due to surgical blood loss or risk of bleeding: not applicable  Complications:  None.  Findings:  9 weeks size uterus.  R and L ovaries has some small cysts and surface endometriosis on them.  The ureters were identified during multiple points of the case and were always out of the field of dissection.  Multiple areas of peritoneum had endometriosis spots about 5 mm wide each. On cystoscopy, the bladder was intact and bilateral spill was seen from each ureteral orriface.    Medications:  Ancef.  Bupivicaine.    Technique:  After adequate anesthesia was achieved the patient was positioned, prepped and draped in usual sterile fashion.  A speculum was placed in the vagina and the cervix dilated with pratt dilators.  The Advincula with 3.5 cm Koh ring was placed in proper fashion.  The  Speculum was removed and the bladder catheterized with a foley.    Attention was turned to the abdomen where a 1 cm incision was made 1 cm above the umbilicus.  The veress needle was introduced without aspiration of bowel contents or blood and the abdomen insufflated.  The long 12 mm trocar was placed and the other three trocar sites were marked out, all approximately 10 cm from each other and the camera.  Two 8.5 mm trocars were placed 3 cm above the L iliac crest and the 5 mm airseal placed above the plane of the camera trocar.  All trocars were inserted under direct visualization of the camera.  The patient was placed in trendelenburg and then the Robot docked.  The PK forceps were placed on arm 2 and the Hot shears on arm 1 and introduced under direct visualization of the camera.  I then broke scrub and sat down at the console.  The above findings were noted and the ureters identified well out of the field of dissection.  The surface endometriosis of the ovaries was cauterized.  Two areas in the cul de sac were biopsied and sent to path as adhesions. The rest of the endometriotic spots were all away from important structures and were cauterized.  The right fallopian tube was isolated and cauterized with the PK.  The Utero-ovarian ligament was then divided with the PK cautery and shears.  The posterior broad ligament was then divided with the hot shears until the uterosacral ligament.  The Broad and cardinal ligaments were then cauterized against the cervix to the level of the Koh ring, securing the uterine artery.  Each pedicle was then incised with the shears.  The anterior leaf was then incised at the reflection of the vessico-uterine junction and the lateral bladder retracted inferiorly after  the round ligament had been divided with the PK forceps.  The left tube was cauterized with the PK and divided with the shears;  then the left utero-ovarian ligament divided with the PK forceps and the scissors.  The round ligament was divided as well and the posterior leaf of the broad ligament then divided with the hot shears. The broad and cardinal ligaments were then cauterized on the left in the same way.   At the level of the internal os, the uterine arteries were bilaterally  cauterized with the PK.  The ureters were identified well out of the field of dissection.  .   The bladder was then able to be retracted inferiorly and the vesico-uterine fascia was incised in the midline until the bladder was removed one cm below the Koh ring.  The hot shears then circumferentially incised the vagina at the level of the reflection on the [facility] ring.  Once the uterus and cervix were amputated, cautery was used to insure hemostasis of the cuff.  Once hemostasis was achieved, the instruments were changed to the mega needle driver and mega suture cut needle driver and the cuff was closed with a running stitches of 0-vicryl V loc.  Cautery was used to ensure hemostasis of the right pedicles very superficially.  The ureters were peristalsing bilaterally well and very lateral to the areas of operation.    The Robot was then undocked and I scrubbed back in.  The needle was removed and Bupivicaine was introduced into the pelvis.  The fascia of the 12 mm trocar was closed with a figure of 8 stitch of 0 vicryl.  The skin incisions were closed with subcuticular stitches of 3-0 vicryl Rapide and Dermabond.  All instruments were removed from the vagina and cystoscopy performed, revealing an intact bladder and vigourous spill of urine from each ureteral orifice.  The cystoscope was removed and the patient taken to the recovery room in stable condition.  Angela Hartman A

## 2016-04-20 NOTE — Transfer of Care (Signed)
Immediate Anesthesia Transfer of Care Note  Patient: Angela Hartman  Procedure(s) Performed: Procedure(s): ROBOTIC ASSISTED TOTAL HYSTERECTOMY WITH BILATERAL SALPINGO OOPHORECTOMY (N/A) CYSTOSCOPY (N/A)  Patient Location: PACU  Anesthesia Type:General  Level of Consciousness: awake, alert  and oriented  Airway & Oxygen Therapy: Patient Spontanous Breathing and Patient connected to nasal cannula oxygen  Post-op Assessment: Report given to RN and Post -op Vital signs reviewed and stable  Post vital signs: Reviewed and stable  Last Vitals:  Vitals:   04/20/16 0623  BP: 111/77  Pulse: 90  Resp: 18  Temp: 37 C    Last Pain:  Vitals:   04/20/16 0623  TempSrc: Oral  PainSc: 0-No pain      Patients Stated Pain Goal: 4 ([id number])  Complications: No apparent anesthesia complications

## 2016-04-20 NOTE — Op Note (Signed)
04/20/2016  10:01 AM  PATIENT:  Angela Hartman  36 y.o. female  PRE-OPERATIVE DIAGNOSIS:  CERVICAL DYSPLASIA  POST-OPERATIVE DIAGNOSIS:  CERVICAL DYSPLASIA  PROCEDURE:  Procedure(s): ROBOTIC ASSISTED TOTAL HYSTERECTOMY WITH BILATERAL SALPINGO OOPHORECTOMY (N/A) CYSTOSCOPY (N/A)  SURGEON:  Surgeon(s) and Role:    * Bobbye Charleston, MD - Primary    * Jerelyn Charles, MD - Assisting   ANESTHESIA:   general  EBL:  Total I/O In: 1400 [I.V.:1400] Out: 150 [Urine:50; Blood:100]  LOCAL MEDICATIONS USED:  OTHER Ropivicaine, interceed  SPECIMEN:  Source of Specimen:  uterus, cervix, bilateral tubes, adhesions   DISPOSITION OF SPECIMEN:  PATHOLOGY  COUNTS:  YES  DICTATION: .Note written in EPIC  PLAN OF CARE: Admit to inpatient   PATIENT DISPOSITION:  PACU - hemodynamically stable.   Delay start of Pharmacological VTE agent (>24hrs) due to surgical blood loss or risk of bleeding: not applicable  Complications:  None.  Findings:  9 weeks size uterus.  R and L ovaries has some small cysts and surface endometriosis on them.  The ureters were identified during multiple points of the case and were always out of the field of dissection.  Multiple areas of peritoneum had endometriosis spots about 5 mm wide each. On cystoscopy, the bladder was intact and bilateral spill was seen from each ureteral orriface.    Medications:  Ancef.  Bupivicaine.    Technique:  After adequate anesthesia was achieved the patient was positioned, prepped and draped in usual sterile fashion.  A speculum was placed in the vagina and the cervix dilated with pratt dilators.  The Advincula with 3.5 cm Koh ring was placed in proper fashion.  The  Speculum was removed and the bladder catheterized with a foley.    Attention was turned to the abdomen where a 1 cm incision was made 1 cm above the umbilicus.  The veress needle was introduced without aspiration of bowel contents or blood and the abdomen insufflated.  The long 12 mm trocar was placed and the other three trocar sites were marked out, all approximately 10 cm from each other and the camera.  Two 8.5 mm trocars were placed 3 cm above the L iliac crest and the 5 mm airseal placed above the plane of the camera trocar.  All trocars were inserted under direct visualization of the camera.  The patient was placed in trendelenburg and then the Robot docked.  The PK forceps were placed on arm 2 and the Hot shears on arm 1 and introduced under direct visualization of the camera.  I then broke scrub and sat down at the console.  The above findings were noted and the ureters identified well out of the field of dissection.  The surface endometriosis of the ovaries was cauterized.  Two areas in the cul de sac were biopsied and sent to path as adhesions. The rest of the endometriotic spots were all away from important structures and were cauterized.  The right fallopian tube was isolated and cauterized with the PK.  The Utero-ovarian ligament was then divided with the PK cautery and shears.  The posterior broad ligament was then divided with the hot shears until the uterosacral ligament.  The Broad and cardinal ligaments were then cauterized against the cervix to the level of the Koh ring, securing the uterine artery.  Each pedicle was then incised with the shears.  The anterior leaf was then incised at the reflection of the vessico-uterine junction and the lateral bladder retracted inferiorly after  the round ligament had been divided with the PK forceps.  The left tube was cauterized with the PK and divided with the shears;  then the left utero-ovarian ligament divided with the PK forceps and the scissors.  The round ligament was divided as well and the posterior leaf of the broad ligament then divided with the hot shears. The broad and cardinal ligaments were then cauterized on the left in the same way.   At the level of the internal os, the uterine arteries were bilaterally  cauterized with the PK.  The ureters were identified well out of the field of dissection.  .   The bladder was then able to be retracted inferiorly and the vesico-uterine fascia was incised in the midline until the bladder was removed one cm below the Koh ring.  The hot shears then circumferentially incised the vagina at the level of the reflection on the [facility] ring.  Once the uterus and cervix were amputated, cautery was used to insure hemostasis of the cuff.  Once hemostasis was achieved, the instruments were changed to the mega needle driver and mega suture cut needle driver and the cuff was closed with a running stitches of 0-vicryl V loc.  Cautery was used to ensure hemostasis of the right pedicles very superficially.  The ureters were peristalsing bilaterally well and very lateral to the areas of operation.    The Robot was then undocked and I scrubbed back in.  The needle was removed and Bupivicaine was introduced into the pelvis.  The fascia of the 12 mm trocar was closed with a figure of 8 stitch of 0 vicryl.  The skin incisions were closed with subcuticular stitches of 3-0 vicryl Rapide and Dermabond.  All instruments were removed from the vagina and cystoscopy performed, revealing an intact bladder and vigourous spill of urine from each ureteral orifice.  The cystoscope was removed and the patient taken to the recovery room in stable condition.  Leesa Leifheit A

## 2016-04-20 NOTE — Addendum Note (Signed)
Addendum  created 04/20/16 1559 by Hewitt Blade, CRNA   Sign clinical note

## 2016-04-21 DIAGNOSIS — D069 Carcinoma in situ of cervix, unspecified: Secondary | ICD-10-CM | POA: Diagnosis not present

## 2016-04-21 LAB — URINE CULTURE: Culture: NO GROWTH

## 2016-04-21 MED ORDER — OXYCODONE HCL 5 MG/5ML PO SOLN: 5.0000 mg | ORAL | 0 refills | Status: DC | PRN

## 2016-04-21 MED ORDER — IBUPROFEN 100 MG/5ML PO SUSP: 800.0000 mg | Freq: Three times a day (TID) | ORAL | 2 refills | Status: DC | PRN

## 2016-04-21 NOTE — Discharge Summary (Signed)
Physician Discharge Summary  Patient ID: Angela Hartman MRN: [id number] DOB/AGE: 38-Feb-1982 38 y.o.  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 rate 16, height 5\' 6"  (1.676 m), weight 75.3 kg (166 lb), last menstrual period 04/05/2016, SpO2 95 %.   Disposition: 01-Home or Self Care  Discharge Instructions    Call MD for:  temperature >100.4    Complete by:  As directed    Diet - low sodium heart healthy    Complete by:  As directed    Discharge instructions    Complete by:  As directed    No driving on narcotics, no sexual activity for 2 weeks.   Increase activity slowly    Complete by:  As directed    May shower / Bathe    Complete by:  As directed    Shower, no bath for 2 weeks.   Remove dressing in 24 hours    Complete by:  As directed    Sexual Activity Restrictions    Complete by:  As directed    No sexual activity for 2 weeks.     Allergies as of 04/21/2016      Reactions   Penicillins Hives   Has patient had a PCN reaction causing immediate rash, facial/tongue/throat swelling, SOB or lightheadedness with hypotension: Yes Has patient had a PCN reaction causing severe rash involving mucus membranes or skin necrosis: No Has patient had a PCN reaction that required hospitalization: No Has patient had a PCN reaction occurring within the last 10 years: No If all of the above answers are "NO", then may proceed with Cephalosporin use.   Other    CAN ONLY TAKE LIQUID MEDICATION NO PILLS      Medication List    STOP taking these medications   BC HEADACHE POWDER PO   GOODY HEADACHE PO   HYDROcodone-acetaminophen 7.5-325 mg/15 ml solution Commonly known as:  HYCET   oxyCODONE-acetaminophen 5-325 MG tablet Commonly known as:  PERCOCET   polyethylene glycol packet Commonly known as:  MIRALAX / GLYCOLAX     TAKE these medications   ibuprofen 100 MG/5ML suspension Commonly known as:  ADVIL,MOTRIN Take 40 mLs (800 mg total) by mouth every 8 (eight) hours as needed for mild pain.   ondansetron 4 MG disintegrating tablet Commonly known as:  ZOFRAN ODT Take 1 tablet (4 mg total) by mouth every 8 (eight) hours as needed for nausea or vomiting.   oxyCODONE 5 MG/5ML solution Commonly known as:  ROXICODONE Take 5-10 mLs (5-10 mg total) by mouth every 4 (four) hours as needed (modererate to severe pain (when tolerating fluids)).      Follow-up Information    Hajar Penninger A, MD Follow up in 4 week(s).   Specialty:  Obstetrics and Gynecology Contact information: [address] [place] [address] [phone]           Signed: Korrey Schleicher A 04/21/2016, 7:56 AM

## 2016-04-21 NOTE — Progress Notes (Addendum)
Patient is eating, ambulating, and voiding.  Pain control is good.  Pt c/o some chest pain aching from L to R and to R shoulder.  Pt is smoker and is not really ready to quit but can do nicotine patches if desires.    BP (!) 102/42 (BP Location: Right Arm)   Pulse 69   Temp 99.1 F (37.3 C) (Oral)   Resp 16   Ht 5\' 6"  (1.676 m)   Wt 75.3 kg (166 lb)   LMP 04/05/2016 (Exact Date)   SpO2 95%   BMI 26.79 kg/m   lungs:   clear to auscultation cor:    RRR Abdomen:  soft, appropriate tenderness, incisions intact and without erythema or exudate. ex:    no cords   Lab Results  Component Value Date   WBC 7.6 04/12/2016   HGB 11.0 (L) 04/12/2016   HCT 33.9 (L) 04/12/2016   MCV 80.9 04/12/2016   PLT 319 04/12/2016    A/P  Routine care plus will check EKG.  Expect d/c per plan if EKG is ok.  Pt had a dizzy spell but BP has been running low- if another episode, will check CBC and CMET.

## 2016-04-21 NOTE — Progress Notes (Signed)
Discharge instructions given.  Patient in wheelchair and escorted off the unit.

## 2016-04-22 ENCOUNTER — Encounter (HOSPITAL_COMMUNITY): Payer: Self-pay | Admitting: Obstetrics and Gynecology

## 2016-05-04 ENCOUNTER — Other Ambulatory Visit: Payer: Self-pay | Admitting: Obstetrics and Gynecology

## 2016-05-06 ENCOUNTER — Inpatient Hospital Stay (HOSPITAL_COMMUNITY): Payer: Commercial Managed Care - HMO | Admitting: Certified Registered Nurse Anesthetist

## 2016-05-06 ENCOUNTER — Encounter (HOSPITAL_COMMUNITY)
Admission: AD | Disposition: A | Discharge status: Home / Self Care | Payer: Self-pay | Source: Ambulatory Visit | Attending: Obstetrics and Gynecology

## 2016-05-06 ENCOUNTER — Ambulatory Visit (HOSPITAL_COMMUNITY)
Admission: AD | Admit: 2016-05-06 | Discharge: 2016-05-06 | Disposition: A | Discharge status: Home / Self Care | Payer: Commercial Managed Care - HMO | Source: Ambulatory Visit | Attending: Obstetrics and Gynecology | Admitting: Obstetrics and Gynecology

## 2016-05-06 ENCOUNTER — Encounter (HOSPITAL_COMMUNITY): Payer: Self-pay

## 2016-05-06 DIAGNOSIS — T8132XA Disruption of internal operation (surgical) wound, not elsewhere classified, initial encounter: Secondary | ICD-10-CM | POA: Diagnosis not present

## 2016-05-06 DIAGNOSIS — Y836 Removal of other organ (partial) (total) as the cause of abnormal reaction of the patient, or of later complication, without mention of misadventure at the time of the procedure: Secondary | ICD-10-CM | POA: Insufficient documentation

## 2016-05-06 DIAGNOSIS — F17210 Nicotine dependence, cigarettes, uncomplicated: Secondary | ICD-10-CM | POA: Insufficient documentation

## 2016-05-06 HISTORY — PX: CYSTOSCOPY: SHX5120

## 2016-05-06 HISTORY — PX: CYSTOCELE REPAIR: SHX163

## 2016-05-06 LAB — CBC
HCT: 36.9 % (ref 36.0–46.0)
HEMOGLOBIN: 11.7 g/dL — AB (ref 12.0–15.0)
MCH: 25.9 pg — AB (ref 26.0–34.0)
MCHC: 31.7 g/dL (ref 30.0–36.0)
MCV: 81.8 fL (ref 78.0–100.0)
Platelets: 734 10*3/uL — ABNORMAL HIGH (ref 150–400)
RBC: 4.51 MIL/uL (ref 3.87–5.11)
RDW: 16.4 % — AB (ref 11.5–15.5)
WBC: 11.6 10*3/uL — ABNORMAL HIGH (ref 4.0–10.5)

## 2016-05-06 SURGERY — Surgical Case
Anesthesia: *Unknown

## 2016-05-06 SURGERY — COLPORRHAPHY, ANTERIOR, FOR CYSTOCELE REPAIR
Anesthesia: Spinal | Site: Vagina

## 2016-05-06 MED ORDER — FAMOTIDINE IN NACL 20-0.9 MG/50ML-% IV SOLN
INTRAVENOUS | Status: AC
  Filled 2016-05-06: qty 50

## 2016-05-06 MED ORDER — METRONIDAZOLE 375 MG PO CAPS: 375.0000 mg | ORAL_CAPSULE | Freq: Four times a day (QID) | ORAL | 0 refills | Status: DC

## 2016-05-06 MED ORDER — PROPOFOL 500 MG/50ML IV EMUL
INTRAVENOUS | Status: DC | PRN
  Administered 2016-05-06: 50 ug/kg/min via INTRAVENOUS

## 2016-05-06 MED ORDER — OXYCODONE HCL 5 MG PO TABS: 5.0000 mg | ORAL_TABLET | Freq: Once | ORAL | Status: DC | PRN

## 2016-05-06 MED ORDER — FENTANYL CITRATE (PF) 100 MCG/2ML IJ SOLN
INTRAMUSCULAR | Status: AC
  Filled 2016-05-06: qty 2

## 2016-05-06 MED ORDER — FENTANYL CITRATE (PF) 100 MCG/2ML IJ SOLN
INTRAMUSCULAR | Status: DC | PRN
  Administered 2016-05-06 (×2): 50 ug via INTRAVENOUS

## 2016-05-06 MED ORDER — LIDOCAINE HCL (CARDIAC) 20 MG/ML IV SOLN
INTRAVENOUS | Status: AC
  Filled 2016-05-06: qty 5

## 2016-05-06 MED ORDER — PROPOFOL 10 MG/ML IV BOLUS
INTRAVENOUS | Status: AC
  Filled 2016-05-06: qty 20

## 2016-05-06 MED ORDER — LIDOCAINE HCL (CARDIAC) 20 MG/ML IV SOLN
INTRAVENOUS | Status: DC | PRN
  Administered 2016-05-06: 40 mg via INTRAVENOUS

## 2016-05-06 MED ORDER — PROPOFOL 10 MG/ML IV BOLUS
INTRAVENOUS | Status: DC | PRN
  Administered 2016-05-06: 10 mg via INTRAVENOUS
  Administered 2016-05-06: 20 mg via INTRAVENOUS

## 2016-05-06 MED ORDER — HYDROMORPHONE HCL 1 MG/ML IJ SOLN: 0.2500 mg | INTRAMUSCULAR | Status: DC | PRN

## 2016-05-06 MED ORDER — LACTATED RINGERS IV SOLN
INTRAVENOUS | Status: DC
  Administered 2016-05-06 (×2): via INTRAVENOUS

## 2016-05-06 MED ORDER — METOCLOPRAMIDE HCL 5 MG/ML IJ SOLN: 10.0000 mg | Freq: Once | INTRAMUSCULAR | Status: DC | PRN

## 2016-05-06 MED ORDER — OXYCODONE HCL 5 MG/5ML PO SOLN: 5.0000 mg | ORAL | 0 refills | Status: DC | PRN

## 2016-05-06 MED ORDER — GLYCOPYRROLATE 0.2 MG/ML IJ SOLN
INTRAMUSCULAR | Status: AC
  Filled 2016-05-06: qty 1

## 2016-05-06 MED ORDER — MIDAZOLAM HCL 2 MG/2ML IJ SOLN
INTRAMUSCULAR | Status: DC | PRN
  Administered 2016-05-06 (×2): 1 mg via INTRAVENOUS

## 2016-05-06 MED ORDER — PHENYLEPHRINE 40 MCG/ML (10ML) SYRINGE FOR IV PUSH (FOR BLOOD PRESSURE SUPPORT)
PREFILLED_SYRINGE | INTRAVENOUS | Status: AC
  Filled 2016-05-06: qty 10

## 2016-05-06 MED ORDER — SOD CITRATE-CITRIC ACID 500-334 MG/5ML PO SOLN: 30.0000 mL | Freq: Once | ORAL | Status: DC

## 2016-05-06 MED ORDER — BUPIVACAINE IN DEXTROSE 0.75-8.25 % IT SOLN
INTRATHECAL | Status: DC | PRN
  Administered 2016-05-06: 1.4 mL via INTRATHECAL

## 2016-05-06 MED ORDER — PROPOFOL 500 MG/50ML IV EMUL
INTRAVENOUS | Status: AC
  Filled 2016-05-06: qty 50

## 2016-05-06 MED ORDER — PHENYLEPHRINE HCL 10 MG/ML IJ SOLN
INTRAMUSCULAR | Status: DC | PRN
  Administered 2016-05-06 (×2): 40 ug via INTRAVENOUS
  Administered 2016-05-06: 80 ug via INTRAVENOUS
  Administered 2016-05-06: 40 ug via INTRAVENOUS

## 2016-05-06 MED ORDER — GLYCOPYRROLATE 0.2 MG/ML IJ SOLN
INTRAMUSCULAR | Status: DC | PRN
  Administered 2016-05-06 (×2): 0.1 mg via INTRAVENOUS

## 2016-05-06 MED ORDER — SOD CITRATE-CITRIC ACID 500-334 MG/5ML PO SOLN
ORAL | Status: AC
  Administered 2016-05-06: 30 mL
  Filled 2016-05-06: qty 15

## 2016-05-06 MED ORDER — ONDANSETRON HCL 4 MG/2ML IJ SOLN
INTRAMUSCULAR | Status: DC | PRN
  Administered 2016-05-06: 4 mg via INTRAVENOUS

## 2016-05-06 MED ORDER — OXYCODONE HCL 5 MG/5ML PO SOLN: 5.0000 mg | Freq: Once | ORAL | Status: DC | PRN

## 2016-05-06 MED ORDER — CLINDAMYCIN PHOSPHATE 900 MG/50ML IV SOLN
900.0000 mg | INTRAVENOUS | Status: AC
  Administered 2016-05-06: 900 mg via INTRAVENOUS
  Filled 2016-05-06: qty 50

## 2016-05-06 MED ORDER — CIPROFLOXACIN IN D5W 400 MG/200ML IV SOLN
400.0000 mg | INTRAVENOUS | Status: AC
  Administered 2016-05-06: 400 mg via INTRAVENOUS
  Filled 2016-05-06: qty 200

## 2016-05-06 MED ORDER — MIDAZOLAM HCL 2 MG/2ML IJ SOLN
INTRAMUSCULAR | Status: AC
  Filled 2016-05-06: qty 2

## 2016-05-06 MED ORDER — FAMOTIDINE IN NACL 20-0.9 MG/50ML-% IV SOLN
20.0000 mg | Freq: Once | INTRAVENOUS | Status: AC
  Administered 2016-05-06: 20 mg via INTRAVENOUS

## 2016-05-06 SURGICAL SUPPLY — 21 items
CANISTER SUCT 3000ML (MISCELLANEOUS) ×4 IMPLANT
CLOTH BEACON ORANGE TIMEOUT ST (SAFETY) ×4 IMPLANT
CONT PATH 16OZ SNAP LID 3702 (MISCELLANEOUS) IMPLANT
DECANTER SPIKE VIAL GLASS SM (MISCELLANEOUS) IMPLANT
GAUZE PACKING 2X5 YD STRL (GAUZE/BANDAGES/DRESSINGS) IMPLANT
GLOVE BIO SURGEON STRL SZ7 (GLOVE) ×4 IMPLANT
GLOVE BIOGEL PI IND STRL 7.0 (GLOVE) ×2 IMPLANT
GLOVE BIOGEL PI INDICATOR 7.0 (GLOVE) ×4
GOWN STRL REUS W/TWL LRG LVL3 (GOWN DISPOSABLE) ×8 IMPLANT
NEEDLE HYPO 22GX1.5 SAFETY (NEEDLE) ×4 IMPLANT
NS IRRIG 1000ML POUR BTL (IV SOLUTION) ×4 IMPLANT
PACK VAGINAL WOMENS (CUSTOM PROCEDURE TRAY) ×4 IMPLANT
SUT PDS AB 0 CT1 27 (SUTURE) ×4 IMPLANT
SUT PDS AB 2-0 CT1 27 (SUTURE) ×4 IMPLANT
SUT VIC AB 0 CT1 18XCR BRD8 (SUTURE) ×2 IMPLANT
SUT VIC AB 0 CT1 8-18 (SUTURE)
SUT VIC AB 2-0 CT1 27 (SUTURE)
SUT VIC AB 2-0 CT1 TAPERPNT 27 (SUTURE) ×2 IMPLANT
TOWEL OR 17X24 6PK STRL BLUE (TOWEL DISPOSABLE) ×8 IMPLANT
TRAY FOLEY CATH SILVER 14FR (SET/KITS/TRAYS/PACK) ×4 IMPLANT
WATER STERILE IRR 1000ML POUR (IV SOLUTION) ×4 IMPLANT

## 2016-05-06 NOTE — MAU Note (Signed)
Patient to mau for preparation for OR: vaginal cuff repair.

## 2016-05-06 NOTE — H&P (Signed)
36 y.o. yo Post op day 16 from uncomplicated robotic hysterectomy.   Yesterday pt c/o some pinkish d/c from vagina.  Spec exam was intact with some d/c but no active bleeding.  US done showed no hematoma or abscess at cuff.  Past Medical History:  Diagnosis Date  . Anemia   . Anxiety   . Chest pain    Went to ER, EKG normal, due to anxiety per patient  . GERD (gastroesophageal reflux disease)   . Headache    migraines  . Nephrolithiasis    patient denies    Past Surgical History:  Procedure Laterality Date  . CHOLECYSTECTOMY    . CYSTOSCOPY N/A 04/20/2016   Procedure: CYSTOSCOPY;  Surgeon: Bobbye Charleston, MD;  Location: [facility];  Service: Gynecology;  Laterality: N/A;  . ROBOTIC ASSISTED TOTAL HYSTERECTOMY WITH BILATERAL SALPINGO OOPHERECTOMY N/A 04/20/2016   Procedure: ROBOTIC ASSISTED TOTAL HYSTERECTOMY WITH BILATERAL SALPINGO OOPHORECTOMY;  Surgeon: Bobbye Charleston, MD;  Location: [facility];  Service: Gynecology;  Laterality: N/A;  . WISDOM TOOTH EXTRACTION      Social History   Social History  . Marital status: Legally Separated    Spouse name: N/A  . Number of children: N/A  . Years of education: N/A   Occupational History  . Not on file.   Social History Main Topics  . Smoking status: Current Every Day Smoker    Packs/day: 1.00    Years: 8.00    Types: Cigarettes  . Smokeless tobacco: Never Used  . Alcohol use No  . Drug use: No  . Sexual activity: Not on file   Other Topics Concern  . Not on file   Social History Narrative  . No narrative on file    No current facility-administered medications on file prior to encounter.    Current Outpatient Prescriptions on File Prior to Encounter  Medication Sig Dispense Refill  . oxyCODONE (ROXICODONE) 5 MG/5ML solution Take 5-10 mLs (5-10 mg total) by mouth every 4 (four) hours as needed (modererate to severe pain (when tolerating fluids)). 473 mL 0  . ibuprofen (ADVIL,MOTRIN) 100 MG/5ML suspension Take 40 mLs (800 mg  total) by mouth every 8 (eight) hours as needed for mild pain. (Patient not taking: Reported on 05/06/2016) 237 mL 2  . ondansetron (ZOFRAN ODT) 4 MG disintegrating tablet Take 1 tablet (4 mg total) by mouth every 8 (eight) hours as needed for nausea or vomiting. (Patient not taking: Reported on 04/07/2016) 20 tablet 0    Allergies  Allergen Reactions  . Penicillins Hives    Has patient had a PCN reaction causing immediate rash, facial/tongue/throat swelling, SOB or lightheadedness with hypotension: Yes Has patient had a PCN reaction causing severe rash involving mucus membranes or skin necrosis: No Has patient had a PCN reaction that required hospitalization: No Has patient had a PCN reaction occurring within the last 10 years: No If all of the above answers are "NO", then may proceed with Cephalosporin use.   . Other     CAN ONLY TAKE LIQUID MEDICATION NO PILLS    @VITALS2 @  Lungs: clear to ascultation Cor:  RRR Abdomen:  soft, nontender, nondistended. Ex:  no cords, erythema Pelvic:  Vaginal cuff with healthy edges and no active bleeding.  3 cm defect in middle where stitches are seen stretched but in situ and ovary can be seen.  D/c coming from above.  On L side, stitches stretched but no hole seen.  A:  Cuff dehiscence without bowel extrusion.  To OR for repair.  Consented for laparoscopy in case but ovaries appear to be blocking bowel.  Can do with spinal.    P: All risks, benefits and alternatives d/w patient and she desires to proceed.  Patient will receive preop antibiotics and SCDs during the operation.     Scout Guyett A

## 2016-05-06 NOTE — Anesthesia Procedure Notes (Signed)
Spinal  Patient location during procedure: OR Staffing Anesthesiologist: Lyndle Herrlich Preanesthetic Checklist Completed: patient identified, site marked, surgical consent, pre-op evaluation, timeout performed, IV checked, risks and benefits discussed and monitors and equipment checked Spinal Block Patient position: sitting Prep: DuraPrep Patient monitoring: heart rate, cardiac monitor, continuous pulse ox and blood pressure Approach: midline Location: L3-4 Injection technique: single-shot Needle Needle type: Pencil-Tip  Needle gauge: 25 G Needle length: 9 cm Assessment Sensory level: T4 Additional Notes Spinal Dosage in OR  .75% Bupivicaine ml       1.4 Lab work and procedure  confirmed with CRNA in room; platelets okay. Discussed spinal anesthetic, and patient consents to the procedure:  included risk of possible headache,backache, failed block, allergic reaction, and nerve injury  The patient was asked if there were any questions or concerns before the procedure started.

## 2016-05-06 NOTE — Transfer of Care (Signed)
Immediate Anesthesia Transfer of Care Note  Patient: Angela Hartman  Procedure(s) Performed: Procedure(s): Repair Vaginal Cuff Dehisance, (N/A) CYSTOSCOPY  Patient Location: PACU  Anesthesia Type:Spinal  Level of Consciousness: awake, alert , oriented and patient cooperative  Airway & Oxygen Therapy: Patient Spontanous Breathing  Post-op Assessment: Report given to RN and Post -op Vital signs reviewed and stable  Post vital signs: Reviewed and stable NSR 81, spo2 97% on RA, NIBP 97/58  Last Vitals:  Vitals:   05/06/16 1720  BP: 124/69  Pulse: 97  Resp: 18    Last Pain:  Vitals:   05/06/16 1726  PainSc: 1          Complications: No apparent anesthesia complications

## 2016-05-06 NOTE — Op Note (Addendum)
05/06/2016  7:15 PM  PATIENT:  Angela Hartman  36 y.o. female  PRE-OPERATIVE DIAGNOSIS:  vaginal cuff dehiscence  POST-OPERATIVE DIAGNOSIS:  vaginal cuff dehiscnece  PROCEDURE:  Procedure(s): Repair Vaginal Cuff Dehisence, (N/A) CYSTOSCOPY  SURGEON:  Surgeon(s) and Role:    * Bobbye Charleston, MD - Primary    * Jerelyn Charles, MD - Assisting  ANESTHESIA:   spinal  EBL:  Total I/O In: 1500 [I.V.:1500] Out: 10 [Urine:10]  SPECIMEN:  No Specimen  DISPOSITION OF SPECIMEN:  PATHOLOGY  COUNTS:  YES  TOURNIQUET:  * No tourniquets in log *  DICTATION: .Note written in EPIC  PLAN OF CARE: Discharge to home after PACU  PATIENT DISPOSITION:  PACU - hemodynamically stable.   Delay start of Pharmacological VTE agent (>24hrs) due to surgical blood loss or risk of bleeding: not applicable  Complications: small bleeder of lower pedicles began bleeding with exploration of cuff.  A stitch was placed to secure it.  Cystoscopy was done to make sure bladder and ureter was not compromised by the stitch.  Meds: cipro and clinda  Findings: Cuff open to peritoneal cavity.  Ovaries seen at top of cuff and bowel no where seen in the field of dissection.  Small bleeder secured with stitch after exploring cuff angle.  Before closing cuff pt was placed in reverse trendelenburg to make sure no other bleeding could be seen running down.  Cystoscopy showed bilateral strong spill of ureters and intact bladder without stitches.   Technique:  Pt was administered spinal anesthesia and perineum and vagina carefully and fully prepped with betadine.  A foley was placed.  A weighted speculum and deaver were placed in the vagina for exposure.  The above findings were noted and the bleeding vessel was secured with a stitch of 2-0 PDS.  Once hemostasis was achieved, the cuff was closed with 3 figure of eight stitches with 0 PDS.  The cuff was intact and not bleeding.  Cysto was performed with the above results  and good ureteral spill bilaterally.  The vagina was checked again and was hemostatic and intact.  Didgital exam also revealed intact vagina.  Pt was returned to the recovery room in stable condition with the foley back in place.

## 2016-05-06 NOTE — Anesthesia Preprocedure Evaluation (Signed)
Anesthesia Evaluation   Patient awake    Reviewed: Allergy & Precautions, NPO status , Patient's Chart, lab work & pertinent test results  Airway Mallampati: II  TM Distance: >3 FB Neck ROM: Full    Dental  (+) Dental Advisory Given   Pulmonary Current Smoker,    breath sounds clear to auscultation       Cardiovascular negative cardio ROS   Rhythm:Regular Rate:Normal     Neuro/Psych  Headaches, Anxiety    GI/Hepatic Neg liver ROS,   Endo/Other  negative endocrine ROS  Renal/GU negative Renal ROS     Musculoskeletal   Abdominal   Peds  Hematology  (+) anemia ,   Anesthesia Other Findings   Reproductive/Obstetrics                             Lab Results  Component Value Date   WBC 11.6 (H) 05/06/2016   HGB 11.7 (L) 05/06/2016   HCT 36.9 05/06/2016   MCV 81.8 05/06/2016   PLT 734 (H) 05/06/2016   Lab Results  Component Value Date   CREATININE 0.87 12/06/2015   BUN 13 12/06/2015   NA 136 12/06/2015   K 3.4 (L) 12/06/2015   CL 107 12/06/2015   CO2 18 (L) 12/06/2015    Anesthesia Physical  Anesthesia Plan  ASA: II  Anesthesia Plan: Spinal   Post-op Pain Management:    Induction:   Airway Management Planned:   Additional Equipment:   Intra-op Plan:   Post-operative Plan:   Informed Consent: I have reviewed the patients History and Physical, chart, labs and discussed the procedure including the risks, benefits and alternatives for the proposed anesthesia with the patient or authorized representative who has indicated his/her understanding and acceptance.   Dental advisory given  Plan Discussed with: CRNA  Anesthesia Plan Comments:         Anesthesia Quick Evaluation

## 2016-05-06 NOTE — MAU Note (Signed)
Pt presents to MAU to be prepped for the OR. Pt states she started bleeding around 1400 and was seen in the office. Dr. Philis Pique sent pt to MAU for cuff repair.

## 2016-05-06 NOTE — Brief Op Note (Signed)
05/06/2016  7:15 PM  PATIENT:  Angela Hartman  36 y.o. female  PRE-OPERATIVE DIAGNOSIS:  vaginal cuff dehis  POST-OPERATIVE DIAGNOSIS:  vaginal cuff dehis  PROCEDURE:  Procedure(s): Repair Vaginal Cuff Dehisance, (N/A) CYSTOSCOPY  SURGEON:  Surgeon(s) and Role:    * Bobbye Charleston, MD - Primary    * Jerelyn Charles, MD - Assisting  ANESTHESIA:   spinal  EBL:  Total I/O In: 1500 [I.V.:1500] Out: 10 [Urine:10]  SPECIMEN:  No Specimen  DISPOSITION OF SPECIMEN:  PATHOLOGY  COUNTS:  YES  TOURNIQUET:  * No tourniquets in log *  DICTATION: .Note written in EPIC  PLAN OF CARE: Discharge to home after PACU  PATIENT DISPOSITION:  PACU - hemodynamically stable.   Delay start of Pharmacological VTE agent (>24hrs) due to surgical blood loss or risk of bleeding: not applicable  Complications: small bleeder of lower pedicles began bleeding with exploration of cuff.  A stitch was placed to secure it.  Cystoscopy was done to make sure bladder and ureter was not compromised by the stitch.  Meds: cipro and clinda  Findings: Cuff open to peritoneal cavity.  Ovaries seen at top of cuff and bowel no where seen in the field of dissection.  Small bleeder secured with stitch after exploring cuff angle.  Before closing cuff pt was placed in reverse trendelenburg to make sure no other bleeding could be seen running down.  Cystoscopy showed bilateral strong spill of ureters and intact bladder without stitches.   Technique:  Pt was administered spinal anesthesia and perineum and vagina carefully and fully prepped with betadine.  A foley was placed.  A weighted speculum and deaver were placed in the vagina for exposure.  The above findings were noted and the bleeding vessel was secured with a stitch of 2-0 PDS.  Once hemostasis was achieved, the cuff was closed with 3 figure of eight stitches with 0 PDS.  The cuff was intact and not bleeding.  Cysto was performed with the above results and good  ureteral spill bilaterally.  The vagina was checked again and was hemostatic and intact.  Didgital exam also revealed intact vagina.  Pt was returned to the recovery room in stable condition with the foley back in place.

## 2016-05-07 ENCOUNTER — Encounter (HOSPITAL_COMMUNITY): Payer: Self-pay | Admitting: Obstetrics and Gynecology

## 2016-05-09 NOTE — Anesthesia Postprocedure Evaluation (Signed)
Anesthesia Post Note  Patient: Angela Hartman  Procedure(s) Performed: Procedure(s) (LRB): Repair Vaginal Cuff Dehisance, (N/A) CYSTOSCOPY  Patient location during evaluation: PACU Anesthesia Type: Spinal Level of consciousness: awake Pain management: satisfactory to patient Vital Signs Assessment: post-procedure vital signs reviewed and stable Respiratory status: spontaneous breathing Cardiovascular status: blood pressure returned to baseline Postop Assessment: no headache and spinal receding Anesthetic complications: no        Last Vitals:  Vitals:   05/06/16 2030 05/06/16 2119  BP: (!) 95/46 101/68  Pulse: 67 66  Resp: 17 (!) 24  Temp: 36.9 C 36.8 C    Last Pain:  Vitals:   05/06/16 1726  PainSc: 1    Pain Goal:                 [name] EDWARD

## 2016-09-02 NOTE — Addendum Note (Signed)
Addendum  created 09/02/16 1422 by Lyndle Herrlich, MD   Sign clinical note

## 2016-09-02 NOTE — Anesthesia Postprocedure Evaluation (Signed)
Anesthesia Post Note  Patient: Angela Hartman  Procedure(s) Performed: Procedure(s) (LRB): Repair Vaginal Cuff Dehisance, (N/A) CYSTOSCOPY     Anesthesia Post Evaluation  Last Vitals:  Vitals:   05/06/16 2030 05/06/16 2119  BP: (!) 95/46 101/68  Pulse: 67 66  Resp: 17 (!) 24  Temp: 36.9 C 36.8 C    Last Pain:  Vitals:   05/06/16 1726  PainSc: 1                  Altonio Schwertner EDWARD

## 2017-12-20 ENCOUNTER — Ambulatory Visit: Payer: 59 | Admitting: Gastroenterology

## 2017-12-20 ENCOUNTER — Encounter: Payer: Self-pay | Admitting: Gastroenterology

## 2017-12-20 VITALS — BP 112/68 | HR 88 | Ht 65.75 in | Wt 188.2 lb

## 2017-12-20 DIAGNOSIS — R1310 Dysphagia, unspecified: Secondary | ICD-10-CM

## 2017-12-20 DIAGNOSIS — Z791 Long term (current) use of non-steroidal anti-inflammatories (NSAID): Secondary | ICD-10-CM

## 2017-12-20 DIAGNOSIS — R1012 Left upper quadrant pain: Secondary | ICD-10-CM | POA: Diagnosis not present

## 2017-12-20 DIAGNOSIS — K219 Gastro-esophageal reflux disease without esophagitis: Secondary | ICD-10-CM

## 2017-12-20 DIAGNOSIS — K5900 Constipation, unspecified: Secondary | ICD-10-CM

## 2017-12-20 MED ORDER — SUCRALFATE 1 GM/10ML PO SUSP: 1.0000 g | Freq: Three times a day (TID) | ORAL | 1 refills | Status: DC

## 2017-12-20 NOTE — Patient Instructions (Addendum)
You have been scheduled for an endoscopy. Please follow written instructions given to you at your visit today. If you use inhalers (even only as needed), please bring them with you on the day of your procedure. Your physician has requested that you go to www.startemmi.com and enter the access code given to you at your visit today. This web site gives a general overview about your procedure. However, you should still follow specific instructions given to you by our office regarding your preparation for the procedure.  Avoid NSAID's such as ibuprofen, Advil, Goody's and aspirin.   We have sent the following medications to your pharmacy for you to pick up at your convenience: Carafate.

## 2017-12-20 NOTE — Progress Notes (Addendum)
Angela Hartman    [id number]    04-19-1980  Primary Care Physician:System, Pcp Not In  Referring Physician: No referring provider defined for this encounter.  Chief complaint:  Dysphagia  HPI: 37 year old female here for new patient visit with complaints of worsening solid dysphagia in the last 2 weeks.  Her symptoms started 2 weeks ago when she felt food getting hung up in the throat and since then she feels food getting hung up before it passes down.  Last week she was feeling saliva poolling in the back of her throat and had to spit in a cup.  She is no longer doing that. She has had chronic heartburn and was taking Tums as needed intermittently but in the past 2 weeks Tums are not helping she has to take 3 or 4 times a day with no improvement of symptoms.  She also has significant left upper abdomen and epigastric discomfort.  Denies nausea, vomiting, melena or blood in stool. History of chronic constipation, has bowel movement once every 2 to 3 days.  Denies any weight loss, she has gained 25 to 30 pounds after hysterectomy in the past year.  No loss of appetite.    She has been taking Goody powder almost daily for chronic headaches  No family history of GI malignancy or colon cancer.   Outpatient Encounter Medications as of 12/20/2017  Medication Sig  . [DISCONTINUED] ibuprofen (ADVIL,MOTRIN) 100 MG/5ML suspension Take 40 mLs (800 mg total) by mouth every 8 (eight) hours as needed for mild pain. (Patient not taking: Reported on 05/06/2016)  . [DISCONTINUED] metronidazole (FLAGYL) 375 MG capsule Take 1 capsule (375 mg total) by mouth 4 (four) times daily.  . [DISCONTINUED] ondansetron (ZOFRAN ODT) 4 MG disintegrating tablet Take 1 tablet (4 mg total) by mouth every 8 (eight) hours as needed for nausea or vomiting. (Patient not taking: Reported on 04/07/2016)  . [DISCONTINUED] oxyCODONE (ROXICODONE) 5 MG/5ML solution Take 5-10 mLs (5-10 mg total) by mouth every 4 (four)  hours as needed (modererate to severe pain (when tolerating fluids)).   No facility-administered encounter medications on file as of 12/20/2017.     Allergies as of 12/20/2017 - Review Complete 12/20/2017  Allergen Reaction Noted  . Penicillins Hives 06/19/2012  . Other  04/12/2016    Past Medical History:  Diagnosis Date  . Anemia   . Anxiety   . Chest pain    Went to ER, EKG normal, due to anxiety per patient  . GERD (gastroesophageal reflux disease)   . Headache    migraines  . Nephrolithiasis    patient denies     Past Surgical History:  Procedure Laterality Date  . CHOLECYSTECTOMY    . CYSTOCELE REPAIR N/A 05/06/2016   Procedure: Repair Vaginal Cuff Dehisance,;  Surgeon: Bobbye Charleston, MD;  Location: [facility];  Service: Gynecology;  Laterality: N/A;  . CYSTOSCOPY N/A 04/20/2016   Procedure: CYSTOSCOPY;  Surgeon: Bobbye Charleston, MD;  Location: [facility];  Service: Gynecology;  Laterality: N/A;  . CYSTOSCOPY  05/06/2016   Procedure: CYSTOSCOPY;  Surgeon: Bobbye Charleston, MD;  Location: [facility];  Service: Gynecology;;  . ROBOTIC ASSISTED TOTAL HYSTERECTOMY WITH BILATERAL SALPINGO OOPHERECTOMY N/A 04/20/2016   Procedure: ROBOTIC ASSISTED TOTAL HYSTERECTOMY WITH BILATERAL SALPINGO OOPHORECTOMY;  Surgeon: Bobbye Charleston, MD;  Location: [facility];  Service: Gynecology;  Laterality: N/A;  . WISDOM TOOTH EXTRACTION      Family History  Problem Relation Age of  Onset  . Heart attack Father     Social History   Socioeconomic History  . Marital status: Legally Separated    Spouse name: Not on file  . Number of children: 2  . Years of education: Not on file  . Highest education level: Not on file  Occupational History  . 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 Financial resource strain: Not on file  . Food insecurity:    Worry: Not on file    Inability: Not on file  . Transportation needs:    Medical: Not on file    Non-medical: Not on file  Tobacco Use  . Smoking  status: Current Every Day Smoker    Packs/day: 1.00    Years: 8.00    Pack years: 8.00    Types: Cigarettes  . Smokeless tobacco: Never Used  Substance and Sexual Activity  . Alcohol use: No  . Drug use: No  . Sexual activity: Not on file  Lifestyle  . Physical activity:    Days per week: Not on file    Minutes per session: Not on file  . Stress: Not on file  Relationships  . Social connections:    Talks on phone: Not on file    Gets together: Not on file    Attends religious service: Not on file    Active member of club or organization: Not on file    Attends meetings of clubs or organizations: Not on file    Relationship status: Not on file  . Intimate partner violence:    Fear of current or ex partner: Not on file    Emotionally abused: Not on file    Physically abused: Not on file    Forced sexual activity: Not on file  Other Topics Concern  . Not on file  Social History Narrative  . Not on file      Review of systems: Review of Systems  Constitutional: Negative for fever and chills.  HENT: Negative.   Eyes: Negative for blurred vision.  Respiratory: Negative for cough, shortness of breath and wheezing.   Cardiovascular: Negative for chest pain and palpitations.  Gastrointestinal: as per HPI Genitourinary: Negative for dysuria, urgency, frequency and hematuria.  Musculoskeletal: Negative for myalgias, back pain and joint pain.  Skin: Negative for itching and rash.  Neurological: Negative for dizziness, tremors, focal weakness, seizures and loss of consciousness. Positive for chronic headaches Endo/Heme/Allergies: Negative for seasonal allergies.  Psychiatric/Behavioral: Negative for depression, suicidal ideas and hallucinations.  All other systems reviewed and are negative.   Physical Exam: Vitals:   12/20/17 1108  BP: 112/68  Pulse: 88   Body mass index is 30.62 kg/m. Gen:      No acute distress HEENT:  EOMI, sclera anicteric Neck:     No masses; no  thyromegaly Lungs:    Clear to auscultation bilaterally; normal respiratory effort CV:         Regular rate and rhythm; no murmurs Abd:      + bowel sounds; soft, non-tender; no palpable masses, no distension Ext:    No edema; adequate peripheral perfusion Skin:      Warm and dry; no rash Neuro: alert and oriented x 3 Psych: normal mood and affect  Data Reviewed:  Reviewed labs, radiology imaging, old records and pertinent past GI work up   Assessment and Plan/Recommendations:  37 year old female status post cholecystectomy, total hysterectomy with bilateral salpingo-oophorectomy with history of chronic GERD, chronic constipation with complaints of  worsening dysphagia. Based on description of patient's symptoms she likely had an episode of food impaction 2 weeks ago, that has since resolved. Will schedule for  EGD to evaluate for severe esophagitis, peptic stricture or neoplastic lesion, also obtain esophageal biopsies and esophageal dilation if needed. Will hold off starting PPI as unclear how tight esophageal stricture is to avoid pill impaction. Carafate 1 g suspension before meals and at bedtime as needed Antireflux measures Left upper quadrant abdominal pain concerning for gastric ulcer in the setting of chronic NSAID use, will evaluate further with EGD Chronic constipation: Discussed increasing water intake to 60 to 80 ounces daily and start Benefiber 1 teaspoon 3 times daily with meals The risks and benefits as well as alternatives of endoscopic procedure(s) have been discussed and reviewed. All questions answered. The patient agrees to proceed.     Damaris Hippo , MD [phone]    CC: No ref. provider found

## 2017-12-21 ENCOUNTER — Ambulatory Visit (AMBULATORY_SURGERY_CENTER): Payer: 59 | Admitting: Gastroenterology

## 2017-12-21 ENCOUNTER — Encounter: Payer: Self-pay | Admitting: Gastroenterology

## 2017-12-21 VITALS — BP 112/61 | HR 64 | Temp 97.7°F | Resp 21 | Ht 65.75 in | Wt 188.0 lb

## 2017-12-21 DIAGNOSIS — R1310 Dysphagia, unspecified: Secondary | ICD-10-CM

## 2017-12-21 DIAGNOSIS — K2210 Ulcer of esophagus without bleeding: Secondary | ICD-10-CM

## 2017-12-21 DIAGNOSIS — K2980 Duodenitis without bleeding: Secondary | ICD-10-CM | POA: Diagnosis not present

## 2017-12-21 DIAGNOSIS — R1012 Left upper quadrant pain: Secondary | ICD-10-CM

## 2017-12-21 DIAGNOSIS — K449 Diaphragmatic hernia without obstruction or gangrene: Secondary | ICD-10-CM | POA: Diagnosis not present

## 2017-12-21 DIAGNOSIS — K210 Gastro-esophageal reflux disease with esophagitis: Secondary | ICD-10-CM

## 2017-12-21 DIAGNOSIS — R1319 Other dysphagia: Secondary | ICD-10-CM

## 2017-12-21 MED ORDER — OMEPRAZOLE 2 MG/ML ORAL SUSPENSION: 40.0000 mg | Freq: Two times a day (BID) | ORAL | Status: DC

## 2017-12-21 MED ORDER — SODIUM CHLORIDE 0.9 % IV SOLN: 500.0000 mL | Freq: Once | INTRAVENOUS | Status: DC

## 2017-12-21 MED ORDER — OMEPRAZOLE 2 MG/ML ORAL SUSPENSION: 40.0000 mg | Freq: Four times a day (QID) | ORAL | 0 refills | Status: DC

## 2017-12-21 MED ORDER — SUCRALFATE 1 G PO TABS: 1.0000 g | ORAL_TABLET | Freq: Four times a day (QID) | ORAL | 0 refills | Status: DC

## 2017-12-21 NOTE — Progress Notes (Signed)
A and O x3. Report to RN. Tolerated MAC anesthesia well.Teeth unchanged after procedure.

## 2017-12-21 NOTE — Progress Notes (Signed)
Spoke to patinet's friend and he said that the scripts were not at the pharmacy.  After investigation, one of the scripts was only able to be filled at [facility] because it was a compound.  I left a message for the friend and the patient to call me to confirm. Dr. Silverio Decamp did state that if the patient did want carafate pills that they would be okay.  Dr. Lenon Ahmadi that the patient didn't like pills at this time.

## 2017-12-21 NOTE — Patient Instructions (Signed)
Thank you for allowing [place] to care for you today!  Resume previous diet and medications with addition of Prilosec (Omeprazole) liquid suspension 40 mg by mouth twice per day x 3 months.  Also start sucralfate suspension 1 gram by mouth four times per day x 1 month.  Prescriptions forwarded to your pharmacy.  Follow anti-reflux regimen indefinitely.  See report for recommendations.  Repeat upper endoscopy in 4-6 weeks to check the healing and for possible dilation.     YOU HAD AN ENDOSCOPIC PROCEDURE TODAY AT [facility]:   Refer to the procedure report that was given to you for any specific questions about what was found during the examination.  If the procedure report does not answer your questions, please call your gastroenterologist to clarify.  If you requested that your care partner not be given the details of your procedure findings, then the procedure report has been included in a sealed envelope for you to review at your convenience later.  YOU SHOULD EXPECT: Some feelings of bloating in the abdomen. Passage of more gas than usual.  Walking can help get rid of the air that was put into your GI tract during the procedure and reduce the bloating. If you had a lower endoscopy (such as a colonoscopy or flexible sigmoidoscopy) you may notice spotting of blood in your stool or on the toilet paper. If you underwent a bowel prep for your procedure, you may not have a normal bowel movement for a few days.  Please Note:  You might notice some irritation and congestion in your nose or some drainage.  This is from the oxygen used during your procedure.  There is no need for concern and it should clear up in a day or so.  SYMPTOMS TO REPORT IMMEDIATELY:   Following lower endoscopy (colonoscopy or flexible sigmoidoscopy):  Excessive amounts of blood in the stool  Significant tenderness or worsening of abdominal pains  Swelling of the abdomen that is new, acute  Fever of 100F or  higher   For urgent or emergent issues, a gastroenterologist can be reached at any hour by calling [phone].   DIET:  We do recommend a small meal at first, but then you may proceed to your regular diet.  Drink plenty of fluids but you should avoid alcoholic beverages for 24 hours.  ACTIVITY:  You should plan to take it easy for the rest of today and you should NOT DRIVE or use heavy machinery until tomorrow (because of the sedation medicines used during the test).    FOLLOW UP: Our staff will call the number listed on your records the next business day following your procedure to check on you and address any questions or concerns that you may have regarding the information given to you following your procedure. If we do not reach you, we will leave a message.  However, if you are feeling well and you are not experiencing any problems, there is no need to return our call.  We will assume that you have returned to your regular daily activities without incident.  If any biopsies were taken you will be contacted by phone or by letter within the next 1-3 weeks.  Please call us at [phone] if you have not heard about the biopsies in 3 weeks.    SIGNATURES/CONFIDENTIALITY: You and/or your care partner have signed paperwork which will be entered into your electronic medical record.  These signatures attest to the fact that that the information  above on your After Visit Summary has been reviewed and is understood.  Full responsibility of the confidentiality of this discharge information lies with you and/or your care-partner. 

## 2017-12-21 NOTE — Addendum Note (Signed)
Addended by: Ernestine Conrad D on: 12/21/2017 01:46 PM   Modules accepted: Orders

## 2017-12-21 NOTE — Op Note (Addendum)
[facility] Patient Name: Angela Hartman Procedure Date: 12/21/2017 11:42 AM MRN: [id number] Endoscopist: Mauri Pole , MD Age: 37 Referring MD:  Date of Birth: [birth year] Gender: Female Account #: [id number] Procedure:                Upper GI endoscopy Indications:              Dysphagia, Epigastric abdominal pain, Abdominal                            pain in the left upper quadrant Medicines:                Monitored Anesthesia Care Procedure:                Pre-Anesthesia Assessment:                           - Prior to the procedure, a History and Physical                            was performed, and patient medications and                            allergies were reviewed. The patient's tolerance of                            previous anesthesia was also reviewed. The risks                            and benefits of the procedure and the sedation                            options and risks were discussed with the patient.                            All questions were answered, and informed consent                            was obtained. Prior Anticoagulants: The patient has                            taken no previous anticoagulant or antiplatelet                            agents. ASA Grade Assessment: II - A patient with                            mild systemic disease. After reviewing the risks                            and benefits, the patient was deemed in                            satisfactory condition to undergo the procedure.  After obtaining informed consent, the endoscope was                            passed under direct vision. Throughout the                            procedure, the patient's blood pressure, pulse, and                            oxygen saturations were monitored continuously. The                            Endoscope was introduced through the mouth, and                            advanced to the  second part of duodenum. The upper                            GI endoscopy was accomplished without difficulty.                            The patient tolerated the procedure well. Scope In: Scope Out: Findings:                 LA Grade D (one or more mucosal breaks involving at                            least 75% of esophageal circumference) esophagitis                            with no bleeding was found 30 to 36 cm from the                            incisors.                           Few cratered and linear esophageal ulcers with no                            bleeding were found 30 to 36 cm from the incisors.                            The largest lesion was 6 mm in largest dimension.                           A small hiatal hernia was present.                           Patchy severe inflammation characterized by                            congestion (edema), erosions, erythema, aphthous  ulcerations and shallow ulcerations was found in                            the gastric antrum. Biopsies were taken with a cold                            forceps for Helicobacter pylori testing.                           Patchy moderate inflammation characterized by                            congestion (edema) and erythema was found in the                            duodenal bulb.                           The second portion of the duodenum was normal. Complications:            No immediate complications. Estimated Blood Loss:     Estimated blood loss was minimal. Impression:               - LA Grade D reflux and erosive esophagitis.                           - Non-bleeding esophageal ulcers.                           - Small hiatal hernia.                           - Gastritis. Biopsied.                           - Duodenitis.                           - Normal second portion of the duodenum. Recommendation:           - Patient has a contact number available for                             emergencies. The signs and symptoms of potential                            delayed complications were discussed with the                            patient. Return to normal activities tomorrow.                            Written discharge instructions were provided to the                            patient.                           -  Mechanical soft diet.                           - Continue present medications.                           - Use Prilosec (omeprazole) liquid suspension 40 mg                            PO BID for 3 months.                           - Use sucralfate suspension 1 gram PO QID for 1                            month.                           - Follow an antireflux regimen indefinitely. This                            includes:                           - Do not lie down for at least 3 to 4 hours after                            meals.                           - Raise the head of the bed 4 to 6 inches.                           - Decrease excess weight.                           - Avoid citrus juices and other acidic foods,                            alcohol, chocolate, mints, coffee and other                            caffeinated beverages, carbonated beverages, fatty                            and fried foods.                           - Avoid tight-fitting clothing.                           - Avoid cigarettes and other tobacco products.                           - Repeat upper endoscopy in 4 to 6 weeks to check  healing and possible esophageal dilation Mauri Pole, MD 12/21/2017 12:04:44 PM This report has been signed electronically.

## 2017-12-21 NOTE — Progress Notes (Signed)
Called to room to assist during endoscopic procedure.  Patient ID and intended procedure confirmed with present staff. Received instructions for my participation in the procedure from the performing physician.  

## 2017-12-22 ENCOUNTER — Other Ambulatory Visit: Payer: Self-pay

## 2017-12-22 ENCOUNTER — Telehealth: Payer: Self-pay

## 2017-12-22 ENCOUNTER — Telehealth: Payer: Self-pay | Admitting: Gastroenterology

## 2017-12-22 DIAGNOSIS — R1310 Dysphagia, unspecified: Secondary | ICD-10-CM

## 2017-12-22 DIAGNOSIS — R1319 Other dysphagia: Secondary | ICD-10-CM

## 2017-12-22 MED ORDER — ESOMEPRAZOLE MAGNESIUM 20 MG PO PACK: 20.0000 mg | PACK | Freq: Two times a day (BID) | ORAL | 12 refills | Status: DC

## 2017-12-22 NOTE — Telephone Encounter (Signed)
Ok to switch to Nexium 40mg  BID powder. Follow up in office visit

## 2017-12-22 NOTE — Telephone Encounter (Signed)
Carafate suspension was covered. She is taking that. The Omeprazole has to be compounded. But Omeprazole is not covered by her insurance. The boyfriend says Nexium in the packets is covered. Want me to change it to Nexium 40 mg?

## 2017-12-22 NOTE — Telephone Encounter (Signed)
  Follow up Call-  Call back number 12/21/2017  Post procedure Call Back phone  # [phone] cell  Permission to leave phone message Yes  Some recent data might be hidden    Left message

## 2017-12-22 NOTE — Telephone Encounter (Signed)
Pt's boyfriend Merry Proud calling again, he stated that insurance does not cover omeprazole so wants to know if there is a similar med that pt can have.

## 2017-12-22 NOTE — Telephone Encounter (Signed)
We sent new Rx for nexium. Thanks

## 2017-12-22 NOTE — Telephone Encounter (Signed)
New Rx sent to the pharmacy. Notified and instructed the significant other of the patient.

## 2017-12-22 NOTE — Telephone Encounter (Signed)
  Follow up Call-  Call back number 12/21/2017  Post procedure Call Back phone  # [phone] cell  Permission to leave phone message Yes  Some recent data might be hidden     Patient questions:  Do you have a fever, pain , or abdominal swelling? No. Pain Score  0 *  Have you tolerated food without any problems? No.  Have you been able to return to your normal activities? No.  Do you have any questions about your discharge instructions: Diet   No. Medications  Yes.   Follow up visit  No.  Do you have questions or concerns about your Care? Yes.  Patient c/o nausea since beginning Sucralfate last evening, no vomiting. She also states she was unable to fill her prescription for Prilosec (Omeprazole) liquid suspension because it is not covered by her insurance. Please advise. Thank you.  Actions: * If pain score is 4 or above: No action needed, pain <4.

## 2017-12-23 ENCOUNTER — Telehealth: Payer: Self-pay | Admitting: Gastroenterology

## 2017-12-23 NOTE — Telephone Encounter (Signed)
Received a call from Donia Ast regarding this patient.  States that she is having a lot of back pain and leg pain for which she used to take Goody's but due to results of recent EGD has been told not to take them.  Having continued abdominal pain as well, likely related to esophagitis, esophageal ulcers that were seen on EGD.  They are wanting to know what else she can take for pain.  Recommended Tylenol 1000 mg every 6 hours prn and can try topical patches such as Salonpas or BioFreeze/Bengay.  Is taking Nexium 40 mg daily (that is Jeff's since they are awaiting prio-auth on her Nexium packets--she cannot swallow pills).  Also taking the carafate but complaining of it making her nauseated, but he is making her take it.  Could alternate with some Maalox/Mylanta for further relief if needed of GI symptoms.  Needs to see a primary care in the near future to find a regimen for her other pains.

## 2017-12-25 ENCOUNTER — Other Ambulatory Visit: Payer: Self-pay

## 2017-12-25 DIAGNOSIS — R1310 Dysphagia, unspecified: Secondary | ICD-10-CM

## 2017-12-25 DIAGNOSIS — R1319 Other dysphagia: Secondary | ICD-10-CM

## 2017-12-25 MED ORDER — ESOMEPRAZOLE MAGNESIUM 20 MG PO CPDR: DELAYED_RELEASE_CAPSULE | ORAL | 12 refills | Status: DC

## 2017-12-26 ENCOUNTER — Telehealth: Payer: Self-pay | Admitting: *Deleted

## 2017-12-26 ENCOUNTER — Encounter: Payer: Self-pay | Admitting: Gastroenterology

## 2017-12-26 NOTE — Telephone Encounter (Signed)
Attempted to reach pt to schedule follow up EGD per procedure report.  No answer.  Will attempt to reach later.

## 2017-12-27 NOTE — Telephone Encounter (Signed)
LMOM to call office to schedule repeat EGD.

## 2017-12-28 NOTE — Telephone Encounter (Signed)
Pt hasn't called back to reschedule EGD.  Recall put into computer so letter will be sent on 01-05-18 to set up a follow up EGD

## 2017-12-29 ENCOUNTER — Encounter: Payer: Self-pay | Admitting: Gastroenterology

## 2018-01-23 ENCOUNTER — Encounter: Payer: Self-pay | Admitting: Gastroenterology

## 2018-01-23 ENCOUNTER — Ambulatory Visit (AMBULATORY_SURGERY_CENTER): Payer: Self-pay

## 2018-01-23 VITALS — Ht 67.0 in | Wt 192.0 lb

## 2018-01-23 DIAGNOSIS — K2970 Gastritis, unspecified, without bleeding: Secondary | ICD-10-CM

## 2018-01-23 NOTE — Progress Notes (Signed)
Per pt, no allergies to soy or egg products.Pt not taking any weight loss meds or using  O2 at home.  Pt refused emmi video. 

## 2018-01-29 ENCOUNTER — Ambulatory Visit (AMBULATORY_SURGERY_CENTER): Payer: 59 | Admitting: Gastroenterology

## 2018-01-29 ENCOUNTER — Encounter: Payer: Self-pay | Admitting: Gastroenterology

## 2018-01-29 VITALS — BP 108/69 | HR 62 | Temp 98.2°F | Resp 19 | Ht 67.0 in | Wt 192.0 lb

## 2018-01-29 DIAGNOSIS — K2970 Gastritis, unspecified, without bleeding: Secondary | ICD-10-CM | POA: Diagnosis not present

## 2018-01-29 DIAGNOSIS — R1319 Other dysphagia: Secondary | ICD-10-CM

## 2018-01-29 DIAGNOSIS — R1310 Dysphagia, unspecified: Secondary | ICD-10-CM

## 2018-01-29 DIAGNOSIS — K269 Duodenal ulcer, unspecified as acute or chronic, without hemorrhage or perforation: Secondary | ICD-10-CM | POA: Diagnosis not present

## 2018-01-29 DIAGNOSIS — K449 Diaphragmatic hernia without obstruction or gangrene: Secondary | ICD-10-CM | POA: Diagnosis not present

## 2018-01-29 DIAGNOSIS — K2210 Ulcer of esophagus without bleeding: Secondary | ICD-10-CM

## 2018-01-29 MED ORDER — LANSOPRAZOLE 30 MG PO TBDD: 30.0000 mg | DELAYED_RELEASE_TABLET | Freq: Two times a day (BID) | ORAL | 3 refills | Status: DC

## 2018-01-29 MED ORDER — SODIUM CHLORIDE 0.9 % IV SOLN: 500.0000 mL | Freq: Once | INTRAVENOUS | Status: DC

## 2018-01-29 NOTE — Progress Notes (Signed)
Called to room to assist during endoscopic procedure.  Patient ID and intended procedure confirmed with present staff. Received instructions for my participation in the procedure from the performing physician.  

## 2018-01-29 NOTE — Op Note (Signed)
[facility] Patient Name: Angela Hartman Procedure Date: 01/29/2018 9:58 AM MRN: [id number] Endoscopist: Mauri Pole , MD Age: 37 Referring MD:  Date of Birth: [birth year] Gender: Female Account #: [id number] Procedure:                Upper GI endoscopy Indications:              Dysphagia, Epigastric abdominal pain, Follow-up of                            esophageal ulcer Medicines:                Monitored Anesthesia Care Procedure:                Pre-Anesthesia Assessment:                           - Prior to the procedure, a History and Physical                            was performed, and patient medications and                            allergies were reviewed. The patient's tolerance of                            previous anesthesia was also reviewed. The risks                            and benefits of the procedure and the sedation                            options and risks were discussed with the patient.                            All questions were answered, and informed consent                            was obtained. Prior Anticoagulants: The patient has                            taken no previous anticoagulant or antiplatelet                            agents. ASA Grade Assessment: II - A patient with                            mild systemic disease. After reviewing the risks                            and benefits, the patient was deemed in                            satisfactory condition to undergo the procedure.  After obtaining informed consent, the endoscope was                            passed under direct vision. Throughout the                            procedure, the patient's blood pressure, pulse, and                            oxygen saturations were monitored continuously. The                            Model GIF-HQ190 [id number]) scope was introduced                            through the mouth, and  advanced to the second part                            of duodenum. The upper GI endoscopy was                            accomplished without difficulty. The patient                            tolerated the procedure well. Scope In: Scope Out: Findings:                 LA Grade C (one or more mucosal breaks continuous                            between tops of 2 or more mucosal folds, less than                            75% circumference) esophagitis with no bleeding was                            found 33 to 38 cm from the incisors. Biopsies were                            taken with a cold forceps for histology.                           Few linear esophageal ulcers with no bleeding were                            found 34 to 38 cm from the incisors. The largest                            lesion was 5 mm in largest dimension. Biopsies were                            taken with a cold forceps for histology.  A 5 cm hiatal hernia was present. Biopsies were                            taken with a cold forceps for Helicobacter pylori                            testing.                           The exam of the stomach was otherwise normal.                           One cratered duodenal ulcer with a clean ulcer base                            (Forrest Class III) was found in the duodenal bulb.                            The lesion was 1 mm in largest dimension. Complications:            No immediate complications. Estimated Blood Loss:     Estimated blood loss was minimal. Impression:               - LA Grade C reflux esophagitis. Biopsied.                           - Non-bleeding esophageal ulcers. Biopsied.                           - 5 cm hiatal hernia. Biopsied.                           - One duodenal ulcer with a clean ulcer base                            (Forrest Class III). Recommendation:           - Resume previous diet.                           -  Continue present medications.                           - Use Nexium (esomeprazole) 40 mg PO BID for 3                            months.                           - Follow an antireflux regimen.                           - Return to my office in 1 month. Mauri Pole, MD 01/29/2018 10:34:07 AM This report has been signed electronically.

## 2018-01-29 NOTE — Progress Notes (Signed)
PT DID NOT WANT NEXIUM AS ORDERED ON REPORT . DR NANDIGAM WILL ORDER A SUBLINGUAL REFLUX MEDICATION FOR HER IN PLACE ON NEXIUM AS ORDERED .

## 2018-01-29 NOTE — Patient Instructions (Signed)
   INFORMATION ON GASTRITIS GIVEN TO YOU TODAY   INFORMATION ON ANTI REFLUX MEASURES GIVEN TO YOU TODA  AWAIT BIOPSY RESULTS  ORDER FOR REFLUX MEDICATION WILL BE ORDERED BY DR [place] TO YOUR PHARMACY   YOU HAD AN ENDOSCOPIC PROCEDURE TODAY AT [facility]:   Refer to the procedure report that was given to you for any specific questions about what was found during the examination.  If the procedure report does not answer your questions, please call your gastroenterologist to clarify.  If you requested that your care partner not be given the details of your procedure findings, then the procedure report has been included in a sealed envelope for you to review at your convenience later.  YOU SHOULD EXPECT: Some feelings of bloating in the abdomen. Passage of more gas than usual.  Walking can help get rid of the air that was put into your GI tract during the procedure and reduce the bloating. If you had a lower endoscopy (such as a colonoscopy or flexible sigmoidoscopy) you may notice spotting of blood in your stool or on the toilet paper. If you underwent a bowel prep for your procedure, you may not have a normal bowel movement for a few days.  Please Note:  You might notice some irritation and congestion in your nose or some drainage.  This is from the oxygen used during your procedure.  There is no need for concern and it should clear up in a day or so.  SYMPTOMS TO REPORT IMMEDIATELY:     Following upper endoscopy (EGD)  Vomiting of blood or coffee ground material  New chest pain or pain under the shoulder blades  Painful or persistently difficult swallowing  New shortness of breath  Fever of 100F or higher  Black, tarry-looking stools  For urgent or emergent issues, a gastroenterologist can be reached at any hour by calling [phone].   DIET:  We do recommend a small meal at first, but then you may proceed to your regular diet.  Drink plenty of fluids but you  should avoid alcoholic beverages for 24 hours.  ACTIVITY:  You should plan to take it easy for the rest of today and you should NOT DRIVE or use heavy machinery until tomorrow (because of the sedation medicines used during the test).    FOLLOW UP: Our staff will call the number listed on your records the next business day following your procedure to check on you and address any questions or concerns that you may have regarding the information given to you following your procedure. If we do not reach you, we will leave a message.  However, if you are feeling well and you are not experiencing any problems, there is no need to return our call.  We will assume that you have returned to your regular daily activities without incident.  If any biopsies were taken you will be contacted by phone or by letter within the next 1-3 weeks.  Please call us at [phone] if you have not heard about the biopsies in 3 weeks.    SIGNATURES/CONFIDENTIALITY: You and/or your care partner have signed paperwork which will be entered into your electronic medical record.  These signatures attest to the fact that that the information above on your After Visit Summary has been reviewed and is understood.  Full responsibility of the confidentiality of this discharge information lies with you and/or your care-partner.

## 2018-01-29 NOTE — Progress Notes (Signed)
PT taken to PACU. Monitors in place. VSS. Report given to RN. 

## 2018-01-30 ENCOUNTER — Telehealth: Payer: Self-pay

## 2018-01-30 NOTE — Telephone Encounter (Signed)
Second follow up phone call attempt, no answer, message left 

## 2018-01-30 NOTE — Telephone Encounter (Signed)
No answer on 1st call back attempt, unable to leave vm.

## 2018-02-01 ENCOUNTER — Telehealth: Payer: Self-pay

## 2018-02-01 NOTE — Telephone Encounter (Signed)
Post procedure recommendations list a 1 month follow up. Called the patient and left a voicemail.  Does she want to come for her follow up appointment with Dr Silverio Decamp 03/05/17 at 10:15 am? Requested a call back.

## 2018-02-05 ENCOUNTER — Encounter: Payer: Self-pay | Admitting: Gastroenterology

## 2018-02-09 ENCOUNTER — Telehealth: Payer: Self-pay | Admitting: *Deleted

## 2018-02-09 MED ORDER — LANSOPRAZOLE 30 MG PO TBDD: 30.0000 mg | DELAYED_RELEASE_TABLET | Freq: Every day | ORAL | 3 refills | Status: DC

## 2018-02-09 NOTE — Telephone Encounter (Signed)
Lansoprazole approved 30mg  is approved through 02/09/2019 for once daily but twice daily was denied   Dr Silverio Decamp once daily was approved do you just want me to send in rx for once daily ?

## 2018-02-09 NOTE — Telephone Encounter (Signed)
Sent prevacid Solutab 30 mg for once daily so it will be approved by the patients insurance  One month at twic a day was approved for temporary

## 2018-02-09 NOTE — Telephone Encounter (Signed)
Yes, please send Lasoprazole 30mg  once daily. Thanks

## 2018-02-28 NOTE — Telephone Encounter (Signed)
Unable to reach pt letter mailed 

## 2018-11-29 ENCOUNTER — Other Ambulatory Visit: Payer: Self-pay

## 2018-11-29 DIAGNOSIS — Z20822 Contact with and (suspected) exposure to covid-19: Secondary | ICD-10-CM

## 2018-12-01 LAB — NOVEL CORONAVIRUS, NAA: SARS-CoV-2, NAA: NOT DETECTED

## 2019-03-19 ENCOUNTER — Encounter: Payer: Self-pay | Admitting: Gastroenterology

## 2019-03-19 ENCOUNTER — Ambulatory Visit: Payer: 59 | Admitting: Gastroenterology

## 2019-03-19 VITALS — BP 90/60 | HR 83 | Temp 98.4°F | Ht 67.0 in | Wt 204.0 lb

## 2019-03-19 DIAGNOSIS — K2210 Ulcer of esophagus without bleeding: Secondary | ICD-10-CM

## 2019-03-19 DIAGNOSIS — Z01818 Encounter for other preprocedural examination: Secondary | ICD-10-CM

## 2019-03-19 DIAGNOSIS — R112 Nausea with vomiting, unspecified: Secondary | ICD-10-CM | POA: Diagnosis not present

## 2019-03-19 DIAGNOSIS — K2100 Gastro-esophageal reflux disease with esophagitis, without bleeding: Secondary | ICD-10-CM | POA: Diagnosis not present

## 2019-03-19 MED ORDER — DEXLANSOPRAZOLE 60 MG PO CPDR: 60.0000 mg | DELAYED_RELEASE_CAPSULE | Freq: Every day | ORAL | 3 refills | Status: DC

## 2019-03-19 MED ORDER — SUCRALFATE 1 G PO TABS: 1.0000 g | ORAL_TABLET | Freq: Three times a day (TID) | ORAL | 3 refills | Status: DC

## 2019-03-19 NOTE — Patient Instructions (Addendum)
If you are age 41 or older, your body mass index should be between 23-30. Your Body mass index is 31.95 kg/m. If this is out of the aforementioned range listed, please consider follow up with your Primary Care Provider.  If you are age 70 or younger, your body mass index should be between 19-25. Your Body mass index is 31.95 kg/m. If this is out of the aformentioned range listed, please consider follow up with your Primary Care Provider.   You have been scheduled for an endoscopy. Please follow written instructions given to you at your visit today. If you use inhalers (even only as needed), please bring them with you on the day of your procedure.  We have sent the following medications to your pharmacy for you to pick up at your convenience: Carafate 1g tablet: slowly dissolve tablet in 1 Tablespoon of distilled water before taking.  Take before meals and at bedtime as needed   Dexilant 60mg : Take daily (open capsule and take by spoon)   . Gastroesophageal Reflux Disease, Adult Gastroesophageal reflux (GER) happens when acid from the stomach flows up into the tube that connects the mouth and the stomach (esophagus). Normally, food travels down the esophagus and stays in the stomach to be digested. With GER, food and stomach acid sometimes move back up into the esophagus. You may have a disease called gastroesophageal reflux disease (GERD) if the reflux:  Happens often.  Causes frequent or very bad symptoms.  Causes problems such as damage to the esophagus. When this happens, the esophagus becomes sore and swollen (inflamed). Over time, GERD can make small holes (ulcers) in the lining of the esophagus. What are the causes? This condition is caused by a problem with the muscle between the esophagus and the stomach. When this muscle is weak or not normal, it does not close properly to keep food and acid from coming back up from the stomach. The muscle can be weak because of:  Tobacco  use.  Pregnancy.  Having a certain type of hernia (hiatal hernia).  Alcohol use.  Certain foods and drinks, such as coffee, chocolate, onions, and peppermint. What increases the risk? You are more likely to develop this condition if you:  Are overweight.  Have a disease that affects your connective tissue.  Use NSAID medicines. What are the signs or symptoms? Symptoms of this condition include:  Heartburn.  Difficult or painful swallowing.  The feeling of having a lump in the throat.  A bitter taste in the mouth.  Bad breath.  Having a lot of saliva.  Having an upset or bloated stomach.  Belching.  Chest pain. Different conditions can cause chest pain. Make sure you see your doctor if you have chest pain.  Shortness of breath or noisy breathing (wheezing).  Ongoing (chronic) cough or a cough at night.  Wearing away of the surface of teeth (tooth enamel).  Weight loss. How is this treated? Treatment will depend on how bad your symptoms are. Your doctor may suggest:  Changes to your diet.  Medicine.  Surgery. Follow these instructions at home: Eating and drinking   Follow a diet as told by your doctor. You may need to avoid foods and drinks such as: ? Coffee and tea (with or without caffeine). ? Drinks that contain alcohol. ? Energy drinks and sports drinks. ? Bubbly (carbonated) drinks or sodas. ? Chocolate and cocoa. ? Peppermint and mint flavorings. ? Garlic and onions. ? Horseradish. ? Spicy and acidic foods. These  include peppers, chili powder, curry powder, vinegar, hot sauces, and BBQ sauce. ? Citrus fruit juices and citrus fruits, such as oranges, lemons, and limes. ? Tomato-based foods. These include red sauce, chili, salsa, and pizza with red sauce. ? Fried and fatty foods. These include donuts, french fries, potato chips, and high-fat dressings. ? High-fat meats. These include hot dogs, rib eye steak, sausage, ham, and bacon. ? High-fat  dairy items, such as whole milk, butter, and cream cheese.  Eat small meals often. Avoid eating large meals.  Avoid drinking large amounts of liquid with your meals.  Avoid eating meals during the 2-3 hours before bedtime.  Avoid lying down right after you eat.  Do not exercise right after you eat. Lifestyle   Do not use any products that contain nicotine or tobacco. These include cigarettes, e-cigarettes, and chewing tobacco. If you need help quitting, ask your doctor.  Try to lower your stress. If you need help doing this, ask your doctor.  If you are overweight, lose an amount of weight that is healthy for you. Ask your doctor about a safe weight loss goal. General instructions  Pay attention to any changes in your symptoms.  Take over-the-counter and prescription medicines only as told by your doctor. Do not take aspirin, ibuprofen, or other NSAIDs unless your doctor says it is okay.  Wear loose clothes. Do not wear anything tight around your waist.  Raise (elevate) the head of your bed about 6 inches (15 cm).  Avoid bending over if this makes your symptoms worse.  Keep all follow-up visits as told by your doctor. This is important. Contact a doctor if:  You have new symptoms.  You lose weight and you do not know why.  You have trouble swallowing or it hurts to swallow.  You have wheezing or a cough that keeps happening.  Your symptoms do not get better with treatment.  You have a hoarse voice. Get help right away if:  You have pain in your arms, neck, jaw, teeth, or back.  You feel sweaty, dizzy, or light-headed.  You have chest pain or shortness of breath.  You throw up (vomit) and your throw-up looks like blood or coffee grounds.  You pass out (faint).  Your poop (stool) is bloody or black.  You cannot swallow, drink, or eat. Summary  If a person has gastroesophageal reflux disease (GERD), food and stomach acid move back up into the esophagus and  cause symptoms or problems such as damage to the esophagus.  Treatment will depend on how bad your symptoms are.  Follow a diet as told by your doctor.  Take all medicines only as told by your doctor. This information is not intended to replace advice given to you by your health care provider. Make sure you discuss any questions you have with your health care provider. Document Revised: 08/16/2017 Document Reviewed: 08/16/2017 Elsevier Patient Education  [employer].  Please follow up in 3 months.  We will send you a reminder when it is time to schedule.  Thank you,  Dr. Silverio Decamp

## 2019-03-19 NOTE — Progress Notes (Signed)
Angela Hartman    [id number]    03-Apr-1980  Primary Care Physician:System, Pcp Not In  Referring Physician: No referring provider defined for this encounter.   Chief complaint:  GERD  HPI:  39 year old female with history of chronic GERD with severe erosive esophagitis and esophageal ulcers here for follow-up with with complaints of worsening heartburn and reflux symptoms. She is taking omeprazole 20 mg tablets dissolvable, 2-3 times a day with no significant improvement. She has phobia for swallowing tablets or capsules and is unable to take them.  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  Hiatal hernia.  Cratered duodenal ulcer in duodenal bulb  EGD December 21, 2017.  LA grade B severe erosive esophagitis, hiatal hernia, gastritis and duodenitis.  Outpatient Encounter Medications as of 03/19/2019  Medication Sig  . omeprazole (PRILOSEC) 20 MG capsule Take 20 mg by mouth. 2-3 tablets daily  . [DISCONTINUED] calcium carbonate (TUMS - DOSED IN MG ELEMENTAL CALCIUM) 500 MG chewable tablet Chew 1 tablet by mouth as needed for indigestion or heartburn.  . [DISCONTINUED] esomeprazole (NEXIUM) 20 MG capsule Open 1 capsule into applesauce and swallow by mouth twice daily (Patient not taking: Reported on 01/23/2018)  . [DISCONTINUED] lansoprazole (PREVACID SOLUTAB) 30 MG disintegrating tablet Take 1 tablet (30 mg total) by mouth daily at 12 noon.  . [DISCONTINUED] sucralfate (CARAFATE) 1 g tablet Take 1 tablet (1 g total) by mouth 4 (four) times daily. (Patient taking differently: Take 1 g by mouth 4 (four) times daily. Carafate suspension-Take prn)   No facility-administered encounter medications on file as of 03/19/2019.     Allergies as of 03/19/2019 - Review Complete 03/19/2019  Allergen Reaction Noted  . Penicillins Hives 06/19/2012  . Other  04/12/2016    Past Medical History:  Diagnosis Date  . Abnormal Pap smear of cervix    has had several  . Anemia   . Anxiety   . Chest pain 2018   Went to ER, EKG normal, due to anxiety per patient  . GERD (gastroesophageal reflux disease)   . Headache    migraines  . Nephrolithiasis    patient denies     Past Surgical History:  Procedure Laterality Date  . CHOLECYSTECTOMY    . CYSTOCELE REPAIR N/A 05/06/2016   Procedure: Repair Vaginal Cuff Dehisance,;  Surgeon: Bobbye Charleston, MD;  Location: [facility];  Service: Gynecology;  Laterality: N/A;  . CYSTOSCOPY N/A 04/20/2016   Procedure: CYSTOSCOPY;  Surgeon: Bobbye Charleston, MD;  Location: [facility];  Service: Gynecology;  Laterality: N/A;  . CYSTOSCOPY  05/06/2016   Procedure: CYSTOSCOPY;  Surgeon: Bobbye Charleston, MD;  Location: [facility];  Service: Gynecology;;  . ROBOTIC ASSISTED TOTAL HYSTERECTOMY WITH BILATERAL SALPINGO OOPHERECTOMY N/A 04/20/2016   Procedure: ROBOTIC ASSISTED TOTAL HYSTERECTOMY WITH BILATERAL SALPINGO OOPHORECTOMY;  Surgeon: Bobbye Charleston, MD;  Location: [facility];  Service: Gynecology;  Laterality: N/A;  . UPPER GASTROINTESTINAL ENDOSCOPY    . WISDOM TOOTH EXTRACTION      Family History  Problem Relation Age of Onset  . Heart attack Father   . Esophageal cancer Neg Hx   . Colon cancer Neg Hx   . Rectal cancer Neg  Hx   . Stomach cancer Neg Hx     Social History   Socioeconomic History  . Marital status: Legally Separated    Spouse name: Not on file  . Number of children: 2  . Years of education: Not on file  . Highest education level: Not on file  Occupational History  . 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 Smoking status: Current Every Day Smoker    Packs/day: 1.00    Years: 8.00    Pack years: 8.00    Types: Cigarettes  . Smokeless tobacco: Never Used   Substance and Sexual Activity  . Alcohol use: No  . Drug use: No  . Sexual activity: Not on file    Comment: surgical - hysterectomy  Other Topics Concern  . Not on file  Social History Narrative  . Not on file   Social Determinants of Health   Financial Resource Strain:   . Difficulty of Paying Living Expenses: Not on file  Food Insecurity:   . Worried About Charity fundraiser in the Last Year: Not on file  . Ran Out of Food in the Last Year: Not on file  Transportation Needs:   . Lack of Transportation (Medical): Not on file  . Lack of Transportation (Non-Medical): Not on file  Physical Activity:   . Days of Exercise per Week: Not on file  . Minutes of Exercise per Session: Not on file  Stress:   . Feeling of Stress : Not on file  Social Connections:   . Frequency of Communication with Friends and Family: Not on file  . Frequency of Social Gatherings with Friends and Family: Not on file  . Attends Religious Services: Not on file  . Active Member of Clubs or Organizations: Not on file  . Attends Archivist Meetings: Not on file  . Marital Status: Not on file  Intimate Partner Violence:   . Fear of Current or Ex-Partner: Not on file  . Emotionally Abused: Not on file  . Physically Abused: Not on file  . Sexually Abused: Not on file      Review of systems: All other review of systems negative except as mentioned in the HPI.   Physical Exam: Vitals:   03/19/19 1311  BP: 90/60  Pulse: 83  Temp: 98.4 F (36.9 C)   Body mass index is 31.95 kg/m. Gen:      No acute distress HEENT:  EOMI, sclera anicteric Neck:     No masses; no thyromegaly Lungs:    Clear to auscultation bilaterally; normal respiratory effort CV:         Regular rate and rhythm; no murmurs Abd:      + bowel sounds; soft, non-tender; no palpable masses, no distension Ext:    No edema; adequate peripheral perfusion Skin:      Warm and dry; no rash Neuro: alert and oriented x  3 Psych: normal mood and affect  Data Reviewed:  Reviewed labs, radiology imaging, old records and pertinent past GI work up   Assessment and Plan/Recommendations:  39 year old female with history of chronic GERD, severe erosive esophagitis with esophageal ulcers with complaints of worsening heartburn and reflux symptoms  Will schedule for EGD to evaluate, obtain esophageal biopsies to exclude dysplasia, Barrett's esophagus.  She has severe pill phobia, unable to swallow any tablets or capsules Lansoprazole Solutab or Nexium packet, dissolvable is not covered by her insurance  We will send prescription for Dexilant 60 mg daily,  advised patient to open the capsule and swallow the granules  Dissolve Carafate tablet and 2 ounce liquid and drink the suspension before meals and bedtime as needed  Return in 3 months or sooner if needed  The risks and benefits as well as alternatives of endoscopic procedure(s) have been discussed and reviewed. All questions answered. The patient agrees to proceed.   The patient was provided an opportunity to ask questions and all were answered. The patient agreed with the plan and demonstrated an understanding of the instructions.  Damaris Hippo , MD    CC: No ref. provider found

## 2019-03-21 ENCOUNTER — Encounter: Payer: Self-pay | Admitting: Gastroenterology

## 2019-04-02 ENCOUNTER — Encounter: Payer: Self-pay | Admitting: Gastroenterology

## 2019-04-02 ENCOUNTER — Other Ambulatory Visit: Payer: Self-pay

## 2019-04-02 ENCOUNTER — Ambulatory Visit (INDEPENDENT_AMBULATORY_CARE_PROVIDER_SITE_OTHER): Payer: 59

## 2019-04-02 ENCOUNTER — Other Ambulatory Visit: Payer: Self-pay | Admitting: Gastroenterology

## 2019-04-02 DIAGNOSIS — Z1159 Encounter for screening for other viral diseases: Secondary | ICD-10-CM

## 2019-04-03 LAB — SARS CORONAVIRUS 2 (TAT 6-24 HRS): SARS Coronavirus 2: NEGATIVE

## 2019-04-04 ENCOUNTER — Encounter: Payer: Self-pay | Admitting: Gastroenterology

## 2019-04-04 ENCOUNTER — Ambulatory Visit (AMBULATORY_SURGERY_CENTER): Payer: 59 | Admitting: Gastroenterology

## 2019-04-04 ENCOUNTER — Telehealth: Payer: Self-pay

## 2019-04-04 ENCOUNTER — Other Ambulatory Visit: Payer: Self-pay

## 2019-04-04 VITALS — BP 101/66 | HR 58 | Temp 97.3°F | Resp 14 | Ht 67.0 in | Wt 204.0 lb

## 2019-04-04 DIAGNOSIS — K2100 Gastro-esophageal reflux disease with esophagitis, without bleeding: Secondary | ICD-10-CM

## 2019-04-04 DIAGNOSIS — K449 Diaphragmatic hernia without obstruction or gangrene: Secondary | ICD-10-CM

## 2019-04-04 DIAGNOSIS — K31819 Angiodysplasia of stomach and duodenum without bleeding: Secondary | ICD-10-CM | POA: Diagnosis not present

## 2019-04-04 MED ORDER — SODIUM CHLORIDE 0.9 % IV SOLN: 500.0000 mL | Freq: Once | INTRAVENOUS | Status: DC

## 2019-04-04 NOTE — Op Note (Signed)
[facility] Patient Name: Angela Hartman Procedure Date: 04/04/2019 11:02 AM MRN: [id number] Endoscopist: Mauri Pole , MD Age: 39 Referring MD:  Date of Birth: 07-05-1980 Gender: Female Account #: [id number] Procedure:                Upper GI endoscopy Indications:              Follow-up of reflux esophagitis, Esophageal reflux                            symptoms that persist despite appropriate therapy Procedure:                Pre-Anesthesia Assessment:                           - Prior to the procedure, a History and Physical                            was performed, and patient medications and                            allergies were reviewed. The patient's tolerance of                            previous anesthesia was also reviewed. The risks                            and benefits of the procedure and the sedation                            options and risks were discussed with the patient.                            All questions were answered, and informed consent                            was obtained. Prior Anticoagulants: The patient has                            taken no previous anticoagulant or antiplatelet                            agents. ASA Grade Assessment: II - A patient with                            mild systemic disease. After reviewing the risks                            and benefits, the patient was deemed in                            satisfactory condition to undergo the procedure.                           After obtaining informed consent, the endoscope  was                            passed under direct vision. Throughout the                            procedure, the patient's blood pressure, pulse, and                            oxygen saturations were monitored continuously. The                            Endoscope was introduced through the mouth, and                            advanced to the second part of duodenum. The upper                             GI endoscopy was accomplished without difficulty.                            The patient tolerated the procedure well. Scope In: 11:07:39 AM Scope Out: 11:10:35 AM Total Procedure Duration: 0 hours 2 minutes 56 seconds  Findings:                 LA Grade C (one or more mucosal breaks continuous                            between tops of 2 or more mucosal folds, less than                            75% circumference) esophagitis with no bleeding was                            found 33 to 35 cm from the incisors.                           A large hiatal hernia ~6cm was present.                           A single less than 5 mm angioectasia with no                            bleeding was found in the gastric body.                           The stomach was otherwise normal.                           The examined duodenum was normal. Complications:            No immediate complications. Estimated Blood Loss:     Estimated blood loss was minimal. Impression:               - LA Grade C reflux esophagitis  with no bleeding.                           - Large hiatal hernia.                           - A single non-bleeding angioectasia in the stomach.                           - Normal stomach otherwise.                           - Normal examined duodenum.                           - No specimens collected. Recommendation:           - Patient has a contact number available for                            emergencies. The signs and symptoms of potential                            delayed complications were discussed with the                            patient. Return to normal activities tomorrow.                            Written discharge instructions were provided to the                            patient.                           - Resume previous diet.                           - Continue present medications.                           - Return to GI office at the  next available                            appointment.                           - Follow an antireflux regimen. Mauri Pole, MD 04/04/2019 11:16:49 AM This report has been signed electronically.

## 2019-04-04 NOTE — Progress Notes (Signed)
Temp check by:LC Vital check by:DT  The medical and surgical history was reviewed and verified with the patient. 

## 2019-04-04 NOTE — Telephone Encounter (Signed)
Called pt per Dr. Silverio Decamp to see if pt could move up to 9:30 am appt for her EDG procedure this morning.  No answer.  Left message.

## 2019-04-04 NOTE — Progress Notes (Signed)
PT taken to PACU. Monitors in place. VSS. Report given to RN. 

## 2019-04-04 NOTE — Patient Instructions (Signed)
Information on hiatal hernia given to you today.  YOU HAD AN ENDOSCOPIC PROCEDURE TODAY AT [facility]:   Refer to the procedure report that was given to you for any specific questions about what was found during the examination.  If the procedure report does not answer your questions, please call your gastroenterologist to clarify.  If you requested that your care partner not be given the details of your procedure findings, then the procedure report has been included in a sealed envelope for you to review at your convenience later.  YOU SHOULD EXPECT: Some feelings of bloating in the abdomen. Passage of more gas than usual.  Walking can help get rid of the air that was put into your GI tract during the procedure and reduce the bloating. If you had a lower endoscopy (such as a colonoscopy or flexible sigmoidoscopy) you may notice spotting of blood in your stool or on the toilet paper. If you underwent a bowel prep for your procedure, you may not have a normal bowel movement for a few days.  Please Note:  You might notice some irritation and congestion in your nose or some drainage.  This is from the oxygen used during your procedure.  There is no need for concern and it should clear up in a day or so.  SYMPTOMS TO REPORT IMMEDIATELY:    Following upper endoscopy (EGD)  Vomiting of blood or coffee ground material  New chest pain or pain under the shoulder blades  Painful or persistently difficult swallowing  New shortness of breath  Fever of 100F or higher  Black, tarry-looking stools  For urgent or emergent issues, a gastroenterologist can be reached at any hour by calling [phone].   DIET:  We do recommend a small meal at first, but then you may proceed to your regular diet.  Drink plenty of fluids but you should avoid alcoholic beverages for 24 hours.  ACTIVITY:  You should plan to take it easy for the rest of today and you should NOT DRIVE or use heavy machinery  until tomorrow (because of the sedation medicines used during the test).    FOLLOW UP: Our staff will call the number listed on your records 48-72 hours following your procedure to check on you and address any questions or concerns that you may have regarding the information given to you following your procedure. If we do not reach you, we will leave a message.  We will attempt to reach you two times.  During this call, we will ask if you have developed any symptoms of COVID 19. If you develop any symptoms (ie: fever, flu-like symptoms, shortness of breath, cough etc.) before then, please call [phone].  If you test positive for Covid 19 in the 2 weeks post procedure, please call and report this information to [place].    If any biopsies were taken you will be contacted by phone or by letter within the next 1-3 weeks.  Please call us at [phone] if you have not heard about the biopsies in 3 weeks.    SIGNATURES/CONFIDENTIALITY: You and/or your care partner have signed paperwork which will be entered into your electronic medical record.  These signatures attest to the fact that that the information above on your After Visit Summary has been reviewed and is understood.  Full responsibility of the confidentiality of this discharge information lies with you and/or your care-partner.

## 2019-04-08 ENCOUNTER — Telehealth: Payer: Self-pay | Admitting: *Deleted

## 2019-04-08 NOTE — Telephone Encounter (Signed)
  Follow up Call-  Call back number 04/04/2019 01/29/2018 12/21/2017  Post procedure Call Back phone  # [phone] cell  Permission to leave phone message Yes Yes Yes  Some recent data might be hidden     Patient questions:  Do you have a fever, pain , or abdominal swelling? No. Pain Score  0 *  Have you tolerated food without any problems? Yes.    Have you been able to return to your normal activities? Yes.    Do you have any questions about your discharge instructions: Diet   No. Medications  No. Follow up visit  No.  Do you have questions or concerns about your Care? No.  Actions: * If pain score is 4 or above: No action needed, pain <4.  1. Have you developed a fever since your procedure? no  2.   Have you had an respiratory symptoms (SOB or cough) since your procedure? no  3.   Have you tested positive for COVID 19 since your procedure no  4.   Have you had any family members/close contacts diagnosed with the COVID 19 since your procedure?  no   If yes to any of these questions please route to Joylene John, RN and Alphonsa Gin, Therapist, sports.

## 2019-06-17 ENCOUNTER — Ambulatory Visit: Payer: 59 | Admitting: Family

## 2019-06-17 ENCOUNTER — Other Ambulatory Visit: Payer: Self-pay

## 2019-06-17 ENCOUNTER — Other Ambulatory Visit: Payer: Self-pay | Admitting: Family

## 2019-06-17 ENCOUNTER — Encounter: Payer: Self-pay | Admitting: Family

## 2019-06-17 VITALS — BP 102/80 | HR 80 | Temp 98.6°F | Ht 67.0 in | Wt 209.8 lb

## 2019-06-17 DIAGNOSIS — R5383 Other fatigue: Secondary | ICD-10-CM

## 2019-06-17 DIAGNOSIS — F419 Anxiety disorder, unspecified: Secondary | ICD-10-CM

## 2019-06-17 DIAGNOSIS — R635 Abnormal weight gain: Secondary | ICD-10-CM | POA: Diagnosis not present

## 2019-06-17 DIAGNOSIS — Z13220 Encounter for screening for lipoid disorders: Secondary | ICD-10-CM

## 2019-06-17 DIAGNOSIS — E669 Obesity, unspecified: Secondary | ICD-10-CM | POA: Diagnosis not present

## 2019-06-17 LAB — COMPREHENSIVE METABOLIC PANEL
ALT: 34 U/L (ref 0–35)
AST: 27 U/L (ref 0–37)
Albumin: 3.9 g/dL (ref 3.5–5.2)
Alkaline Phosphatase: 74 U/L (ref 39–117)
BUN: 14 mg/dL (ref 6–23)
CO2: 23 mEq/L (ref 19–32)
Calcium: 9 mg/dL (ref 8.4–10.5)
Chloride: 107 mEq/L (ref 96–112)
Creatinine, Ser: 0.73 mg/dL (ref 0.40–1.20)
GFR: 87.96 mL/min (ref >60.00)
Glucose, Bld: 90 mg/dL (ref 70–99)
Potassium: 3.6 mEq/L (ref 3.5–5.1)
Sodium: 138 mEq/L (ref 135–145)
Total Bilirubin: 0.4 mg/dL (ref 0.2–1.2)
Total Protein: 6.3 g/dL (ref 6.0–8.3)

## 2019-06-17 LAB — CBC WITH DIFFERENTIAL/PLATELET
Basophils Absolute: 0.1 10*3/uL (ref 0.0–0.1)
Basophils Relative: 0.8 % (ref 0.0–3.0)
Eosinophils Absolute: 0.2 10*3/uL (ref 0.0–0.7)
Eosinophils Relative: 1.9 % (ref 0.0–5.0)
HCT: 42 % (ref 36.0–46.0)
Hemoglobin: 14.2 g/dL (ref 12.0–15.0)
Lymphocytes Relative: 28.7 % (ref 12.0–46.0)
Lymphs Abs: 2.4 10*3/uL (ref 0.7–4.0)
MCHC: 33.7 g/dL (ref 30.0–36.0)
MCV: 90.3 fl (ref 78.0–100.0)
Monocytes Absolute: 0.6 10*3/uL (ref 0.1–1.0)
Monocytes Relative: 6.7 % (ref 3.0–12.0)
Neutro Abs: 5.3 10*3/uL (ref 1.4–7.7)
Neutrophils Relative %: 61.9 % (ref 43.0–77.0)
Platelets: 289 10*3/uL (ref 150.0–400.0)
RBC: 4.66 Mil/uL (ref 3.87–5.11)
RDW: 13.5 % (ref 11.5–15.5)
WBC: 8.5 10*3/uL (ref 4.0–10.5)

## 2019-06-17 LAB — LIPID PANEL
Cholesterol: 188 mg/dL (ref 0–200)
HDL: 48.7 mg/dL (ref >39.00)
LDL Cholesterol: 120 mg/dL — ABNORMAL HIGH (ref 0–99)
NonHDL: 138.81
Total CHOL/HDL Ratio: 4
Triglycerides: 96 mg/dL (ref 0.0–149.0)
VLDL: 19.2 mg/dL (ref 0.0–40.0)

## 2019-06-17 LAB — VITAMIN D 25 HYDROXY (VIT D DEFICIENCY, FRACTURES): VITD: 23.46 ng/mL — ABNORMAL LOW (ref 30.00–100.00)

## 2019-06-17 LAB — HEMOGLOBIN A1C: Hgb A1c MFr Bld: 5.6 % (ref 4.6–6.5)

## 2019-06-17 LAB — TSH: TSH: 4.7 u[IU]/mL — ABNORMAL HIGH (ref 0.35–4.50)

## 2019-06-17 MED ORDER — FLUOXETINE HCL 20 MG PO CAPS
20.0000 mg | ORAL_CAPSULE | Freq: Every day | ORAL | 0 refills | Status: DC
Start: 2019-06-17 — End: 2019-08-28

## 2019-06-17 MED ORDER — VITAMIN D (ERGOCALCIFEROL) 1.25 MG (50000 UNIT) PO CAPS: 50000.0000 [IU] | ORAL_CAPSULE | ORAL | 0 refills | Status: AC

## 2019-06-17 NOTE — Progress Notes (Signed)
Angela Hartman is a 39 y.o. female with the following history as recorded in EpicCare:  Patient Active Problem List   Diagnosis Date Noted  . Postoperative state 04/20/2016  . Chest pain 12/09/2013  . Smoking 12/09/2013    Current Outpatient Medications  Medication Sig Dispense Refill  . dexlansoprazole (DEXILANT) 60 MG capsule Take 1 capsule (60 mg total) by mouth daily. Open capsule and take by spoon 30 capsule 3  . FLUoxetine (PROZAC) 20 MG capsule Take 1 capsule (20 mg total) by mouth daily. 90 capsule 0   No current facility-administered medications for this visit.    Allergies: Penicillins and Other  Past Medical History:  Diagnosis Date  . Abnormal Pap smear of cervix    has had several  . Anemia   . Anxiety   . Chest pain 2018   Went to ER, EKG normal, due to anxiety per patient  . GERD (gastroesophageal reflux disease)   . Headache    migraines    Past Surgical History:  Procedure Laterality Date  . CHOLECYSTECTOMY    . CYSTOCELE REPAIR N/A 05/06/2016   Procedure: Repair Vaginal Cuff Dehisance,;  Surgeon: Bobbye Charleston, MD;  Location: [facility];  Service: Gynecology;  Laterality: N/A;  . CYSTOSCOPY N/A 04/20/2016   Procedure: CYSTOSCOPY;  Surgeon: Bobbye Charleston, MD;  Location: [facility];  Service: Gynecology;  Laterality: N/A;  . CYSTOSCOPY  05/06/2016   Procedure: CYSTOSCOPY;  Surgeon: Bobbye Charleston, MD;  Location: [facility];  Service: Gynecology;;  . ROBOTIC ASSISTED TOTAL HYSTERECTOMY WITH BILATERAL SALPINGO OOPHERECTOMY N/A 04/20/2016   Procedure: ROBOTIC ASSISTED TOTAL HYSTERECTOMY WITH BILATERAL SALPINGO OOPHORECTOMY;  Surgeon: Bobbye Charleston, MD;  Location: [facility];  Service: Gynecology;  Laterality: N/A;  . UPPER GASTROINTESTINAL ENDOSCOPY    . WISDOM TOOTH EXTRACTION      Family History  Problem Relation Age of Onset  . Heart attack Father   . Esophageal cancer Neg Hx   . Colon cancer Neg Hx   . Rectal cancer Neg Hx   . Stomach cancer Neg Hx      Social History   Tobacco Use  . Smoking status: Current Every Day Smoker    Packs/day: 1.00    Years: 8.00    Pack years: 8.00    Types: Cigarettes  . Smokeless tobacco: Never Used  Substance Use Topics  . Alcohol use: No    Subjective:  Presents today as a new patient; is worried about her weight- admits she is stress eating/ works 3rd shift- admits not sleeping well; drinks soft drinks daily- "that's pretty much all I drink." Would like to be referred to weight loss clinic;  Has history of anxiety- would like to re-start medication; needs a capsule or liquid formulation; has taken medication in the past but does not remember then name;    Objective:  Vitals:   06/17/19 1022  BP: 102/80  Pulse: 80  Temp: 98.6 F (37 C)  TempSrc: Oral  SpO2: 96%  Weight: 209 lb 12.8 oz (95.2 kg)  Height: _0  (1.702 m)    General: Well developed, well nourished, in no acute distress  Skin : Warm and dry.  Head: Normocephalic and atraumatic  Eyes: Sclera and conjunctiva clear; pupils round and reactive to light; extraocular movements intact  Ears: External normal; canals clear; tympanic membranes normal  Oropharynx: Pink, supple. No suspicious lesions  Neck: Supple without thyromegaly, adenopathy  Lungs: Respirations unlabored; clear to auscultation bilaterally without wheeze, rales, rhonchi  CVS  exam: normal rate and regular rhythm.  Musculoskeletal: No deformities; no active joint inflammation  Extremities: No edema, cyanosis, clubbing  Vessels: Symmetric bilaterally  Neurologic: Alert and oriented; speech intact; face symmetrical; moves all extremities well; CNII-XII intact without focal deficit    Assessment:  1. Obesity (BMI 30.0-34.9)   2. Other fatigue   3. Lipid screening   4. Weight gain   5. Anxiety     Plan:  Will update labs today; encouraged to work on limiting intake of sodas and continue exercise goals; will give trial of Prozac- this is capsule option/ she has  taken liquid Citalopram in the past but does not want to take liquid again; risks/ benefits of medication discussed- follow up in 2 months to discuss response; Refer to weight loss clinic as well;   This visit occurred during the SARS-CoV-2 public health emergency.  Safety protocols were in place, including screening questions prior to the visit, additional usage of staff PPE, and extensive cleaning of exam room while observing appropriate contact time as indicated for disinfecting solutions.    Return in about 2 months (around 08/17/2019).  Orders Placed This Encounter  Procedures  . CBC with Differential/Platelet  . Comp Met (CMET)  . Lipid panel  . TSH  . Vitamin D (25 hydroxy)  . HgB A1c  . Amb Ref to Medical Weight Management    Referral Priority:   Routine    Referral Type:   Consultation    Number of Visits Requested:   1    Requested Prescriptions   Signed Prescriptions Disp Refills  . FLUoxetine (PROZAC) 20 MG capsule 90 capsule 0    Sig: Take 1 capsule (20 mg total) by mouth daily.

## 2019-07-12 ENCOUNTER — Encounter: Payer: Self-pay | Admitting: Gastroenterology

## 2019-08-05 ENCOUNTER — Ambulatory Visit (INDEPENDENT_AMBULATORY_CARE_PROVIDER_SITE_OTHER): Payer: Self-pay | Admitting: Bariatrics

## 2019-08-19 ENCOUNTER — Ambulatory Visit: Payer: 59 | Admitting: Family

## 2019-08-19 ENCOUNTER — Ambulatory Visit (INDEPENDENT_AMBULATORY_CARE_PROVIDER_SITE_OTHER): Payer: Self-pay | Admitting: Bariatrics

## 2019-08-27 ENCOUNTER — Other Ambulatory Visit: Payer: Self-pay

## 2019-08-27 ENCOUNTER — Encounter: Payer: Self-pay | Admitting: Internal Medicine

## 2019-08-27 ENCOUNTER — Ambulatory Visit: Payer: 59 | Admitting: Internal Medicine

## 2019-08-27 ENCOUNTER — Ambulatory Visit: Payer: 59 | Admitting: Family

## 2019-08-27 VITALS — BP 122/76 | HR 86 | Temp 98.8°F | Ht 67.0 in | Wt 201.0 lb

## 2019-08-27 DIAGNOSIS — R7989 Other specified abnormal findings of blood chemistry: Secondary | ICD-10-CM | POA: Diagnosis not present

## 2019-08-27 DIAGNOSIS — R233 Spontaneous ecchymoses: Secondary | ICD-10-CM

## 2019-08-27 DIAGNOSIS — R238 Other skin changes: Secondary | ICD-10-CM | POA: Diagnosis not present

## 2019-08-27 DIAGNOSIS — R739 Hyperglycemia, unspecified: Secondary | ICD-10-CM

## 2019-08-27 DIAGNOSIS — F419 Anxiety disorder, unspecified: Secondary | ICD-10-CM

## 2019-08-27 LAB — CBC
HCT: 44.4 % (ref 36.0–46.0)
Hemoglobin: 15.1 g/dL — ABNORMAL HIGH (ref 12.0–15.0)
MCHC: 34.1 g/dL (ref 30.0–36.0)
MCV: 90.5 fl (ref 78.0–100.0)
Platelets: 282 10*3/uL (ref 150.0–400.0)
RBC: 4.91 Mil/uL (ref 3.87–5.11)
RDW: 13.2 % (ref 11.5–15.5)
WBC: 7.4 10*3/uL (ref 4.0–10.5)

## 2019-08-27 LAB — T4, FREE: Free T4: 0.97 ng/dL (ref 0.60–1.60)

## 2019-08-27 LAB — HEMOGLOBIN A1C: Hgb A1c MFr Bld: 5.6 % (ref 4.6–6.5)

## 2019-08-27 LAB — TSH: TSH: 4.41 u[IU]/mL (ref 0.35–4.50)

## 2019-08-27 MED ORDER — DOXYCYCLINE CALCIUM 50 MG/5ML PO SYRP: 200.0000 mg | ORAL_SOLUTION | Freq: Once | ORAL | 0 refills | Status: AC

## 2019-08-27 MED ORDER — ESCITALOPRAM OXALATE 10 MG PO TABS: 10.0000 mg | ORAL_TABLET | Freq: Every day | ORAL | 3 refills | Status: DC

## 2019-08-27 NOTE — Patient Instructions (Signed)
We have sent in the liquid doxycycline to take 20 mL one time to cover for tick illnesses.  We have sent in lexapro (escitalopram) to take 1 daily. You can crush this and put with applesauce or other to swallow.  We will check the labs today and call you back about the results.

## 2019-08-27 NOTE — Progress Notes (Signed)
   Subjective:   Patient ID: Angela Hartman, female    DOB: [birth year], 39 y.o.   MRN: [id number]  HPI The patient is a 39 YO female coming in for leg bruising (there is a sore spot and bruising on her leg, she does not recall doing anything to it, also removed a tick from her lower stomach recently, now is aching, she is concerned about tick bourne illness, often outside and is not sure how long the tick was attached, some achiness and vague chills) and depression (was given rx for prozac about 2 months ago and tried to open the capsule but the powder was too thick and she was not able to do that, she did not let pcp know, is still dealing with anxiety and not sleeping well, feels tired, brother recently committed suicide in June and she is still not coping well with this) and concerns about sugars (wants recheck today as she is worried about that, diet is about the same, under a lot of stress recently) and thyroid (states levels were high and she needed recheck, trying to lose weight and unable, was referred to medical weight management but brother committed suicide right before she was supposed to go and so could not go).   Review of Systems  Constitutional: Positive for activity change and chills.  HENT: Negative.   Eyes: Negative.   Respiratory: Negative for cough, chest tightness and shortness of breath.   Cardiovascular: Negative for chest pain, palpitations and leg swelling.  Gastrointestinal: Negative for abdominal distention, abdominal pain, constipation, diarrhea, nausea and vomiting.  Musculoskeletal: Positive for myalgias.  Skin: Negative.   Neurological: Negative.   Hematological: Bruises/bleeds easily.  Psychiatric/Behavioral: Positive for decreased concentration and sleep disturbance. Negative for self-injury and suicidal ideas. The patient is nervous/anxious.     Objective:  Physical Exam Constitutional:      Appearance: She is well-developed.  HENT:     Head:  Normocephalic and atraumatic.  Cardiovascular:     Rate and Rhythm: Normal rate and regular rhythm.  Pulmonary:     Effort: Pulmonary effort is normal. No respiratory distress.     Breath sounds: Normal breath sounds. No wheezing or rales.  Abdominal:     General: Bowel sounds are normal. There is no distension.     Palpations: Abdomen is soft.     Tenderness: There is no abdominal tenderness. There is no rebound.  Musculoskeletal:     Cervical back: Normal range of motion.  Skin:    General: Skin is warm and dry.     Comments: There is a bruise on the upper left leg with some soreness to palpation  Neurological:     Mental Status: She is alert and oriented to person, place, and time.     Coordination: Coordination normal.     Vitals:   08/27/19 1306  BP: 122/76  Pulse: 86  Temp: 98.8 F (37.1 C)  TempSrc: Oral  SpO2: 97%  Weight: 201 lb (91.2 kg)  Height: 5\' 7"  (1.702 m)    This visit occurred during the SARS-CoV-2 public health emergency.  Safety protocols were in place, including screening questions prior to the visit, additional usage of staff PPE, and extensive cleaning of exam room while observing appropriate contact time as indicated for disinfecting solutions.   Assessment & Plan:

## 2019-08-28 DIAGNOSIS — R233 Spontaneous ecchymoses: Secondary | ICD-10-CM | POA: Insufficient documentation

## 2019-08-28 DIAGNOSIS — R739 Hyperglycemia, unspecified: Secondary | ICD-10-CM | POA: Insufficient documentation

## 2019-08-28 DIAGNOSIS — R7989 Other specified abnormal findings of blood chemistry: Secondary | ICD-10-CM | POA: Insufficient documentation

## 2019-08-28 DIAGNOSIS — F419 Anxiety disorder, unspecified: Secondary | ICD-10-CM | POA: Insufficient documentation

## 2019-08-28 NOTE — Assessment & Plan Note (Signed)
Checking HgA1c. 

## 2019-08-28 NOTE — Assessment & Plan Note (Signed)
Checking TSH and free T4.

## 2019-08-28 NOTE — Assessment & Plan Note (Signed)
Checking CBC, rx doxycycline liquid to take 200 mg once to prevent tick bourne illness.

## 2019-08-28 NOTE — Assessment & Plan Note (Signed)
She did not start prozac, will rx lexapro 10 mg daily instead which she can crush and put in applesauce. Advised to see PCP in 1-2 months and call if unable to take medication for some reason.

## 2019-12-28 ENCOUNTER — Other Ambulatory Visit: Payer: 59

## 2020-06-03 ENCOUNTER — Ambulatory Visit (INDEPENDENT_AMBULATORY_CARE_PROVIDER_SITE_OTHER): Payer: 59

## 2020-06-03 ENCOUNTER — Other Ambulatory Visit: Payer: Self-pay

## 2020-06-03 ENCOUNTER — Ambulatory Visit: Payer: 59 | Admitting: Family

## 2020-06-03 ENCOUNTER — Encounter: Payer: Self-pay | Admitting: Family

## 2020-06-03 VITALS — BP 122/74 | HR 95 | Temp 98.4°F | Ht 67.0 in | Wt 205.6 lb

## 2020-06-03 DIAGNOSIS — R7309 Other abnormal glucose: Secondary | ICD-10-CM | POA: Diagnosis not present

## 2020-06-03 DIAGNOSIS — M5442 Lumbago with sciatica, left side: Secondary | ICD-10-CM

## 2020-06-03 DIAGNOSIS — R5383 Other fatigue: Secondary | ICD-10-CM | POA: Diagnosis not present

## 2020-06-03 LAB — CBC WITH DIFFERENTIAL/PLATELET
Basophils Absolute: 0.1 10*3/uL (ref 0.0–0.1)
Basophils Relative: 1 % (ref 0.0–3.0)
Eosinophils Absolute: 0.2 10*3/uL (ref 0.0–0.7)
Eosinophils Relative: 2.4 % (ref 0.0–5.0)
HCT: 44.3 % (ref 36.0–46.0)
Hemoglobin: 14.9 g/dL (ref 12.0–15.0)
Lymphocytes Relative: 34 % (ref 12.0–46.0)
Lymphs Abs: 2.5 10*3/uL (ref 0.7–4.0)
MCHC: 33.6 g/dL (ref 30.0–36.0)
MCV: 88.7 fl (ref 78.0–100.0)
Monocytes Absolute: 0.6 10*3/uL (ref 0.1–1.0)
Monocytes Relative: 8.6 % (ref 3.0–12.0)
Neutro Abs: 4 10*3/uL (ref 1.4–7.7)
Neutrophils Relative %: 54 % (ref 43.0–77.0)
Platelets: 336 10*3/uL (ref 150.0–400.0)
RBC: 4.99 Mil/uL (ref 3.87–5.11)
RDW: 12.9 % (ref 11.5–15.5)
WBC: 7.5 10*3/uL (ref 4.0–10.5)

## 2020-06-03 LAB — COMPREHENSIVE METABOLIC PANEL
ALT: 23 U/L (ref 0–35)
AST: 22 U/L (ref 0–37)
Albumin: 4 g/dL (ref 3.5–5.2)
Alkaline Phosphatase: 88 U/L (ref 39–117)
BUN: 12 mg/dL (ref 6–23)
CO2: 29 mEq/L (ref 19–32)
Calcium: 9.6 mg/dL (ref 8.4–10.5)
Chloride: 104 mEq/L (ref 96–112)
Creatinine, Ser: 0.84 mg/dL (ref 0.40–1.20)
GFR: 86.13 mL/min (ref >60.00)
Glucose, Bld: 84 mg/dL (ref 70–99)
Potassium: 3.7 mEq/L (ref 3.5–5.1)
Sodium: 140 mEq/L (ref 135–145)
Total Bilirubin: 0.6 mg/dL (ref 0.2–1.2)
Total Protein: 7.1 g/dL (ref 6.0–8.3)

## 2020-06-03 LAB — HEMOGLOBIN A1C: Hgb A1c MFr Bld: 5.7 % (ref 4.6–6.5)

## 2020-06-03 NOTE — Patient Instructions (Signed)
Sciatica  Sciatica is pain, weakness, tingling, or loss of feeling (numbness) along the sciatic nerve. The sciatic nerve starts in the lower back and goes down the back of each leg. Sciatica usually goes away on its own or with treatment. Sometimes, sciatica may come back (recur). What are the causes? This condition happens when the sciatic nerve is pinched or has pressure put on it. This may be the result of:  A disk in between the bones of the spine bulging out too far (herniated disk).  Changes in the spinal disks that occur with aging.  A condition that affects a muscle in the butt.  Extra bone growth near the sciatic nerve.  A break (fracture) of the area between your hip bones (pelvis).  Pregnancy.  Tumor. This is rare. What increases the risk? You are more likely to develop this condition if you:  Play sports that put pressure or stress on the spine.  Have poor strength and ease of movement (flexibility).  Have had a back injury in the past.  Have had back surgery.  Sit for long periods of time.  Do activities that involve bending or lifting over and over again.  Are very overweight (obese). What are the signs or symptoms? Symptoms can vary from mild to very bad. They may include:  Any of these problems in the lower back, leg, hip, or butt: ? Mild tingling, loss of feeling, or dull aches. ? Burning sensations. ? Sharp pains.  Loss of feeling in the back of the calf or the sole of the foot.  Leg weakness.  Very bad back pain that makes it hard to move. These symptoms may get worse when you cough, sneeze, or laugh. They may also get worse when you sit or stand for long periods of time. How is this treated? This condition often gets better without any treatment. However, treatment may include:  Changing or cutting back on physical activity when you have pain.  Doing exercises and stretching.  Putting ice or heat on the affected area.  Medicines that  help: ? To relieve pain and swelling. ? To relax your muscles.  Shots (injections) of medicines that help to relieve pain, irritation, and swelling.  Surgery. Follow these instructions at home: Medicines  Take over-the-counter and prescription medicines only as told by your doctor.  Ask your doctor if the medicine prescribed to you: ? Requires you to avoid driving or using heavy machinery. ? Can cause trouble pooping (constipation). You may need to take these steps to prevent or treat trouble pooping:  Drink enough fluids to keep your pee (urine) pale yellow.  Take over-the-counter or prescription medicines.  Eat foods that are high in fiber. These include beans, whole grains, and fresh fruits and vegetables.  Limit foods that are high in fat and sugar. These include fried or sweet foods. Managing pain  If told, put ice on the affected area. ? Put ice in a plastic bag. ? Place a towel between your skin and the bag. ? Leave the ice on for 20 minutes, 2-3 times a day.  If told, put heat on the affected area. Use the heat source that your doctor tells you to use, such as a moist heat pack or a heating pad. ? Place a towel between your skin and the heat source. ? Leave the heat on for 20-30 minutes. ? Remove the heat if your skin turns bright red. This is very important if you are unable to feel pain,   heat, or cold. You may have a greater risk of getting burned.      Activity  Return to your normal activities as told by your doctor. Ask your doctor what activities are safe for you.  Avoid activities that make your symptoms worse.  Take short rests during the day. ? When you rest for a long time, do some physical activity or stretching between periods of rest. ? Avoid sitting for a long time without moving. Get up and move around at least one time each hour.  Exercise and stretch regularly, as told by your doctor.  Do not lift anything that is heavier than 10 lb (4.5 kg)  while you have symptoms of sciatica. ? Avoid lifting heavy things even when you do not have symptoms. ? Avoid lifting heavy things over and over.  When you lift objects, always lift in a way that is safe for your body. To do this, you should: ? Bend your knees. ? Keep the object close to your body. ? Avoid twisting.   General instructions  Stay at a healthy weight.  Wear comfortable shoes that support your feet. Avoid wearing high heels.  Avoid sleeping on a mattress that is too soft or too hard. You might have less pain if you sleep on a mattress that is firm enough to support your back.  Keep all follow-up visits as told by your doctor. This is important. Contact a doctor if:  You have pain that: ? Wakes you up when you are sleeping. ? Gets worse when you lie down. ? Is worse than the pain you have had in the past. ? Lasts longer than 4 weeks.  You lose weight without trying. Get help right away if:  You cannot control when you pee (urinate) or poop (have a bowel movement).  You have weakness in any of these areas and it gets worse: ? Lower back. ? The area between your hip bones. ? Butt. ? Legs.  You have redness or swelling of your back.  You have a burning feeling when you pee. Summary  Sciatica is pain, weakness, tingling, or loss of feeling (numbness) along the sciatic nerve.  This condition happens when the sciatic nerve is pinched or has pressure put on it.  Sciatica can cause pain, tingling, or loss of feeling (numbness) in the lower back, legs, hips, and butt.  Treatment often includes rest, exercise, medicines, and putting ice or heat on the affected area. This information is not intended to replace advice given to you by your health care provider. Make sure you discuss any questions you have with your health care provider. Document Revised: 02/26/2018 Document Reviewed: 02/26/2018 Elsevier Patient Education  2021 Elsevier Inc.  

## 2020-06-03 NOTE — Progress Notes (Signed)
Angela Hartman is a 40 y.o. female with the following history as recorded in EpicCare:  Patient Active Problem List   Diagnosis Date Noted  . Easy bruising 08/28/2019  . Anxiety 08/28/2019  . Elevated TSH 08/28/2019  . Hyperglycemia 08/28/2019  . Postoperative state 04/20/2016  . Chest pain 12/09/2013  . Smoking 12/09/2013    No current outpatient medications on file.   No current facility-administered medications for this visit.    Allergies: Penicillins and Other  Past Medical History:  Diagnosis Date  . Abnormal Pap smear of cervix    has had several  . Anemia   . Anxiety   . Chest pain 2018   Went to ER, EKG normal, due to anxiety per patient  . GERD (gastroesophageal reflux disease)   . Headache    migraines    Past Surgical History:  Procedure Laterality Date  . CHOLECYSTECTOMY    . CYSTOCELE REPAIR N/A 05/06/2016   Procedure: Repair Vaginal Cuff Dehisance,;  Surgeon: Bobbye Charleston, MD;  Location: [facility];  Service: Gynecology;  Laterality: N/A;  . CYSTOSCOPY N/A 04/20/2016   Procedure: CYSTOSCOPY;  Surgeon: Bobbye Charleston, MD;  Location: [facility];  Service: Gynecology;  Laterality: N/A;  . CYSTOSCOPY  05/06/2016   Procedure: CYSTOSCOPY;  Surgeon: Bobbye Charleston, MD;  Location: [facility];  Service: Gynecology;;  . ROBOTIC ASSISTED TOTAL HYSTERECTOMY WITH BILATERAL SALPINGO OOPHERECTOMY N/A 04/20/2016   Procedure: ROBOTIC ASSISTED TOTAL HYSTERECTOMY WITH BILATERAL SALPINGO OOPHORECTOMY;  Surgeon: Bobbye Charleston, MD;  Location: [facility];  Service: Gynecology;  Laterality: N/A;  . UPPER GASTROINTESTINAL ENDOSCOPY    . WISDOM TOOTH EXTRACTION      Family History  Problem Relation Age of Onset  . Heart attack Father   . Esophageal cancer Neg Hx   . Colon cancer Neg Hx   . Rectal cancer Neg Hx   . Stomach cancer Neg Hx     Social History   Tobacco Use  . Smoking status: Current Every Day Smoker    Packs/day: 1.00    Years: 8.00    Pack years: 8.00     Types: Cigarettes  . Smokeless tobacco: Never Used  Substance Use Topics  . Alcohol use: No    Subjective:   Left sided low back pain- "wraps around into left lower quadrant"/ does feel like pain radiates into left leg; symptoms have been improving recently;  No known injury or trauma; does feel like getting good relief with Tylenol; s/p hysterectomy- no vaginal spotting; considering to start estrogen per GYN due to hot flashes; no changes in bowel movements;   Has been having increased fatigue/ "just feeling blah" for the past 3-4 weeks; not feeling depressed/ anxious; opted to stop taking her Lexapro;   Objective:  Vitals:   06/03/20 1049  BP: 122/74  Pulse: 95  Temp: 98.4 F (36.9 C)  TempSrc: Oral  SpO2: 96%  Weight: 205 lb 9.6 oz (93.3 kg)  Height: 5' 7"  (1.702 m)    General: Well developed, well nourished, in no acute distress  Skin : Warm and dry.  Head: Normocephalic and atraumatic  Lungs: Respirations unlabored;  Abdomen: Soft; nontender; nondistended;  Musculoskeletal: No deformities; no active joint inflammation  Extremities: No edema, cyanosis, clubbing  Vessels: Symmetric bilaterally  Neurologic: Alert and oriented; speech intact; face symmetrical; moves all extremities well; CNII-XII intact without focal deficit   Assessment:  1. Low back pain with left-sided sciatica, unspecified back pain laterality, unspecified chronicity   2. Other  fatigue   3. Elevated glucose     Plan:  1. Suspect sciatica; update lumbar X-ray; patient does not do well swallowing pills- prefers to limit treatment until results are back; offered steroid injection but she defers; may need to consider PT;  2. Will update labs today- follow-up to be determined;encouraged to exercise/ work on self care management; TSH has been borderline in the past- to consider low dose trial of Synthroid;  3. Check Hgba1c;   This visit occurred during the SARS-CoV-2 public health emergency.  Safety  protocols were in place, including screening questions prior to the visit, additional usage of staff PPE, and extensive cleaning of exam room while observing appropriate contact time as indicated for disinfecting solutions.     No follow-ups on file.  Orders Placed This Encounter  Procedures  . DG Lumbar Spine Complete    Standing Status:   Future    Number of Occurrences:   1    Standing Expiration Date:   06/03/2021    Order Specific Question:   Reason for Exam (SYMPTOM  OR DIAGNOSIS REQUIRED)    Answer:   low back pain    Order Specific Question:   Is patient pregnant?    Answer:   No    Order Specific Question:   Preferred imaging location?    Answer:   [facility]  . CBC with Differential/Platelet    Standing Status:   Future    Standing Expiration Date:   06/03/2021  . Comp Met (CMET)    Standing Status:   Future    Standing Expiration Date:   06/03/2021  . Thyroid Panel With TSH    Standing Status:   Future    Standing Expiration Date:   06/03/2021  . Hemoglobin A1c    Standing Status:   Future    Standing Expiration Date:   06/03/2021    Requested Prescriptions    No prescriptions requested or ordered in this encounter

## 2020-06-04 LAB — THYROID PANEL WITH TSH
Free Thyroxine Index: 2.7 (ref 1.4–3.8)
T3 Uptake: 30 % (ref 22–35)
T4, Total: 9.1 ug/dL (ref 5.1–11.9)
TSH: 2.89 mIU/L

## 2020-06-11 NOTE — Telephone Encounter (Signed)
-----   Message from Carlynn Purl sent at 06/11/2020  9:18 AM EDT ----- This patient left message on my vm about results . I think she was transferred to me by mistake.  Thanks  Stanton Kidney

## 2020-06-11 NOTE — Telephone Encounter (Signed)
I have called pt back and informed her that Mickel Baas is now at [facility]. Pt does want to stay at [facility]. She will make that appointment.  Pt is in agreement about having a MRI done of the back.

## 2020-06-15 ENCOUNTER — Other Ambulatory Visit: Payer: Self-pay | Admitting: Family

## 2020-06-15 DIAGNOSIS — M5416 Radiculopathy, lumbar region: Secondary | ICD-10-CM

## 2020-12-24 ENCOUNTER — Telehealth: Payer: Self-pay | Admitting: Gastroenterology

## 2020-12-24 NOTE — Telephone Encounter (Signed)
Patient with history of GERD with esophagitis, last EGD 03/2019. She has not followed up.  Called her back to discuss and hopefully find a suitable appointment for her. No answer. Left her a message of my call. Also offered to communicate with her over My Chart.

## 2020-12-24 NOTE — Telephone Encounter (Signed)
Patient called stating she felt like her ulcers are "acting up." I offered her several appointments, but she works at night and sleeps during the day and she refused any I offered her.  Can you please call and advise if there's anything else she can try to alleviate the pain?  Thank you.

## 2021-01-11 NOTE — Telephone Encounter (Signed)
Patients husband called asking to see if the appt for 01/21/21 can be moved up for a sooner day because the patients symptoms are getting worst vomiting after meals and having abdominal pains. Requested a call back.

## 2021-01-11 NOTE — Telephone Encounter (Signed)
Spoke with the spouse who brought patient in on a 3 way call. She accepts the earlier appointment with Nicoletta Ba 01/13/21 at 11:30 am. Appointment with Dr Silverio Decamp on 01/21/21 cancelled.

## 2021-01-13 ENCOUNTER — Ambulatory Visit: Payer: 59 | Admitting: Physician Assistant

## 2021-01-13 ENCOUNTER — Other Ambulatory Visit (INDEPENDENT_AMBULATORY_CARE_PROVIDER_SITE_OTHER): Payer: 59

## 2021-01-13 ENCOUNTER — Encounter: Payer: Self-pay | Admitting: Physician Assistant

## 2021-01-13 VITALS — BP 110/74 | HR 96 | Ht 66.0 in | Wt 214.2 lb

## 2021-01-13 DIAGNOSIS — R112 Nausea with vomiting, unspecified: Secondary | ICD-10-CM

## 2021-01-13 DIAGNOSIS — R1010 Upper abdominal pain, unspecified: Secondary | ICD-10-CM

## 2021-01-13 DIAGNOSIS — K219 Gastro-esophageal reflux disease without esophagitis: Secondary | ICD-10-CM

## 2021-01-13 DIAGNOSIS — Z9049 Acquired absence of other specified parts of digestive tract: Secondary | ICD-10-CM

## 2021-01-13 LAB — CBC WITH DIFFERENTIAL/PLATELET
Basophils Absolute: 0 10*3/uL (ref 0.0–0.1)
Basophils Relative: 0.3 % (ref 0.0–3.0)
Eosinophils Absolute: 0.2 10*3/uL (ref 0.0–0.7)
Eosinophils Relative: 3.1 % (ref 0.0–5.0)
HCT: 43 % (ref 36.0–46.0)
Hemoglobin: 14.3 g/dL (ref 12.0–15.0)
Lymphocytes Relative: 31.3 % (ref 12.0–46.0)
Lymphs Abs: 2.5 10*3/uL (ref 0.7–4.0)
MCHC: 33.3 g/dL (ref 30.0–36.0)
MCV: 89.3 fl (ref 78.0–100.0)
Monocytes Absolute: 0.6 10*3/uL (ref 0.1–1.0)
Monocytes Relative: 7.6 % (ref 3.0–12.0)
Neutro Abs: 4.6 10*3/uL (ref 1.4–7.7)
Neutrophils Relative %: 57.7 % (ref 43.0–77.0)
Platelets: 290 10*3/uL (ref 150.0–400.0)
RBC: 4.82 Mil/uL (ref 3.87–5.11)
RDW: 13 % (ref 11.5–15.5)
WBC: 7.9 10*3/uL (ref 4.0–10.5)

## 2021-01-13 LAB — LIPASE: Lipase: 36 U/L (ref 11.0–59.0)

## 2021-01-13 LAB — COMPREHENSIVE METABOLIC PANEL
ALT: 19 U/L (ref 0–35)
AST: 21 U/L (ref 0–37)
Albumin: 4.2 g/dL (ref 3.5–5.2)
Alkaline Phosphatase: 85 U/L (ref 39–117)
BUN: 12 mg/dL (ref 6–23)
CO2: 28 mEq/L (ref 19–32)
Calcium: 8.9 mg/dL (ref 8.4–10.5)
Chloride: 105 mEq/L (ref 96–112)
Creatinine, Ser: 0.85 mg/dL (ref 0.40–1.20)
GFR: 84.55 mL/min (ref >60.00)
Glucose, Bld: 97 mg/dL (ref 70–99)
Potassium: 3.5 mEq/L (ref 3.5–5.1)
Sodium: 139 mEq/L (ref 135–145)
Total Bilirubin: 0.7 mg/dL (ref 0.2–1.2)
Total Protein: 6.7 g/dL (ref 6.0–8.3)

## 2021-01-13 LAB — SEDIMENTATION RATE: Sed Rate: 9 mm/hr (ref 0–20)

## 2021-01-13 MED ORDER — OMEPRAZOLE 40 MG PO CPDR: 40.0000 mg | DELAYED_RELEASE_CAPSULE | Freq: Two times a day (BID) | ORAL | 3 refills | Status: DC

## 2021-01-13 NOTE — Progress Notes (Signed)
Subjective:    Patient ID: Angela Hartman, female    DOB: [birth year], 40 y.o.   MRN: [id number]  HPI Angela Hartman is a pleasant 40 year old white female, established with Dr. Silverio Decamp, who comes in today with new onset of abdominal pain and vomiting. 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 Now with new onset over the past 2 to 3 weeks of generalized upper abdominal pain which she says has been present 24 hours/day and has been keeping her up at night.  She says it is difficult to describe the pain.  She has not been having any nausea but says she has been having random episodes of vomiting several times per week.  Sometimes this occurs after eating and sometimes several hours after she has eaten she has had some heartburn intermittently and has been using over-the-counter omeprazole .Over the past few days she is developed some low back discomfort which is new, she has noticed that her bowel movements have been a little bit more frequent but not diarrhea, sometimes a bit darker but no black stools or bloody stools.  No fever or chills. She has been using Tylenol as needed for pain, no recent aspirin or NSAID use, no regular EtOH.  She has never had any similar pain.  Review of Systems Pertinent positive and negative review of systems were noted in the above HPI section.  All other review of systems was otherwise negative.   Outpatient Encounter Medications as of 01/13/2021  Medication Sig   Cimetidine (HEARTBURN RELIEF PO) Take 1 capsule by mouth as needed.   omeprazole (PRILOSEC) 40 MG capsule Take 1 capsule (40 mg total) by mouth 2 (two)  times daily at 8 am and 10 pm.   ondansetron (ZOFRAN-ODT) 8 MG disintegrating tablet Take by mouth. (Patient not taking: Reported on 01/13/2021)   rizatriptan (MAXALT-MLT) 10 MG disintegrating tablet Take 10 mg by mouth daily as needed. (Patient not taking: Reported on 01/13/2021)   [DISCONTINUED] esomeprazole (NEXIUM) 20 MG capsule Open 1 capsule into applesauce and swallow by mouth twice daily (Patient not taking: Reported on 01/23/2018)   No facility-administered encounter medications on file as of 01/13/2021.   Allergies  Allergen Reactions   Penicillins Hives    Has patient had a PCN reaction causing immediate rash, facial/tongue/throat swelling, SOB or lightheadedness with hypotension: Yes Has patient had a PCN reaction causing severe rash involving mucus membranes or skin necrosis: No Has patient had a PCN reaction that required hospitalization: No Has patient had a PCN reaction occurring within the last 10 years: No If all of the above answers are "NO", then may proceed with Cephalosporin use.    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  Tobacco Use   Smoking status: Every Day    Packs/day: 1.00    Years: 8.00    Pack years: 8.00    Types: Cigarettes   Smokeless tobacco: Never  Vaping Use   Vaping Use: Never used  Substance and Sexual Activity   Alcohol use: No   Drug use: No   Sexual activity: Not on file    Comment: surgical - hysterectomy  Other Topics Concern   Not  on file  Social History Narrative   Not on file   Social Determinants of Health   Financial Resource Strain: Not on file  Food Insecurity: Not on file  Transportation Needs: Not on file  Physical Activity: Not on file  Stress: Not on file  Social Connections: Not on file  Intimate Partner Violence: Not on file    Ms. Rantz's family history includes Heart attack in her father.      Objective:    Vitals:   01/13/21 1129  BP: 110/74  Pulse: 96    Physical Exam Well-developed well-nourished white female in no acute distress.  Weight, 214 BMI 34.5  HEENT; nontraumatic normocephalic, EOMI, PE R LA, sclera anicteric. Oropharynx; not examined today Neck; supple, no JVD Cardiovascular; regular rate and rhythm with S1-S2, no murmur rub or gallop Pulmonary; Clear bilaterally Abdomen; soft, nondistended, she is tender across the upper abdomen, no guarding or rebound, no palpable mass or hepatosplenomegaly, bowel sounds are active Rectal; not done today Skin; benign exam, no jaundice rash or appreciable lesions Extremities; no clubbing cyanosis or edema skin warm and dry Neuro/Psych; alert and oriented x4, grossly nonfocal mood and affect appropriate        Assessment & Plan:   #32 40 year old white female with 2 to 3-week history of constant mid and upper abdominal pain, sometimes worse at night and keeping her from sleeping. This is been associated with intermittent episodes of vomiting, sometimes immediately postprandially and other times several hours after trying to eat.  Her pain has been worse after eating.  No nausea, no fever or chills, no significant changes in bowel habits no melena or hematochezia  Etiology of symptoms is not clear, patient is status postcholecystectomy.  Rule out pancreatitis, rule out gastritis, rule out peptic ulcer disease, rule out other intra-abdominal inflammatory process.  Rule out low-grade obstructive process  #2 history of GERD and  previously documented grade C esophagitis at EGD 2021 also with 6 cm hiatal hernia-patient had not been having ongoing symptoms and has not been taking PPI regularly, she uses OTC omeprazole as needed  #3 status postcholecystectomy 4.  Status post hysterectomy 5.  Anxiety  Plan; CBC with differential, c-Met, lipase, sed rate Full liquid to very soft bland diet with frequent small feedings Scheduled for CT scan of the abdomen and pelvis with contrast within the next week Start omeprazole 40 mg p.o. twice daily over the next 2 to 3 weeks . If CT is unremarkable and symptoms are persisting she will likely need repeat EGD   Shirle Provencal S Marit Goodwill PA-C 01/13/2021   Cc: Marrian Salvage,*

## 2021-01-13 NOTE — Patient Instructions (Signed)
If you are age 42 or younger, your body mass index should be between 19-25. Your Body mass index is 34.58 kg/m. If this is out of the aformentioned range listed, please consider follow up with your Primary Care Provider.  ________________________________________________________  The [facility] providers would like to encourage you to use [facility] to communicate with providers for non-urgent requests or questions.  Due to long hold times on the telephone, sending your provider a message by [facility] may be a faster and more efficient way to get a response.  Please allow 48 business hours for a response.  Please remember that this is for non-urgent requests.  _______________________________________________________  Your provider has requested that you go to the basement level for lab work before leaving today. Press "B" on the elevator. The lab is located at the first door on the left as you exit the elevator.  You have been scheduled for a CT scan of the abdomen and pelvis at [facility] ([address]---this is in the same building as [employer]).   You are scheduled on _______ at _______. You should arrive 15 minutes prior to your appointment time for registration. Please follow the written instructions below on the day of your exam:  WARNING: IF YOU ARE ALLERGIC TO IODINE/X-RAY DYE, PLEASE NOTIFY RADIOLOGY IMMEDIATELY AT [phone]! YOU WILL BE GIVEN A 13 HOUR PREMEDICATION PREP.  1) Do not eat or drink anything after _______ (4 hours prior to your test) 2) You have been given 2 bottles of oral contrast to drink. The solution may taste better if refrigerated, but do NOT add ice or any other liquid to this solution. Shake well before drinking.    Drink 1 bottle of contrast @ _______ (2 hours prior to your exam)  Drink 1 bottle of contrast @ _______ (1 hour prior to your exam)  You may take any medications as prescribed with a small amount of water, if necessary. If you  take any of the following medications: METFORMIN, GLUCOPHAGE, GLUCOVANCE, AVANDAMET, RIOMET, FORTAMET, [place] MET, JANUMET, GLUMETZA or METAGLIP, you MAY be asked to HOLD this medication 48 hours AFTER the exam.  The purpose of you drinking the oral contrast is to aid in the visualization of your intestinal tract. The contrast solution may cause some diarrhea. Depending on your individual set of symptoms, you may also receive an intravenous injection of x-ray contrast/dye. Plan on being at [facility] for 30 minutes or longer, depending on the type of exam you are having performed.  This test typically takes 30-45 minutes to complete.  If you have any questions regarding your exam or if you need to reschedule, you may call the CT department at [phone] between the hours of 8:00 am and 5:00 pm, Monday-Friday.  ________________________________________________________________________  START Omeprazole 40 mg 1 capsule twice daily  You may take Tylenol 2 tablets/capsules every 6 hours as needed for pain  Follow a full liquid to very soft diet. Try to get in frequent meals.  Follow up pending the results of your CT.  Thank you for entrusting me with your care and choosing [facility].  Amy Esterwood, PA-C

## 2021-01-19 ENCOUNTER — Ambulatory Visit (INDEPENDENT_AMBULATORY_CARE_PROVIDER_SITE_OTHER)
Admission: RE | Admit: 2021-01-19 | Discharge: 2021-01-19 | Disposition: A | Discharge status: Home / Self Care | Payer: 59 | Source: Ambulatory Visit | Attending: Physician Assistant | Admitting: Physician Assistant

## 2021-01-19 ENCOUNTER — Other Ambulatory Visit: Payer: Self-pay

## 2021-01-19 DIAGNOSIS — R1010 Upper abdominal pain, unspecified: Secondary | ICD-10-CM

## 2021-01-19 DIAGNOSIS — R112 Nausea with vomiting, unspecified: Secondary | ICD-10-CM

## 2021-01-19 DIAGNOSIS — Z9049 Acquired absence of other specified parts of digestive tract: Secondary | ICD-10-CM

## 2021-01-19 DIAGNOSIS — K219 Gastro-esophageal reflux disease without esophagitis: Secondary | ICD-10-CM

## 2021-01-19 MED ORDER — IOHEXOL 300 MG/ML  SOLN
100.0000 mL | Freq: Once | INTRAMUSCULAR | Status: AC | PRN
  Administered 2021-01-19: 100 mL via INTRAVENOUS

## 2021-01-21 ENCOUNTER — Ambulatory Visit: Payer: 59 | Admitting: Gastroenterology

## 2021-03-02 NOTE — Progress Notes (Signed)
Reviewed and agree with documentation and assessment and plan. K. Veena Montrez [place] , MD   

## 2021-08-17 ENCOUNTER — Ambulatory Visit: Payer: 59 | Admitting: Internal Medicine

## 2021-08-17 ENCOUNTER — Telehealth: Payer: Self-pay | Admitting: Family

## 2021-08-17 ENCOUNTER — Encounter: Payer: Self-pay | Admitting: Internal Medicine

## 2021-08-17 VITALS — BP 130/80 | HR 87 | Resp 18 | Ht 66.0 in | Wt 217.0 lb

## 2021-08-17 DIAGNOSIS — M79672 Pain in left foot: Secondary | ICD-10-CM

## 2021-08-17 DIAGNOSIS — M79673 Pain in unspecified foot: Secondary | ICD-10-CM | POA: Insufficient documentation

## 2021-08-17 DIAGNOSIS — M79605 Pain in left leg: Secondary | ICD-10-CM | POA: Diagnosis not present

## 2021-08-17 DIAGNOSIS — R739 Hyperglycemia, unspecified: Secondary | ICD-10-CM | POA: Diagnosis not present

## 2021-08-17 DIAGNOSIS — M79671 Pain in right foot: Secondary | ICD-10-CM

## 2021-08-17 DIAGNOSIS — R635 Abnormal weight gain: Secondary | ICD-10-CM | POA: Diagnosis not present

## 2021-08-17 DIAGNOSIS — M79604 Pain in right leg: Secondary | ICD-10-CM | POA: Diagnosis not present

## 2021-08-17 DIAGNOSIS — M7989 Other specified soft tissue disorders: Secondary | ICD-10-CM | POA: Diagnosis not present

## 2021-08-17 LAB — COMPREHENSIVE METABOLIC PANEL
ALT: 48 U/L — ABNORMAL HIGH (ref 0–35)
AST: 30 U/L (ref 0–37)
Albumin: 4.2 g/dL (ref 3.5–5.2)
Alkaline Phosphatase: 88 U/L (ref 39–117)
BUN: 18 mg/dL (ref 6–23)
CO2: 25 mEq/L (ref 19–32)
Calcium: 9.2 mg/dL (ref 8.4–10.5)
Chloride: 104 mEq/L (ref 96–112)
Creatinine, Ser: 0.81 mg/dL (ref 0.40–1.20)
GFR: 89.22 mL/min (ref >60.00)
Glucose, Bld: 99 mg/dL (ref 70–99)
Potassium: 3.6 mEq/L (ref 3.5–5.1)
Sodium: 136 mEq/L (ref 135–145)
Total Bilirubin: 0.7 mg/dL (ref 0.2–1.2)
Total Protein: 6.9 g/dL (ref 6.0–8.3)

## 2021-08-17 LAB — TSH: TSH: 2.66 u[IU]/mL (ref 0.35–5.50)

## 2021-08-17 LAB — VITAMIN B12: Vitamin B-12: 418 pg/mL (ref 211–911)

## 2021-08-17 LAB — CBC
HCT: 43.7 % (ref 36.0–46.0)
Hemoglobin: 14.8 g/dL (ref 12.0–15.0)
MCHC: 33.8 g/dL (ref 30.0–36.0)
MCV: 89.3 fl (ref 78.0–100.0)
Platelets: 262 10*3/uL (ref 150.0–400.0)
RBC: 4.9 Mil/uL (ref 3.87–5.11)
RDW: 13.4 % (ref 11.5–15.5)
WBC: 5.8 10*3/uL (ref 4.0–10.5)

## 2021-08-17 LAB — VITAMIN D 25 HYDROXY (VIT D DEFICIENCY, FRACTURES): VITD: 27.61 ng/mL — ABNORMAL LOW (ref 30.00–100.00)

## 2021-08-17 LAB — HEMOGLOBIN A1C: Hgb A1c MFr Bld: 5.8 % (ref 4.6–6.5)

## 2021-08-17 LAB — CK: Total CK: 122 U/L (ref 7–177)

## 2021-08-17 MED ORDER — MELOXICAM 15 MG PO TABS: 15.0000 mg | ORAL_TABLET | Freq: Every day | ORAL | 0 refills | Status: DC

## 2021-08-17 NOTE — Assessment & Plan Note (Signed)
Checking HgA1c as none in some time.

## 2021-08-17 NOTE — Assessment & Plan Note (Signed)
Started around the same time as the leg pain. Suspect plantar fasciitis and given stretching exercises. Rx meloxicam 15 mg daily to help with inflammation. Follow up PCP 1-2 weeks.

## 2021-08-17 NOTE — Telephone Encounter (Signed)
PT calls today with an interest of doing a TOC to Dr.Crawford. PT states that she was not aware that Ria Clock had moved from the [address] [facility] office and would like to remain here if possible. I had let PT know that I would need an OK from both providers before proceeding with making the appointment.

## 2021-08-18 ENCOUNTER — Ambulatory Visit (HOSPITAL_COMMUNITY)
Admission: RE | Admit: 2021-08-18 | Discharge: 2021-08-18 | Disposition: A | Discharge status: Home / Self Care | Payer: 59 | Source: Ambulatory Visit | Attending: Cardiology | Admitting: Cardiology

## 2021-08-18 DIAGNOSIS — M7989 Other specified soft tissue disorders: Secondary | ICD-10-CM | POA: Diagnosis present

## 2021-08-23 NOTE — Telephone Encounter (Signed)
Ok to establish with another provider at [facility]

## 2021-09-29 ENCOUNTER — Encounter (INDEPENDENT_AMBULATORY_CARE_PROVIDER_SITE_OTHER): Payer: Self-pay

## 2022-01-14 ENCOUNTER — Other Ambulatory Visit: Payer: Self-pay

## 2022-01-14 ENCOUNTER — Encounter (HOSPITAL_BASED_OUTPATIENT_CLINIC_OR_DEPARTMENT_OTHER): Payer: Self-pay | Admitting: Emergency Medicine

## 2022-01-14 DIAGNOSIS — R1013 Epigastric pain: Secondary | ICD-10-CM | POA: Diagnosis present

## 2022-01-14 DIAGNOSIS — K449 Diaphragmatic hernia without obstruction or gangrene: Secondary | ICD-10-CM | POA: Diagnosis not present

## 2022-01-14 LAB — COMPREHENSIVE METABOLIC PANEL
ALT: 16 U/L (ref 0–44)
AST: 17 U/L (ref 15–41)
Albumin: 4.5 g/dL (ref 3.5–5.0)
Alkaline Phosphatase: 83 U/L (ref 38–126)
Anion gap: 10 (ref 5–15)
BUN: 14 mg/dL (ref 6–20)
CO2: 27 mmol/L (ref 22–32)
Calcium: 9.7 mg/dL (ref 8.9–10.3)
Chloride: 104 mmol/L (ref 98–111)
Creatinine, Ser: 0.81 mg/dL (ref 0.44–1.00)
GFR, Estimated: >60 mL/min (ref >60)
Glucose, Bld: 108 mg/dL — ABNORMAL HIGH (ref 70–99)
Potassium: 3.7 mmol/L (ref 3.5–5.1)
Sodium: 141 mmol/L (ref 135–145)
Total Bilirubin: 0.3 mg/dL (ref 0.3–1.2)
Total Protein: 7.4 g/dL (ref 6.5–8.1)

## 2022-01-14 LAB — URINALYSIS, ROUTINE W REFLEX MICROSCOPIC
Bilirubin Urine: NEGATIVE
Glucose, UA: NEGATIVE mg/dL
Hgb urine dipstick: NEGATIVE
Ketones, ur: NEGATIVE mg/dL
Leukocytes,Ua: NEGATIVE
Nitrite: NEGATIVE
Protein, ur: NEGATIVE mg/dL
Specific Gravity, Urine: 1.025 (ref 1.005–1.030)
pH: 6 (ref 5.0–8.0)

## 2022-01-14 LAB — CBC
HCT: 46 % (ref 36.0–46.0)
Hemoglobin: 15.1 g/dL — ABNORMAL HIGH (ref 12.0–15.0)
MCH: 29.7 pg (ref 26.0–34.0)
MCHC: 32.8 g/dL (ref 30.0–36.0)
MCV: 90.6 fL (ref 80.0–100.0)
Platelets: 322 10*3/uL (ref 150–400)
RBC: 5.08 MIL/uL (ref 3.87–5.11)
RDW: 12.7 % (ref 11.5–15.5)
WBC: 9.7 10*3/uL (ref 4.0–10.5)
nRBC: 0 % (ref 0.0–0.2)

## 2022-01-14 LAB — LIPASE, BLOOD: Lipase: 52 U/L — ABNORMAL HIGH (ref 11–51)

## 2022-01-14 NOTE — ED Triage Notes (Signed)
Abdo pain x 2 weeks. Some vomiting "when I try to eat more than a little bit" Abdo is tender in epigastric region.

## 2022-01-15 ENCOUNTER — Emergency Department (HOSPITAL_BASED_OUTPATIENT_CLINIC_OR_DEPARTMENT_OTHER): Payer: 59

## 2022-01-15 ENCOUNTER — Emergency Department (HOSPITAL_BASED_OUTPATIENT_CLINIC_OR_DEPARTMENT_OTHER)
Admission: EM | Admit: 2022-01-15 | Discharge: 2022-01-15 | Disposition: A | Discharge status: Home / Self Care | Payer: 59 | Attending: Emergency Medicine | Admitting: Emergency Medicine

## 2022-01-15 DIAGNOSIS — K449 Diaphragmatic hernia without obstruction or gangrene: Secondary | ICD-10-CM

## 2022-01-15 MED ORDER — DICYCLOMINE HCL 20 MG PO TABS: 20.0000 mg | ORAL_TABLET | Freq: Two times a day (BID) | ORAL | 0 refills | Status: DC

## 2022-01-15 MED ORDER — SODIUM CHLORIDE 0.9 % IV BOLUS
500.0000 mL | Freq: Once | INTRAVENOUS | Status: AC
  Administered 2022-01-15: 500 mL via INTRAVENOUS

## 2022-01-15 MED ORDER — DROPERIDOL 2.5 MG/ML IJ SOLN
1.2500 mg | Freq: Once | INTRAMUSCULAR | Status: AC
  Administered 2022-01-15: 1.25 mg via INTRAVENOUS
  Filled 2022-01-15: qty 2

## 2022-01-15 MED ORDER — DICYCLOMINE HCL 10 MG/ML IM SOLN
20.0000 mg | Freq: Once | INTRAMUSCULAR | Status: AC
  Administered 2022-01-15: 20 mg via INTRAMUSCULAR
  Filled 2022-01-15: qty 2

## 2022-01-15 MED ORDER — KETOROLAC TROMETHAMINE 30 MG/ML IJ SOLN
30.0000 mg | Freq: Once | INTRAMUSCULAR | Status: AC
  Administered 2022-01-15: 30 mg via INTRAVENOUS

## 2022-01-15 MED ORDER — SUCRALFATE 1 GM/10ML PO SUSP: 1.0000 g | Freq: Three times a day (TID) | ORAL | 0 refills | Status: DC

## 2022-01-15 NOTE — ED Provider Notes (Signed)
[place] EMERGENCY DEPT Provider Note   CSN: [id number] Arrival date & time: 01/14/22  2002     History  Chief Complaint  Patient presents with   Abdominal Pain    Angela Hartman is a 41 y.o. female.  The history is provided by the patient.  Abdominal Pain Pain location:  Epigastric Pain radiates to:  Does not radiate Pain severity:  Moderate Onset quality:  Gradual Timing:  Constant Progression:  Worsening Chronicity:  New Context: not eating and not trauma   Relieved by:  Nothing Worsened by:  Nothing Ineffective treatments:  None tried Associated symptoms: nausea   Associated symptoms: no chills, no constipation, no cough, no diarrhea, no dysuria and no fever   Risk factors: not pregnant   Patient with GERD presents with upper abdominal pain.  No fevers no diarrhea.  No dysuria.       Home Medications Prior to Admission medications   Medication Sig Start Date End Date Taking? Authorizing Provider  dicyclomine (BENTYL) 20 MG tablet Take 1 tablet (20 mg total) by mouth 2 (two) times daily. 01/15/22  Yes Reannon Candella, MD  sucralfate (CARAFATE) 1 GM/10ML suspension Take 10 mLs (1 g total) by mouth 4 (four) times daily -  with meals and at bedtime. 01/15/22  Yes Dail Meece, MD  meloxicam (MOBIC) 15 MG tablet Take 1 tablet (15 mg total) by mouth daily. 08/17/21   Hoyt Koch, MD  omeprazole (PRILOSEC) 40 MG capsule Take 1 capsule (40 mg total) by mouth 2 (two) times daily at 8 am and 10 pm. 01/13/21   Esterwood, Amy S, PA-C  ondansetron (ZOFRAN-ODT) 8 MG disintegrating tablet Take by mouth. 12/23/20   [provider]  rizatriptan (MAXALT-MLT) 10 MG disintegrating tablet Take 10 mg by mouth daily as needed. 12/23/20   [provider]  esomeprazole (NEXIUM) 20 MG capsule Open 1 capsule into applesauce and swallow by mouth twice daily Patient not taking: Reported on 01/23/2018 12/25/17 01/29/18  Mauri Pole, MD       Allergies    Penicillins and Other    Review of Systems   Review of Systems  Constitutional:  Negative for chills and fever.  Respiratory:  Negative for cough.   Gastrointestinal:  Positive for abdominal pain and nausea. Negative for constipation and diarrhea.  Genitourinary:  Negative for dysuria.  All other systems reviewed and are negative.   Physical Exam Updated Vital Signs BP 122/78   Pulse 67   Temp 98.8 F (37.1 C) (Oral)   Resp 18   Ht '5\' 7"'$  (1.702 m)   Wt 95.3 kg   LMP 04/05/2016 (Exact Date)   SpO2 96%   BMI 32.89 kg/m  Physical Exam  ED Results / Procedures / Treatments   Labs (all labs ordered are listed, but only abnormal results are displayed) Results for orders placed or performed during the hospital encounter of 01/15/22  Lipase, blood  Result Value Ref Range   Lipase 52 (H) 11 - 51 U/L  Comprehensive metabolic panel  Result Value Ref Range   Sodium 141 135 - 145 mmol/L   Potassium 3.7 3.5 - 5.1 mmol/L   Chloride 104 98 - 111 mmol/L   CO2 27 22 - 32 mmol/L   Glucose, Bld 108 (H) 70 - 99 mg/dL   BUN 14 6 - 20 mg/dL   Creatinine, Ser 0.81 0.44 - 1.00 mg/dL   Calcium 9.7 8.9 - 10.3 mg/dL   Total Protein 7.4 6.5 -  8.1 g/dL   Albumin 4.5 3.5 - 5.0 g/dL   AST 17 15 - 41 U/L   ALT 16 0 - 44 U/L   Alkaline Phosphatase 83 38 - 126 U/L   Total Bilirubin 0.3 0.3 - 1.2 mg/dL   GFR, Estimated >60 >60 mL/min   Anion gap 10 5 - 15  CBC  Result Value Ref Range   WBC 9.7 4.0 - 10.5 K/uL   RBC 5.08 3.87 - 5.11 MIL/uL   Hemoglobin 15.1 (H) 12.0 - 15.0 g/dL   HCT 46.0 36.0 - 46.0 %   MCV 90.6 80.0 - 100.0 fL   MCH 29.7 26.0 - 34.0 pg   MCHC 32.8 30.0 - 36.0 g/dL   RDW 12.7 11.5 - 15.5 %   Platelets 322 150 - 400 K/uL   nRBC 0.0 0.0 - 0.2 %  Urinalysis, Routine w reflex microscopic Urine, Clean Catch  Result Value Ref Range   Color, Urine YELLOW YELLOW   APPearance CLEAR CLEAR   Specific Gravity, Urine 1.025 1.005 - 1.030   pH 6.0 5.0 - 8.0    Glucose, UA NEGATIVE NEGATIVE mg/dL   Hgb urine dipstick NEGATIVE NEGATIVE   Bilirubin Urine NEGATIVE NEGATIVE   Ketones, ur NEGATIVE NEGATIVE mg/dL   Protein, ur NEGATIVE NEGATIVE mg/dL   Nitrite NEGATIVE NEGATIVE   Leukocytes,Ua NEGATIVE NEGATIVE   CT Renal Stone Study  Result Date: 01/15/2022 CLINICAL DATA:  Abdominal/flank pain, stone suspected. Vomiting with abdominal tenderness and epigastric region. EXAM: CT ABDOMEN AND PELVIS WITHOUT CONTRAST TECHNIQUE: Multidetector CT imaging of the abdomen and pelvis was performed following the standard protocol without IV contrast. RADIATION DOSE REDUCTION: This exam was performed according to the departmental dose-optimization program which includes automated exposure control, adjustment of the mA and/or kV according to patient size and/or use of iterative reconstruction technique. COMPARISON:  01/19/2021. FINDINGS: Lower chest: Heart is normal in size and there is no pericardial effusion. Dependent atelectasis is present at the lung bases. Hepatobiliary: No focal liver abnormality is seen. Status post cholecystectomy. No biliary dilatation. Pancreas: Unremarkable. No pancreatic ductal dilatation or surrounding inflammatory changes. Spleen: Normal in size without focal abnormality. Adrenals/Urinary Tract: The adrenal glands are within normal limits. No renal calculus or hydronephrosis. No ureteral calculus or obstructive uropathy. The bladder is unremarkable. Stomach/Bowel: There is a moderate hiatal hernia. The stomach is otherwise within normal limits. No free air or pneumatosis. Appendix appears normal. No evidence of bowel wall thickening, distention, or inflammatory changes. Vascular/Lymphatic: Aortic atherosclerosis. No enlarged abdominal or pelvic lymph nodes. Reproductive: Status post hysterectomy. No adnexal masses. Other: No abdominopelvic ascites.  Fat containing umbilical hernia. Musculoskeletal: No acute osseous abnormality. IMPRESSION: 1. No  renal calculus or hydronephrosis. 2. Moderate hiatal hernia. 3. Aortic atherosclerosis. Electronically Signed   By: Brett Fairy M.D.   On: 01/15/2022 01:35     Radiology CT Renal Stone Study  Result Date: 01/15/2022 CLINICAL DATA:  Abdominal/flank pain, stone suspected. Vomiting with abdominal tenderness and epigastric region. EXAM: CT ABDOMEN AND PELVIS WITHOUT CONTRAST TECHNIQUE: Multidetector CT imaging of the abdomen and pelvis was performed following the standard protocol without IV contrast. RADIATION DOSE REDUCTION: This exam was performed according to the departmental dose-optimization program which includes automated exposure control, adjustment of the mA and/or kV according to patient size and/or use of iterative reconstruction technique. COMPARISON:  01/19/2021. FINDINGS: Lower chest: Heart is normal in size and there is no pericardial effusion. Dependent atelectasis is present at the lung bases. Hepatobiliary: No  focal liver abnormality is seen. Status post cholecystectomy. No biliary dilatation. Pancreas: Unremarkable. No pancreatic ductal dilatation or surrounding inflammatory changes. Spleen: Normal in size without focal abnormality. Adrenals/Urinary Tract: The adrenal glands are within normal limits. No renal calculus or hydronephrosis. No ureteral calculus or obstructive uropathy. The bladder is unremarkable. Stomach/Bowel: There is a moderate hiatal hernia. The stomach is otherwise within normal limits. No free air or pneumatosis. Appendix appears normal. No evidence of bowel wall thickening, distention, or inflammatory changes. Vascular/Lymphatic: Aortic atherosclerosis. No enlarged abdominal or pelvic lymph nodes. Reproductive: Status post hysterectomy. No adnexal masses. Other: No abdominopelvic ascites.  Fat containing umbilical hernia. Musculoskeletal: No acute osseous abnormality. IMPRESSION: 1. No renal calculus or hydronephrosis. 2. Moderate hiatal hernia. 3. Aortic  atherosclerosis. Electronically Signed   By: Brett Fairy M.D.   On: 01/15/2022 01:35    Procedures Procedures    Medications Ordered in ED Medications  droperidol (INAPSINE) 2.5 MG/ML injection 1.25 mg (1.25 mg Intravenous Given 01/15/22 0204)  dicyclomine (BENTYL) injection 20 mg (20 mg Intramuscular Given 01/15/22 0210)  ketorolac (TORADOL) 30 MG/ML injection 30 mg (30 mg Intravenous Given 01/15/22 0205)  sodium chloride 0.9 % bolus 500 mL (0 mLs Intravenous Stopped 01/15/22 0234)    ED Course/ Medical Decision Making/ A&P                           Medical Decision Making Patient with upper abdominal pain and intermittent nausea and vomiting.  Patient has a h/o GERD on PPI but has missed doses from time to time   Amount and/or Complexity of Data Reviewed Independent Historian: spouse    Details: See above  External Data Reviewed: notes.    Details: Previous notes reviewed  Labs: ordered.    Details: All labs reviewed:  negative urine, lipase slight elevated 52, normal sodium 141 and potassium 3.7, normal creatinine and LFTs. Marland Kitchen White count normal 9.7, normal hemoglobin Radiology: ordered and independent interpretation performed.    Details: No obstructions appendix normal on CT  Risk Prescription drug management. Risk Details: I do not believe this is pancreatitis.  I do not believe this is biliary colic based on exam, labs and imaging.  I suspect this is related to patient's known GERD and hiatal hernia.  Well appearing, stable for discharge with close follow up.  Please follow up with gastroenterology.      Final Clinical Impression(s) / ED Diagnoses Final diagnoses:  Hiatal hernia   Return for intractable cough, coughing up blood, fevers > 100.4 unrelieved by medication, shortness of breath, intractable vomiting, chest pain, shortness of breath, weakness, numbness, changes in speech, facial asymmetry, abdominal pain, passing out, Inability to tolerate liquids or food,  cough, altered mental status or any concerns. No signs of systemic illness or infection. The patient is nontoxic-appearing on exam and vital signs are within normal limits.  I have reviewed the triage vital signs and the nursing notes. Pertinent labs & imaging results that were available during my care of the patient were reviewed by me and considered in my medical decision making (see chart for details). After history, exam, and medical workup I feel the patient has been appropriately medically screened and is safe for discharge home. Pertinent diagnoses were discussed with the patient. Patient was given return precautions.  Rx / DC Orders ED Discharge Orders          Ordered    dicyclomine (BENTYL) 20 MG tablet  2  times daily        01/15/22 0257    sucralfate (CARAFATE) 1 GM/10ML suspension  3 times daily with meals & bedtime        01/15/22 0257              Fishel Wamble, MD 01/15/22 0501

## 2022-01-19 ENCOUNTER — Ambulatory Visit: Payer: Self-pay

## 2022-01-19 ENCOUNTER — Emergency Department (HOSPITAL_COMMUNITY): Payer: 59

## 2022-01-19 ENCOUNTER — Other Ambulatory Visit: Payer: Self-pay

## 2022-01-19 ENCOUNTER — Telehealth: Payer: Self-pay

## 2022-01-19 ENCOUNTER — Emergency Department (HOSPITAL_COMMUNITY)
Admission: EM | Admit: 2022-01-19 | Discharge: 2022-01-19 | Disposition: A | Discharge status: Home / Self Care | Payer: 59 | Attending: Emergency Medicine | Admitting: Emergency Medicine

## 2022-01-19 ENCOUNTER — Encounter (HOSPITAL_COMMUNITY): Payer: Self-pay

## 2022-01-19 DIAGNOSIS — R109 Unspecified abdominal pain: Secondary | ICD-10-CM | POA: Diagnosis present

## 2022-01-19 DIAGNOSIS — K449 Diaphragmatic hernia without obstruction or gangrene: Secondary | ICD-10-CM | POA: Insufficient documentation

## 2022-01-19 DIAGNOSIS — R1012 Left upper quadrant pain: Secondary | ICD-10-CM

## 2022-01-19 LAB — CBC
HCT: 47.3 % — ABNORMAL HIGH (ref 36.0–46.0)
Hemoglobin: 15.6 g/dL — ABNORMAL HIGH (ref 12.0–15.0)
MCH: 30.1 pg (ref 26.0–34.0)
MCHC: 33 g/dL (ref 30.0–36.0)
MCV: 91.1 fL (ref 80.0–100.0)
Platelets: 315 10*3/uL (ref 150–400)
RBC: 5.19 MIL/uL — ABNORMAL HIGH (ref 3.87–5.11)
RDW: 12.6 % (ref 11.5–15.5)
WBC: 7.7 10*3/uL (ref 4.0–10.5)
nRBC: 0 % (ref 0.0–0.2)

## 2022-01-19 LAB — COMPREHENSIVE METABOLIC PANEL
ALT: 17 U/L (ref 0–44)
AST: 22 U/L (ref 15–41)
Albumin: 3.8 g/dL (ref 3.5–5.0)
Alkaline Phosphatase: 81 U/L (ref 38–126)
Anion gap: 11 (ref 5–15)
BUN: 16 mg/dL (ref 6–20)
CO2: 22 mmol/L (ref 22–32)
Calcium: 9.4 mg/dL (ref 8.9–10.3)
Chloride: 108 mmol/L (ref 98–111)
Creatinine, Ser: 0.81 mg/dL (ref 0.44–1.00)
GFR, Estimated: >60 mL/min (ref >60)
Glucose, Bld: 98 mg/dL (ref 70–99)
Potassium: 3.6 mmol/L (ref 3.5–5.1)
Sodium: 141 mmol/L (ref 135–145)
Total Bilirubin: 0.8 mg/dL (ref 0.3–1.2)
Total Protein: 6.6 g/dL (ref 6.5–8.1)

## 2022-01-19 LAB — URINALYSIS, ROUTINE W REFLEX MICROSCOPIC
Bilirubin Urine: NEGATIVE
Glucose, UA: NEGATIVE mg/dL
Hgb urine dipstick: NEGATIVE
Ketones, ur: NEGATIVE mg/dL
Leukocytes,Ua: NEGATIVE
Nitrite: NEGATIVE
Protein, ur: NEGATIVE mg/dL
Specific Gravity, Urine: 1.021 (ref 1.005–1.030)
pH: 5 (ref 5.0–8.0)

## 2022-01-19 LAB — TROPONIN I (HIGH SENSITIVITY)
Troponin I (High Sensitivity): 3 ng/L (ref <18)
Troponin I (High Sensitivity): <2 ng/L (ref <18)

## 2022-01-19 LAB — LIPASE, BLOOD: Lipase: 34 U/L (ref 11–51)

## 2022-01-19 MED ORDER — FAMOTIDINE 40 MG/5ML PO SUSR: 20.0000 mg | Freq: Every day | ORAL | 0 refills | Status: DC

## 2022-01-19 MED ORDER — ALUM & MAG HYDROXIDE-SIMETH 200-200-20 MG/5ML PO SUSP
30.0000 mL | Freq: Once | ORAL | Status: AC
  Administered 2022-01-19: 30 mL via ORAL
  Filled 2022-01-19: qty 30

## 2022-01-19 MED ORDER — IOHEXOL 350 MG/ML SOLN
75.0000 mL | Freq: Once | INTRAVENOUS | Status: AC | PRN
  Administered 2022-01-19: 75 mL via INTRAVENOUS

## 2022-01-19 NOTE — ED Provider Triage Note (Signed)
Emergency Medicine Provider Triage Evaluation Note  Angela Hartman , a 41 y.o. female  was evaluated in triage.  Pt complains of .  Upper abdominal pain.  Was seen at [facility] for pain Friday, negative CT renal study but no contrast ID.  States she has been having upper abdominal pain which is now to the left side of her rib.  Endorses nausea and vomiting, no diarrhea constipation.  History of cholecystectomy and hysterectomy no other abdominal surgeries per the patient.  Review of Systems  Per HPI  Physical Exam  BP 113/80 (BP Location: Right Arm)   Pulse 86   Temp 98.5 F (36.9 C)   Resp (!) 22   Ht '5\' 7"'$  (1.702 m)   Wt 95.3 kg   LMP 04/05/2016 (Exact Date)   SpO2 97%   BMI 32.89 kg/m  Gen:   Awake, no distress   Resp:  Normal effort  MSK:   Moves extremities without difficulty  Other:  Abdomen is soft, epigastric and left upper quadrant abdominal tenderness without rigidity or guarding  Medical Decision Making  Medically screening exam initiated at 5:41 PM.  Appropriate orders placed.  Ricky Doan was informed that the remainder of the evaluation will be completed by another provider, this initial triage assessment does not replace that evaluation, and the importance of remaining in the ED until their evaluation is complete.     Sherrill Raring, PA-C 01/19/22 1742

## 2022-01-19 NOTE — Discharge Instructions (Addendum)
Patient you were seen in the emergency department for your abdominal pain.  Your work-up here showed no new findings to explain your pain and it is likely related to your hiatal hernia and acid reflux.  You should follow-up with your GI doctor as you likely will need further testing to determine the cause of your worsening pain.  You should continue to take your antacid at home and I have given you a second antacid Pepcid that you can take with that and you can take Maalox as needed.  You were also sent Carafate and Bentyl from the ER on Friday which can help with bloating and cramping type of pain and you can take that as needed.  You should return to the emergency department if your pain significantly worsens, you have blood in your stools, you become lightheaded or pass out, you have fevers or if you have any other new or concerning symptoms.

## 2022-01-19 NOTE — ED Provider Notes (Signed)
[facility] EMERGENCY DEPARTMENT Provider Note   CSN: [id number] Arrival date & time: 01/19/22  1649     History  Chief Complaint  Patient presents with   Abdominal Pain    Angela Hartman is a 41 y.o. female.  Patient is a 41 year old female with a past medical history of GERD presenting to the emergency department with abdominal pain.  Patient was initially seen in the [place] ED last Friday for her abdominal pain.  She states that it started on Wednesday and is associated with nausea but denies any vomiting.  She denies any fevers or chills.  She states her pain initially started superior to her umbilicus.  She states that she was told in the ED on Friday that she had a hiatal hernia however she states that since then her pain has been getting worse and is now migrated to the left upper quadrant and radiates to the left side of her back.  She denies any dysuria or hematuria, black or bloody stools, diarrhea or constipation.  She states that she takes an over-the-counter antacid daily but has had no significant relief.  The history is provided by the patient.  Abdominal Pain      Home Medications Prior to Admission medications   Medication Sig Start Date End Date Taking? Authorizing Provider  famotidine (PEPCID) 40 MG/5ML suspension Take 2.5 mLs (20 mg total) by mouth daily. 01/19/22 02/18/22 Yes Kingsley, Coyt Govoni K, DO  NEXIUM 24HR 20 MG capsule Take 40 mg by mouth daily before breakfast.   Yes [provider]  TYLENOL DISSOLVE PACKS 500 MG PACK Take 500-1,000 mg by mouth 2 (two) times daily as needed (for headaches).   Yes [provider]  dicyclomine (BENTYL) 20 MG tablet Take 1 tablet (20 mg total) by mouth 2 (two) times daily. Patient not taking: Reported on 01/19/2022 01/15/22   Palumbo, April, MD  meloxicam (MOBIC) 15 MG tablet Take 1 tablet (15 mg total) by mouth daily. Patient not taking: Reported on 01/19/2022 08/17/21    Hoyt Koch, MD  omeprazole (PRILOSEC) 40 MG capsule Take 1 capsule (40 mg total) by mouth 2 (two) times daily at 8 am and 10 pm. Patient not taking: Reported on 01/19/2022 01/13/21   Esterwood, Amy S, PA-C  sucralfate (CARAFATE) 1 GM/10ML suspension Take 10 mLs (1 g total) by mouth 4 (four) times daily -  with meals and at bedtime. Patient not taking: Reported on 01/19/2022 01/15/22   Randal Buba, April, MD      Allergies    Other, Penicillins, and Tape    Review of Systems   Review of Systems  Gastrointestinal:  Positive for abdominal pain.    Physical Exam Updated Vital Signs BP 119/85   Pulse 75   Temp 98.5 F (36.9 C)   Resp 18   Ht '5\' 7"'$  (1.702 m)   Wt 95.3 kg   LMP 04/05/2016 (Exact Date)   SpO2 96%   BMI 32.89 kg/m  Physical Exam Vitals and nursing note reviewed.  Constitutional:      General: She is not in acute distress.    Appearance: She is well-developed.  HENT:     Head: Normocephalic and atraumatic.     Mouth/Throat:     Pharynx: Oropharynx is clear.  Eyes:     Extraocular Movements: Extraocular movements intact.  Cardiovascular:     Rate and Rhythm: Normal rate and regular rhythm.     Heart sounds: Normal heart sounds.  Pulmonary:  Effort: Pulmonary effort is normal.  Abdominal:     General: Abdomen is flat.     Palpations: Abdomen is soft.     Tenderness: There is abdominal tenderness (minimal) in the epigastric area and left upper quadrant. There is no right CVA tenderness, left CVA tenderness, guarding or rebound.  Skin:    General: Skin is warm and dry.  Neurological:     General: No focal deficit present.     Mental Status: She is alert and oriented to person, place, and time.  Psychiatric:        Mood and Affect: Mood normal.        Behavior: Behavior normal.     ED Results / Procedures / Treatments   Labs (all labs ordered are listed, but only abnormal results are displayed) Labs Reviewed  CBC - Abnormal; Notable for the  following components:      Result Value   RBC 5.19 (*)    Hemoglobin 15.6 (*)    HCT 47.3 (*)    All other components within normal limits  URINALYSIS, ROUTINE W REFLEX MICROSCOPIC - Abnormal; Notable for the following components:   APPearance CLOUDY (*)    All other components within normal limits  LIPASE, BLOOD  COMPREHENSIVE METABOLIC PANEL  TROPONIN I (HIGH SENSITIVITY)  TROPONIN I (HIGH SENSITIVITY)    EKG EKG Interpretation  Date/Time:  Wednesday January 19 2022 17:08:41 EST Ventricular Rate:  74 PR Interval:  148 QRS Duration: 86 QT Interval:  376 QTC Calculation: 417 R Axis:   136 Text Interpretation: Normal sinus rhythm Left posterior fascicular block Abnormal ECG No significant change since last tracing Confirmed by Leanord Asal ([id number]) on 01/19/2022 6:36:28 PM  Radiology CT Abdomen Pelvis W Contrast  Result Date: 01/19/2022 CLINICAL DATA:  Left lower quadrant abdominal pain EXAM: CT ABDOMEN AND PELVIS WITH CONTRAST TECHNIQUE: Multidetector CT imaging of the abdomen and pelvis was performed using the standard protocol following bolus administration of intravenous contrast. RADIATION DOSE REDUCTION: This exam was performed according to the departmental dose-optimization program which includes automated exposure control, adjustment of the mA and/or kV according to patient size and/or use of iterative reconstruction technique. CONTRAST:  15m OMNIPAQUE IOHEXOL 350 MG/ML SOLN COMPARISON:  None Available. FINDINGS: Lower chest: No acute abnormality.  Small hiatal hernia. Hepatobiliary: No focal liver abnormality is seen. Status post cholecystectomy. No biliary dilatation. Pancreas: Unremarkable Spleen: Unremarkable Adrenals/Urinary Tract: Adrenal glands are unremarkable. Kidneys are normal, without renal calculi, focal lesion, or hydronephrosis. Bladder is unremarkable. Stomach/Bowel: Stomach is within normal limits. Appendix appears normal. No evidence of bowel wall  thickening, distention, or inflammatory changes. Vascular/Lymphatic: Mild atherosclerotic calcification within the abdominal aorta. No aortic aneurysm. No pathologic adenopathy within the abdomen and pelvis. Reproductive: Status post hysterectomy. No adnexal masses. Other: Small fat containing umbilical hernia. Musculoskeletal: No acute bone abnormality. No lytic or blastic bone lesion. IMPRESSION: 1. No acute intra-abdominal pathology identified. No definite radiographic explanation for the patient's reported symptoms. 2. Small hiatal hernia. 3.  Aortic Atherosclerosis (ICD10-I70.0). Electronically Signed   By: AFidela SalisburyM.D.   On: 01/19/2022 18:53    Procedures Procedures    Medications Ordered in ED Medications  alum & mag hydroxide-simeth (MAALOX/MYLANTA) 200-200-20 MG/5ML suspension 30 mL (has no administration in time range)  iohexol (OMNIPAQUE) 350 MG/ML injection 75 mL (75 mLs Intravenous Contrast Given 01/19/22 1838)    ED Course/ Medical Decision Making/ A&P Clinical Course as of 01/19/22 2118  Wed Jan 19, 2022  2101  Repeat troponin negative. Patient stable for discharge with GI and general surgery follow up. [VK]    Clinical Course User Index [VK] Kemper Durie, DO                           Medical Decision Making This patient presents to the ED with chief complaint(s) of abdominal pain with pertinent past medical history of GERD, s/p cholecystectomy which further complicates the presenting complaint. The complaint involves an extensive differential diagnosis and also carries with it a high risk of complications and morbidity.    The differential diagnosis includes gastritis, GERD, atypical ACS, peptic ulcer, anemia, no urinary symptoms making pyelonephritis or nephrolithiasis less likely, pancreatitis  Additional history obtained: Additional history obtained from N/A Records reviewed Primary Care Documents, GI records, recent ED records  ED Course and  Reassessment: Patient was initially evaluated by provider in triage and had EKG, labs and a CT abdomen and pelvis performed to evaluate for cause of her symptoms.  EKG is nonischemic.  CT is pending read.  Her labs are otherwise pending at this time.  The patient states that her pain is only mild at this time and declines any pain medication.  Independent labs interpretation:  The following labs were independently interpreted: Within normal range  Independent visualization of imaging: - I independently visualized the following imaging with scope of interpretation limited to determining acute life threatening conditions related to emergency care: CT AP, which revealed hiatal hernia though no acute findings to explain her pain  Consultation: - Consulted or discussed management/test interpretation w/ external professional: N/A  Consideration for admission or further workup: Patient has no emergent conditions requiring admission or further work-up at this time and is stable for discharge home with GI follow-up  Social Determinants of health: N/A    Amount and/or Complexity of Data Reviewed Labs: ordered.  Risk OTC drugs. Prescription drug management.          Final Clinical Impression(s) / ED Diagnoses Final diagnoses:  Hiatal hernia  Left upper quadrant abdominal pain    Rx / DC Orders ED Discharge Orders          Ordered    famotidine (PEPCID) 40 MG/5ML suspension  Daily        01/19/22 2114              Kemper Durie, DO 01/19/22 2118

## 2022-01-19 NOTE — ED Triage Notes (Signed)
Reports dx with hernia at [facility] on Friday.  But since pain is now more in the left lower quad.  Reports when she walks it takes her breathe.  No n/v at this time and normal BM.

## 2022-01-19 NOTE — Telephone Encounter (Signed)
Received message to call patient about TOC to our office. No answer, not able to leave vmail. Will try calling again tomorrow.

## 2022-01-19 NOTE — Telephone Encounter (Signed)
  Chief Complaint: left upper quadrant abd pain severe in nature Symptoms: severe sharp and dull pain, cold sweats Frequency: 2 weeks Pertinent Negatives: Patient denies vomiting, diarrhea Disposition: '[x]'$ ED /'[]'$ Urgent Care (no appt availability in office) / '[]'$ Appointment(In office/virtual)/ '[]'$  [facility] [facility]/ '[]'$ Home Care/ '[]'$ Refused Recommended Disposition /'[]'$ [facility]/ '[]'$  Follow-up with PCP Additional Notes: to ED Reason for Disposition  [1] SEVERE pain (e.g., excruciating) AND [2] present > 1 hour  Answer Assessment - Initial Assessment Questions 1. LOCATION: "Where does it hurt?"      Left upper abdomen under ribcage 2. RADIATION: "Does the pain shoot anywhere else?" (e.g., chest, back)     N/a 3. ONSET: "When did the pain begin?" (e.g., minutes, hours or days ago)       H/o abd pain 2 weeks but the area of discomfort has moved to a different location 4. SUDDEN: "Gradual or sudden onset?"     N/a 5. PATTERN "Does the pain come and go, or is it constant?"    - If it comes and goes: "How long does it last?" "Do you have pain now?"     (Note: Comes and goes means the pain is intermittent. It goes away completely between bouts.)    - If constant: "Is it getting better, staying the same, or getting worse?"      (Note: Constant means the pain never goes away completely; most serious pain is constant and gets worse.)      constant 6. SEVERITY: "How bad is the pain?"  (e.g., Scale 1-10; mild, moderate, or severe)    - MILD (1-3): Doesn't interfere with normal activities, abdomen soft and not tender to touch.     - MODERATE (4-7): Interferes with normal activities or awakens from sleep, abdomen tender to touch.     - SEVERE (8-10): Excruciating pain, doubled over, unable to do any normal activities.       Severe sharp and dull 7. RECURRENT SYMPTOM: "Have you ever had this type of stomach pain before?" If Yes, ask: "When was the last time?" and "What happened that time?"       Nothing like this pain 8. CAUSE: "What do you think is causing the stomach pain?"     unsure 9. RELIEVING/AGGRAVATING FACTORS: "What makes it better or worse?" (e.g., antacids, bending or twisting motion, bowel movement)     N/a 10. OTHER SYMPTOMS: "Do you have any other symptoms?" (e.g., back pain, diarrhea, fever, urination pain, vomiting)       Sweating, cold sweats 11. PREGNANCY: "Is there any chance you are pregnant?" "When was your last menstrual period?"       N/a  Protocols used: Abdominal Pain - [facility]

## 2022-01-19 NOTE — ED Notes (Signed)
Patient transported to CT 

## 2022-01-20 ENCOUNTER — Telehealth: Payer: Self-pay

## 2022-01-20 NOTE — Telephone Encounter (Signed)
Patient is requesting transfer of care to Dr. Sharlet Salina since [name] has relocated. Please advise.

## 2022-01-20 NOTE — Telephone Encounter (Signed)
Transition Care Management Unsuccessful Follow-up Telephone Call  Date of discharge and from where:  [facility] 01/19/2022  Attempts:  1st Attempt  Reason for unsuccessful TCM follow-up call:  Left voice message Juanda Crumble, LPN [facility] Nurse Health Advisor Direct Dial [place], [place] Direct Dial [phone]

## 2022-01-21 ENCOUNTER — Telehealth: Payer: Self-pay | Admitting: Physician Assistant

## 2022-01-21 NOTE — Telephone Encounter (Signed)
Transition Care Management Follow-up Telephone Call Date of discharge and from where: [facility] 01/19/2022 How have you been since you were released from the hospital? better Any questions or concerns? No  Items Reviewed: Did the pt receive and understand the discharge instructions provided? Yes  Medications obtained and verified? Yes  Other? No  Any new allergies since your discharge? No  Dietary orders reviewed? Yes Do you have support at home? Yes   Home Care and Equipment/Supplies: Were home health services ordered? no If so, what is the name of the agency? N/a  Has the agency set up a time to come to the patient's home? not applicable Were any new equipment or medical supplies ordered?  No What is the name of the medical supply agency? N/a Were you able to get the supplies/equipment? not applicable Do you have any questions related to the use of the equipment or supplies? No  Functional Questionnaire: (I = Independent and D = Dependent) ADLs: I  Bathing/Dressing- I  Meal Prep- I  Eating- I  Maintaining continence- I  Transferring/Ambulation- I  Managing Meds- I  Follow up appointments reviewed:  PCP Hospital f/u appt confirmed? No  Patient declined appt [facility] f/u appt confirmed? No  Are transportation arrangements needed? No  If their condition worsens, is the pt aware to call PCP or go to the Emergency Dept.? Yes Was the patient provided with contact information for the PCP's office or ED? Yes Was to pt encouraged to call back with questions or concerns? Yes Juanda Crumble, LPN [place] Direct Dial [phone]

## 2022-01-21 NOTE — Telephone Encounter (Signed)
Please see note I have already advised no on this request 08/23/21

## 2022-01-21 NOTE — Telephone Encounter (Signed)
Inbound call from patient stating that she was seen at [facility] ER on 11/29 and was told to follow up with Amy Esterwood in two days. Patient stated that she is currently scheduled to see Alonza Bogus on 1/3 but is wanting to see if she can be seen sooner. Please advise.

## 2022-01-24 NOTE — Progress Notes (Deleted)
'/;] ';./"      01/24/2022 Bryauna Byrum [id number] 01/14/1981  Referring provider: No ref. provider found Primary GI doctor: Dr. Silverio Decamp  ASSESSMENT AND PLAN:   There are no diagnoses linked to this encounter.   History of Present Illness:  41 y.o. female with a past medical history of GERD, status post cholecystectomy, hysterectomy, large hiatal hernia with history of grade C esophagitis, AVM and others listed below, presents for ER follow up on 01/19/22 for abdominal pain, nausea without vomiting.  03/2019 EGD grade C esophagitis and a large hiatal hernia measuring about 6 cm, she was noted to have a single 5 mm AVM in the gastric body non bleeding.  Patient went to the ER 11/25 as well as 11/29 for abdominal pain.  Given Bentyl and Carafate. Unremarkable CT, labs unremarkable.  Previous history of meloxicam daily. Troponin negative, urine normal, hemoglobin 15.6, no leukocytosis, normal platelets.  Normal kidney and liver.  Negative lipase 01/19/2022 CT abdomen pelvis with contrast for left lower quadrant abdominal pain showed small daughter at hiatal hernia, no acute intra-abdominal pathology.  She  reports that she has been smoking cigarettes. She has a 8.00 pack-year smoking history. She has never used smokeless tobacco. She reports that she does not drink alcohol and does not use drugs. Her family history includes Heart attack in her father.   Current Medications:      Current Outpatient Medications (Analgesics):    meloxicam (MOBIC) 15 MG tablet, Take 1 tablet (15 mg total) by mouth daily. (Patient not taking: Reported on 01/19/2022)   TYLENOL DISSOLVE PACKS 500 MG PACK, Take 500-1,000 mg by mouth 2 (two) times daily as needed (for headaches).   Current Outpatient Medications (Other):    dicyclomine (BENTYL) 20 MG tablet, Take 1 tablet (20 mg total) by mouth 2 (two) times daily. (Patient not taking: Reported on 01/19/2022)   famotidine (PEPCID) 40 MG/5ML  suspension, Take 2.5 mLs (20 mg total) by mouth daily.   NEXIUM 24HR 20 MG capsule, Take 40 mg by mouth daily before breakfast.   omeprazole (PRILOSEC) 40 MG capsule, Take 1 capsule (40 mg total) by mouth 2 (two) times daily at 8 am and 10 pm. (Patient not taking: Reported on 01/19/2022)   sucralfate (CARAFATE) 1 GM/10ML suspension, Take 10 mLs (1 g total) by mouth 4 (four) times daily -  with meals and at bedtime. (Patient not taking: Reported on 01/19/2022)  Surgical History:  She  has a past surgical history that includes Cholecystectomy; Wisdom tooth extraction; Robotic assisted total hysterectomy with bilateral salpingo oophorectomy (N/A, 04/20/2016); Cystoscopy (N/A, 04/20/2016); Cystocele repair (N/A, 05/06/2016); Cystoscopy (05/06/2016); and Upper gastrointestinal endoscopy.  Current Medications, Allergies, Past Medical History, Past Surgical History, Family History and Social History were reviewed in [employer] record.  Physical Exam: LMP 04/05/2016 (Exact Date)  General:   Pleasant, well developed female in no acute distress Heart : Regular rate and rhythm; no murmurs Pulm: Clear anteriorly; no wheezing Abdomen:  {BlankSingle:19197::"Distended","Ridged","Soft"}, {BlankSingle:19197::"Flat","Obese","Non-distended"} AB, {BlankSingle:19197::"Absent","Hyperactive, tinkling","Hypoactive","Sluggish","Active"} bowel sounds. {actendernessAB:27319} tenderness {anatomy; site abdomen:5010}. {BlankMultiple:19196::"Without guarding","With guarding","Without rebound","With rebound"}, No organomegaly appreciated. Rectal: {acrectalexam:27461} Extremities:  {With/without:5700}  edema. Neurologic:  Alert and  oriented x4;  No focal deficits.  Psych:  Cooperative. Normal mood and affect.   Vladimir Crofts, PA-C 01/24/22

## 2022-01-26 ENCOUNTER — Ambulatory Visit: Payer: 59 | Admitting: Physician Assistant

## 2022-02-16 ENCOUNTER — Ambulatory Visit: Payer: 59 | Admitting: Internal Medicine

## 2022-02-23 ENCOUNTER — Ambulatory Visit: Payer: 59 | Admitting: Gastroenterology

## 2022-02-23 ENCOUNTER — Encounter: Payer: Self-pay | Admitting: Gastroenterology

## 2022-02-23 ENCOUNTER — Telehealth: Payer: Self-pay | Admitting: Pharmacy Technician

## 2022-02-23 ENCOUNTER — Other Ambulatory Visit (HOSPITAL_COMMUNITY): Payer: Self-pay

## 2022-02-23 VITALS — BP 128/76 | HR 64 | Ht 67.0 in | Wt 218.0 lb

## 2022-02-23 DIAGNOSIS — K219 Gastro-esophageal reflux disease without esophagitis: Secondary | ICD-10-CM

## 2022-02-23 DIAGNOSIS — K2100 Gastro-esophageal reflux disease with esophagitis, without bleeding: Secondary | ICD-10-CM

## 2022-02-23 DIAGNOSIS — R112 Nausea with vomiting, unspecified: Secondary | ICD-10-CM

## 2022-02-23 HISTORY — DX: Gastro-esophageal reflux disease without esophagitis: K21.9

## 2022-02-23 MED ORDER — SUCRALFATE 1 G PO TABS: 1.0000 g | ORAL_TABLET | Freq: Three times a day (TID) | ORAL | 1 refills | Status: DC

## 2022-02-23 MED ORDER — LANSOPRAZOLE 30 MG PO TBDD: 30.0000 mg | DELAYED_RELEASE_TABLET | Freq: Two times a day (BID) | ORAL | 5 refills | Status: DC

## 2022-02-23 NOTE — Progress Notes (Signed)
02/23/2022 Angela Hartman [id number] 1980/12/19   HISTORY OF PRESENT ILLNESS: This is a 42 year old female who is a patient Dr. Woodward Ku.  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  It appears that previously she was on OTC omeprazole.  At her last visit here in November 2022 she was placed on omeprazole 40 mg twice daily via prescription, but does not recall taking that and is still only on 40 mg OTC daily at this time.  She recently went to the ED because of upper abdominal pain.  CT scan was unremarkable for any acute issues.  She had her previously noted hiatal hernia.  She was given Pepcid suspension and Carafate suspension as well as Bentyl.  She says that she cannot swallow pills.  She said that she did not pick up the Bentyl because she is not having crampy abdominal pain.  Carafate was too expensive so she did not pick that up either.  She did get the Pepcid and took that for a few days.  Continues to report a lot of acid reflux with an acidic taste in her mouth.  Says that her symptoms of reflux can be bad enough that she will vomit.   Past Medical History:  Diagnosis Date   Abnormal Pap smear of cervix    has had several   Anemia    Anxiety    Chest pain 2018   Went to ER, EKG normal, due to anxiety per patient   GERD (gastroesophageal reflux disease)    Headache    migraines   Past Surgical History:  Procedure Laterality Date   CHOLECYSTECTOMY     CYSTOCELE REPAIR N/A 05/06/2016   Procedure: Repair Vaginal Cuff Dehisance,;  Surgeon: Bobbye Charleston, MD;  Location: [facility];  Service: Gynecology;  Laterality: N/A;   CYSTOSCOPY N/A 04/20/2016   Procedure: CYSTOSCOPY;  Surgeon: Bobbye Charleston, MD;  Location: [facility];  Service: Gynecology;  Laterality: N/A;    CYSTOSCOPY  05/06/2016   Procedure: CYSTOSCOPY;  Surgeon: Bobbye Charleston, MD;  Location: [facility];  Service: Gynecology;;   ROBOTIC ASSISTED TOTAL HYSTERECTOMY WITH BILATERAL SALPINGO OOPHERECTOMY N/A 04/20/2016   Procedure: ROBOTIC ASSISTED TOTAL HYSTERECTOMY WITH BILATERAL SALPINGO OOPHORECTOMY;  Surgeon: Bobbye Charleston, MD;  Location: [facility];  Service: Gynecology;  Laterality: N/A;   UPPER GASTROINTESTINAL ENDOSCOPY     WISDOM TOOTH EXTRACTION      reports that she has been smoking cigarettes. She has a 8.00 pack-year smoking history. She has never used smokeless tobacco. She reports that she does not drink alcohol and does not use drugs. family history includes Heart attack in her father. Allergies  Allergen Reactions   Other Other (See Comments)    CAN ONLY TAKE LIQUID MEDICATION- NO PILLS   Penicillins Hives and Other (See Comments)    Has patient had a PCN reaction causing immediate rash, facial/tongue/throat swelling, SOB or lightheadedness with hypotension: Yes  Has patient had a PCN reaction causing severe rash involving mucus membranes or skin necrosis: No  Has patient had a PCN reaction that required hospitalization: No  Has patient had a PCN reaction occurring within the last 10 years: No  If all of the above answers are "NO", then may proceed with  Cephalosporin use.   Tape Rash      Outpatient Encounter Medications as of 02/23/2022  Medication Sig   famotidine (PEPCID) 40 MG/5ML suspension Take 2.5 mLs (20 mg total) by mouth daily.   NEXIUM 24HR 20 MG capsule Take 40 mg by mouth daily before breakfast.   omeprazole (PRILOSEC) 40 MG capsule Take 1 capsule (40 mg total) by mouth 2 (two) times daily at 8 am and 10 pm.   TYLENOL DISSOLVE PACKS 500 MG PACK Take 500-1,000 mg by mouth 2 (two) times daily as needed (for headaches).   [DISCONTINUED] dicyclomine (BENTYL) 20 MG tablet Take 1 tablet (20 mg total) by mouth 2 (two) times daily. (Patient not taking: Reported on  01/19/2022)   [DISCONTINUED] meloxicam (MOBIC) 15 MG tablet Take 1 tablet (15 mg total) by mouth daily. (Patient not taking: Reported on 01/19/2022)   [DISCONTINUED] sucralfate (CARAFATE) 1 GM/10ML suspension Take 10 mLs (1 g total) by mouth 4 (four) times daily -  with meals and at bedtime. (Patient not taking: Reported on 01/19/2022)   No facility-administered encounter medications on file as of 02/23/2022.     REVIEW OF SYSTEMS  : All other systems reviewed and negative except where noted in the History of Present Illness.   PHYSICAL EXAM: BP 128/76   Pulse 64   Ht '5\' 7"'$  (1.702 m)   Wt 218 lb (98.9 kg)   LMP 04/05/2016 (Exact Date)   BMI 34.14 kg/m  General: Well developed white female in no acute distress Head: Normocephalic and atraumatic Eyes:  Sclerae anicteric, conjunctiva pink. Ears: Normal auditory acuity Lungs: Clear throughout to auscultation; no W/R/R. Heart: Regular rate and rhythm; no M/R/G. Abdomen: Soft, non-distended.  BS present.  Non-tender. Musculoskeletal: Symmetrical with no gross deformities  Skin: No lesions on visible extremities Extremities: No edema  Neurological: Alert oriented x 4, grossly non-focal Psychological:  Alert and cooperative. 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  Has complaints of ongoing nausea, reflux, episodes of vomiting, and upper abdominal pain.  She cannot swallow pills.  She has been opening the omeprazole capsules OTC and only using equivalent to 40 mg once daily.  Was prescribed Pepcid liquid and Carafate suspension in the ED recently, but the Carafate was expensive.  I am going to have her discontinue the omeprazole capsules and see if we can get Prevacid SoluTab 30 mg twice daily.  I will have her do the Pepcid suspension that she has at bedtime.  Will prescribe Carafate tablet and have her make  it into her own slurry ACHS for the next 1 to 2 months.  Prescriptions sent to pharmacy.  Will then plan for repeat EGD in mid to end of February for reassessment.  EGD scheduled with Dr. Silverio Decamp.  The risks, benefits, and alternatives to EGD were discussed with the patient and she consents to proceed.    CC:  Marrian Salvage,*

## 2022-02-23 NOTE — Patient Instructions (Signed)
_______________________________________________________  If you are age 44 or older, your body mass index should be between 23-30. Your Body mass index is 34.14 kg/m. If this is out of the aforementioned range listed, please consider follow up with your Primary Care Provider.  If you are age 35 or younger, your body mass index should be between 19-25. Your Body mass index is 34.14 kg/m. If this is out of the aformentioned range listed, please consider follow up with your Primary Care Provider.   ________________________________________________________  The [facility] providers would like to encourage you to use [facility] to communicate with providers for non-urgent requests or questions.  Due to long hold times on the telephone, sending your provider a message by [facility] may be a faster and more efficient way to get a response.  Please allow 48 business hours for a response.  Please remember that this is for non-urgent requests.  _______________________________________________________   We have sent the following medications to your pharmacy for you to pick up at your convenience: Prevacid 30 mg twice daily   Carafate Slurry 1 g tablet slowly dissolve in 1 tablespoon of distilled water 20-30 min before meals and at bedtime. DO NOT CRUSH.  Continue Pepcid daily at bedtime  You have been scheduled for an endoscopy. Please follow written instructions given to you at your visit today. If you use inhalers (even only as needed), please bring them with you on the day of your procedure.

## 2022-02-23 NOTE — Telephone Encounter (Signed)
Patient Advocate Encounter  Received notification from [employer] that prior authorization for LANSOPRAZOLE '30MG'$  is required.   PA submitted on 1.3.24 Key [id number]  Status is pending

## 2022-02-24 NOTE — Telephone Encounter (Signed)
Patient Advocate Encounter Received notification that PA was partially approved for 30 tablets per day.  Resubmitted PA with added chart notes to show that patient does have Gastroesophageal reflux disease. PA does not ask for diagnosis in any of the questions. Added GERD in the place for directions as well as attached chart notes.    PA submitted on 1.4.24 Key [id number] Status is pending

## 2022-03-11 ENCOUNTER — Ambulatory Visit: Payer: 59 | Admitting: Family Medicine

## 2022-03-11 ENCOUNTER — Encounter: Payer: Self-pay | Admitting: Family Medicine

## 2022-03-11 VITALS — BP 118/76 | HR 88 | Temp 97.7°F | Ht 66.0 in | Wt 217.4 lb

## 2022-03-11 DIAGNOSIS — K219 Gastro-esophageal reflux disease without esophagitis: Secondary | ICD-10-CM

## 2022-03-11 DIAGNOSIS — IMO0001 Reserved for inherently not codable concepts without codable children: Secondary | ICD-10-CM

## 2022-03-11 DIAGNOSIS — Z6835 Body mass index (BMI) 35.0-35.9, adult: Secondary | ICD-10-CM

## 2022-03-11 DIAGNOSIS — M722 Plantar fascial fibromatosis: Secondary | ICD-10-CM | POA: Diagnosis not present

## 2022-03-11 DIAGNOSIS — F17200 Nicotine dependence, unspecified, uncomplicated: Secondary | ICD-10-CM

## 2022-03-11 DIAGNOSIS — F17210 Nicotine dependence, cigarettes, uncomplicated: Secondary | ICD-10-CM

## 2022-03-11 MED ORDER — ZEPBOUND 2.5 MG/0.5ML ~~LOC~~ SOAJ: 2.5000 mg | SUBCUTANEOUS | 0 refills | Status: DC

## 2022-03-11 MED ORDER — NICOTINE 10 MG IN INHA: 1.0000 | RESPIRATORY_TRACT | 3 refills | Status: DC | PRN

## 2022-03-11 NOTE — Patient Instructions (Addendum)
It was very nice to see you today!  Voltaren gel, stretches, arch support shoes.  Theragun, golf ball or laCrosse ball rolling it.    See sports medicine [facility]  [address] on the 1st floor Phone number [phone]   Nicotine-gum, patches, possibly nicotrol inhaler(national shortage).   Miralax daily, colace '100mg'$  daily. Water, tummy massage   PLEASE NOTE:  If you had any lab tests please let us know if you have not heard back within a few days. You may see your results on MyChart before we have a chance to review them but we will give you a call once they are reviewed by [place]. If we ordered any referrals today, please let us know if you have not heard from their office within the next week.   Please try these tips to maintain a healthy lifestyle:  Eat most of your calories during the day when you are active. Eliminate processed foods including packaged sweets (pies, cakes, cookies), reduce intake of potatoes, white bread, white pasta, and white rice. Look for whole grain options, oat flour or almond flour.  Each meal should contain half fruits/vegetables, one quarter protein, and one quarter carbs (no bigger than a computer mouse).  Cut down on sweet beverages. This includes juice, soda, and sweet tea. Also watch fruit intake, though this is a healthier sweet option, it still contains natural sugar! Limit to 3 servings daily.  Drink at least 1 glass of water with each meal and aim for at least 8 glasses per day  Exercise at least 150 minutes every week.

## 2022-03-11 NOTE — Progress Notes (Signed)
Subjective:     Patient ID: Angela Hartman, female    DOB: [birth year], 42 y.o.   MRN: [id number]  Chief Complaint  Patient presents with   Obesity    Would like help quitting smoking-legs and heals ache al the time    HPI TOC  Smoker-wants to quit-no meds ever.  Only tried to just quit.  Was successful quitting while pregnant only. Trouble swallowing pills.  Has tried putting in pudding, etc.  GERD-ulcers-getting EGD on 03/14/22. B heel pain for >4mo  No injury.  Feels bruised-tried brace off internet for 1 mo.  Husband massages.  Tender to touch. Pain to get up and walk.  Obesity-keeps gaining.  Trying to work on diet-decreased soda, more water.  No exercise.   Health Maintenance Due  Topic Date Due   COVID-19 Vaccine (1) Never done   HIV Screening  Never done   Hepatitis C Screening  Never done   DTaP/Tdap/Td (1 - Tdap) Never done   PAP SMEAR-Modifier  01/20/2018    Past Medical History:  Diagnosis Date   Abnormal Pap smear of cervix    has had several   Anemia    Anxiety    Chest pain 2018   Went to ER, EKG normal, due to anxiety per patient   GERD (gastroesophageal reflux disease)    Headache    migraines   Ulcer     Past Surgical History:  Procedure Laterality Date   ABDOMINAL HYSTERECTOMY     CHOLECYSTECTOMY     CYSTOCELE REPAIR N/A 05/06/2016   Procedure: Repair Vaginal Cuff Dehisance,;  Surgeon: MBobbye Charleston MD;  [facility];  Service: Gynecology;  Laterality: N/A;   CYSTOSCOPY N/A 04/20/2016   Procedure: CYSTOSCOPY;  Surgeon: MBobbye Charleston MD;  [facility];  Service: Gynecology;  Laterality: N/A;   CYSTOSCOPY  05/06/2016   Procedure: CYSTOSCOPY;  Surgeon: MBobbye Charleston MD;  [facility];  Service: Gynecology;;   ROBOTIC ASSISTED TOTAL HYSTERECTOMY WITH BILATERAL SALPINGO OOPHERECTOMY N/A 04/20/2016   Procedure: ROBOTIC ASSISTED TOTAL HYSTERECTOMY WITH BILATERAL SALPINGO OOPHORECTOMY;  Surgeon: MBobbye Charleston MD;   [facility];  Service: Gynecology;  Laterality: N/A;   UPPER GASTROINTESTINAL ENDOSCOPY     WISDOM TOOTH EXTRACTION      Outpatient Medications Prior to Visit  Medication Sig Dispense Refill   lansoprazole (PREVACID SOLUTAB) 30 MG disintegrating tablet Take 1 tablet (30 mg total) by mouth 2 (two) times daily. 60 tablet 5   sucralfate (CARAFATE) 1 g tablet Take 1 tablet (1 g total) by mouth 4 (four) times daily -  with meals and at bedtime. 120 tablet 1   TYLENOL DISSOLVE PACKS 500 MG PACK Take 500-1,000 mg by mouth 2 (two) times daily as needed (for headaches).     famotidine (PEPCID) 40 MG/5ML suspension Take 2.5 mLs (20 mg total) by mouth daily. 75 mL 0   No facility-administered medications prior to visit.    Allergies  Allergen Reactions   Other Other (See Comments)    CAN ONLY TAKE LIQUID MEDICATION- NO PILLS   Penicillins Hives and Other (See Comments)    Has patient had a PCN reaction causing immediate rash, facial/tongue/throat swelling, SOB or lightheadedness with hypotension: Yes  Has patient had a PCN reaction causing severe rash involving mucus membranes or skin necrosis: No  Has patient had a PCN reaction that required hospitalization: No  Has patient had a PCN reaction occurring within the last 10 years: No  If all  of the above answers are "NO", then may proceed with Cephalosporin use.   Tape Rash   ROS neg/noncontributory except as noted HPI/below Occ cp,  occ const.  No fh MTC, no depression,anx, SI.       Objective:     BP 118/76   Pulse 88   Temp 97.7 F (36.5 C)   Ht '5\' 6"'$  (1.676 m)   Wt 217 lb 6.4 oz (98.6 kg)   LMP 04/05/2016 (Exact Date)   SpO2 96%   BMI 35.09 kg/m  Wt Readings from Last 3 Encounters:  03/11/22 217 lb 6.4 oz (98.6 kg)  02/23/22 218 lb (98.9 kg)  01/19/22 210 lb (95.3 kg)    Physical Exam   Gen: WDWN NAD owf HEENT: NCAT, conjunctiva not injected, sclera nonicteric NECK:  supple, no thyromegaly, no nodes, no carotid  bruits CARDIAC: RRR, S1S2+, no murmur. DP 2+B LUNGS: CTAB. No wheezes ABDOMEN:  BS+, soft, NTND, No HSM, no masses EXT:  no edema MSK: tender bottom of heel and laterally(achilles insertion).  NEURO: A&O x3.  CN II-XII intact.  PSYCH: normal mood. Good eye contact     Assessment & Plan:   Problem List Items Addressed This Visit       Digestive   Gastroesophageal reflux disease without esophagitis     Other   Smoking - Primary   Other Visit Diagnoses     Class 2 severe obesity with serious comorbidity and body mass index (BMI) of 35.0 to 35.9 in adult, unspecified obesity type (HCC)       Relevant Medications   tirzepatide (ZEPBOUND) 2.5 MG/0.5ML Pen   Plantar fascial fibromatosis of both feet         1.  Smoker-patient would like to quit.  She cannot swallow pills.  Discussed gum/patches.  We discussed Nicotrol inhaler.  She would like to try this, however I discussed that there may be a Producer, television/film/video.  Will send prescription but if not covered, then she can choose gum or patches. 2.  Bilateral plantar fasciitis-chronic.  Advised to use diclofenac gel.  Do calf stretches.  Roll on a lacrosse ball or golf ball.  Good arch support shoes.  May even use a Thera gun.  If things are worsening, not improving-see sports medicine.  Work on losing weight. 3.  GERD chronic.  Not well-controlled.  She is seeing GI and will be getting an EGD soon. 4.  Obesity.  We discussed need for proper diet and exercise.  Diet is somewhat limited by heel pain.  Try to find something she can do.  She does have access to the gym at work.  Will prescribe the bone 2.5 mg subcu weekly.  Side effects discussed.  She cannot swallow pills so all of the other modalities are out.  Follow-up in 3 months.  There is a Producer, television/film/video on Wegovy and Saxenda, so these are unavailable currently  Meds ordered this encounter  Medications   nicotine (NICOTROL) 10 MG inhaler    Sig: Inhale 1 Cartridge (1 continuous  puffing total) into the lungs as needed for smoking cessation.    Dispense:  42 each    Refill:  3   tirzepatide (ZEPBOUND) 2.5 MG/0.5ML Pen    Sig: Inject 2.5 mg into the skin once a week.    Dispense:  2 mL    Refill:  0    Wellington Hampshire, MD

## 2022-03-15 ENCOUNTER — Encounter: Payer: Self-pay | Admitting: Family Medicine

## 2022-03-18 ENCOUNTER — Other Ambulatory Visit: Payer: Self-pay | Admitting: Family Medicine

## 2022-03-18 MED ORDER — WEGOVY 0.25 MG/0.5ML ~~LOC~~ SOAJ: 0.2500 mg | SUBCUTANEOUS | 0 refills | Status: DC

## 2022-03-23 ENCOUNTER — Other Ambulatory Visit: Payer: Self-pay | Admitting: Family Medicine

## 2022-03-23 MED ORDER — NICOTROL NS 10 MG/ML NA SOLN: 1.0000 mg | NASAL | 1 refills | Status: DC

## 2022-04-12 ENCOUNTER — Encounter: Payer: Self-pay | Admitting: Certified Registered Nurse Anesthetist

## 2022-04-13 ENCOUNTER — Ambulatory Visit (AMBULATORY_SURGERY_CENTER): Payer: 59 | Admitting: Gastroenterology

## 2022-04-13 ENCOUNTER — Encounter: Payer: Self-pay | Admitting: Gastroenterology

## 2022-04-13 VITALS — BP 112/74 | HR 50 | Temp 98.7°F | Resp 14 | Ht 67.0 in | Wt 218.0 lb

## 2022-04-13 DIAGNOSIS — K2210 Ulcer of esophagus without bleeding: Secondary | ICD-10-CM

## 2022-04-13 DIAGNOSIS — K2980 Duodenitis without bleeding: Secondary | ICD-10-CM | POA: Diagnosis not present

## 2022-04-13 DIAGNOSIS — K2100 Gastro-esophageal reflux disease with esophagitis, without bleeding: Secondary | ICD-10-CM

## 2022-04-13 MED ORDER — SODIUM CHLORIDE 0.9 % IV SOLN: 500.0000 mL | Freq: Once | INTRAVENOUS | Status: DC

## 2022-04-13 NOTE — Progress Notes (Signed)
0945 Robinul 0.1 mg IV given due large amount of secretions upon assessment.  MD made aware, vss 

## 2022-04-13 NOTE — Progress Notes (Signed)
[facility] History and Physical   Primary Care Physician:  Tawnya Crook, MD   Reason for Procedure:  GERD with severe erosive esophagitis, dysphagia, large hiatal hernia  Plan:    EGD  with possible interventions as needed     HPI: Angela Hartman is a very pleasant 42 y.o. female here for EGD for evaluation of dysphagia, document healing of severe erosive esophagitis, exclude neoplasia.   The risks and benefits as well as alternatives of endoscopic procedure(s) have been discussed and reviewed. All questions answered. The patient agrees to proceed.    Past Medical History:  Diagnosis Date   Abnormal Pap smear of cervix    has had several   Anemia    Anxiety    Chest pain 2018   Went to ER, EKG normal, due to anxiety per patient   GERD (gastroesophageal reflux disease)    Headache    migraines   Ulcer     Past Surgical History:  Procedure Laterality Date   ABDOMINAL HYSTERECTOMY     CHOLECYSTECTOMY     CYSTOCELE REPAIR N/A 05/06/2016   Procedure: Repair Vaginal Cuff Dehisance,;  Surgeon: Bobbye Charleston, MD;  Location: [facility];  Service: Gynecology;  Laterality: N/A;   CYSTOSCOPY N/A 04/20/2016   Procedure: CYSTOSCOPY;  Surgeon: Bobbye Charleston, MD;  Location: [facility];  Service: Gynecology;  Laterality: N/A;   CYSTOSCOPY  05/06/2016   Procedure: CYSTOSCOPY;  Surgeon: Bobbye Charleston, MD;  Location: [facility];  Service: Gynecology;;   ROBOTIC ASSISTED TOTAL HYSTERECTOMY WITH BILATERAL SALPINGO OOPHERECTOMY N/A 04/20/2016   Procedure: ROBOTIC ASSISTED TOTAL HYSTERECTOMY WITH BILATERAL SALPINGO OOPHORECTOMY;  Surgeon: Bobbye Charleston, MD;  Location: [facility];  Service: Gynecology;  Laterality: N/A;   UPPER GASTROINTESTINAL ENDOSCOPY     WISDOM TOOTH EXTRACTION      Prior to Admission medications   Medication Sig Start Date End Date Taking? Authorizing Provider  Nicotine (NICOTROL NS) 10 MG/ML SOLN Place 0.1 mLs (1 mg total) into the nose as  directed. 1 spray each nostril every 77mnutes to 1 hr as needed 03/23/22   KTawnya Crook MD  TYLENOL DISSOLVE PACKS 500 MG PACK Take 500-1,000 mg by mouth 2 (two) times daily as needed (for headaches).   Yes [provider]  famotidine (PEPCID) 40 MG/5ML suspension Take 2.5 mLs (20 mg total) by mouth daily. 01/19/22 02/23/22  KLeanord AsalK, DO  lansoprazole (PREVACID SOLUTAB) 30 MG disintegrating tablet Take 1 tablet (30 mg total) by mouth 2 (two) times daily. 02/23/22   Zehr, JLaban Emperor PA-C  sucralfate (CARAFATE) 1 g tablet Take 1 tablet (1 g total) by mouth 4 (four) times daily -  with meals and at bedtime. 02/23/22   Zehr, JLaban Emperor PA-C    Current Outpatient Medications  Medication Sig Dispense Refill   Nicotine (NICOTROL NS) 10 MG/ML SOLN Place 0.1 mLs (1 mg total) into the nose as directed. 1 spray each nostril every 337mutes to 1 hr as needed 10 mL 1   TYLENOL DISSOLVE PACKS 500 MG PACK Take 500-1,000 mg by mouth 2 (two) times daily as needed (for headaches).     famotidine (PEPCID) 40 MG/5ML suspension Take 2.5 mLs (20 mg total) by mouth daily. 75 mL 0   lansoprazole (PREVACID SOLUTAB) 30 MG disintegrating tablet Take 1 tablet (30 mg total) by mouth 2 (two) times daily. 60 tablet 5   sucralfate (CARAFATE) 1 g tablet Take 1 tablet (1 g total) by mouth 4 (four) times daily -  with meals and at bedtime. 120 tablet 1   Current Facility-Administered Medications  Medication Dose Route Frequency Provider Last Rate Last Admin   0.9 %  sodium chloride infusion  500 mL Intravenous Once Mauri Pole, MD        Allergies as of 04/13/2022 - Review Complete 04/13/2022  Allergen Reaction Noted   Other Other (See Comments) 04/12/2016   Penicillins Hives and Other (See Comments) 06/19/2012   Tape Rash 01/19/2022    Family History  Problem Relation Age of Onset   Hypertension Mother    Heart disease Father 2       CAD   Heart attack Father    Diabetes Paternal  Grandmother    Esophageal cancer Neg Hx    Colon cancer Neg Hx    Rectal cancer Neg Hx    Stomach cancer Neg Hx     Social History   Socioeconomic History   Marital status: Married    Spouse name: Not on file   Number of children: 2   Years of education: Not on file   Highest education level: Not on file  Occupational History   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   Social Determinants of Health   Financial Resource Strain: Not on file  Food Insecurity: Not on file  Transportation Needs: Not on file  Physical Activity: Not on file  Stress: Not on file  Social Connections: Not on file  Intimate Partner Violence: Not on file    Review of Systems:  All other review of systems negative except as mentioned in the HPI.  Physical Exam: Vital signs in last 24 hours: Blood Pressure 129/65   Pulse 67   Temperature 98.7 F (37.1 C)   Height 5' 7"$  (1.702 m)   Weight 218 lb (98.9 kg)   Last Menstrual Period 04/05/2016 (Exact Date)   Oxygen Saturation 97%   Body Mass Index 34.14 kg/m  General:   Alert, NAD Lungs:  Clear .   Heart:  Regular rate and rhythm Abdomen:  Soft, nontender and nondistended. Neuro/Psych:  Alert and cooperative. Normal mood and affect. A and O x 3  Reviewed labs, radiology imaging, old records and pertinent past GI work up  Patient is appropriate for planned procedure(s) and anesthesia in an ambulatory setting   K. Denzil Magnuson , MD [phone]

## 2022-04-13 NOTE — Progress Notes (Signed)
Called to room to assist during endoscopic procedure.  Patient ID and intended procedure confirmed with present staff. Received instructions for my participation in the procedure from the performing physician.  

## 2022-04-13 NOTE — Op Note (Signed)
[facility] Patient Name: Dary Kretschmer Procedure Date: 04/13/2022 9:45 AM MRN: [id number] Endoscopist: Mauri Pole , MD, [id number] Age: 42 Referring MD:  Date of Birth: 16-Oct-1980 Gender: Female Account #: [id number] Procedure:                Upper GI endoscopy Indications:              Dysphagia, Esophageal reflux symptoms that persist                            despite appropriate therapy Medicines:                Monitored Anesthesia Care Procedure:                Pre-Anesthesia Assessment:                           - Prior to the procedure, a History and Physical                            was performed, and patient medications and                            allergies were reviewed. The patient's tolerance of                            previous anesthesia was also reviewed. The risks                            and benefits of the procedure and the sedation                            options and risks were discussed with the patient.                            All questions were answered, and informed consent                            was obtained. Prior Anticoagulants: The patient has                            taken no anticoagulant or antiplatelet agents. ASA                            Grade Assessment: II - A patient with mild systemic                            disease. After reviewing the risks and benefits,                            the patient was deemed in satisfactory condition to                            undergo the procedure.  After obtaining informed consent, the endoscope was                            passed under direct vision. Throughout the                            procedure, the patient's blood pressure, pulse, and                            oxygen saturations were monitored continuously. The                            Olympus scope [id number] was introduced through the                            mouth, and  advanced to the second part of duodenum.                            The upper GI endoscopy was accomplished without                            difficulty. The patient tolerated the procedure                            well. Scope In: Scope Out: Findings:                 Three linear esophageal ulcers with no bleeding and                            no stigmata of recent bleeding were found 36 to 38                            cm from the incisors. The largest lesion was 5 mm                            in largest dimension.                           The exam of the esophagus was otherwise normal.                           A 6 cm hiatal hernia with a few Cameron ulcers was                            found. The proximal extent of the gastric folds                            (end of tubular esophagus) was 36 cm from the                            incisors. The hiatal narrowing was 42 cm from the  incisors. The Z-line was 35 cm from the incisors.                           The cardia and gastric fundus were normal on                            retroflexion.                           Patchy mildly erythematous mucosa with erosions,                            without active bleeding and with no stigmata of                            bleeding was found in the first portion of the                            duodenum and in the second portion of the duodenum.                            Biopsies were taken with a cold forceps for                            histology. Biopsies for histology were taken with a                            cold forceps for evaluation of celiac disease. Complications:            No immediate complications. Estimated Blood Loss:     Estimated blood loss was minimal. Impression:               - Esophageal ulcers with no bleeding and no                            stigmata of recent bleeding.                           - 6 cm hiatal hernia with a few  Cameron ulcers.                           - Erythematous duodenopathy. Biopsied. Recommendation:           - Patient has a contact number available for                            emergencies. The signs and symptoms of potential                            delayed complications were discussed with the                            patient. Return to normal activities tomorrow.  Written discharge instructions were provided to the                            patient.                           - Resume previous diet.                           - Continue present medications.                           - Continue Prevacid twice daily before breakfast                            and dinner                           - Continue carafate four times daily as needed                           - Await pathology results.                           - Follow an antireflux regimen.                           - CT chest without contrast                           - Esophageal manometry to exclude any major                            esophageal dysmotility to be scheduled next                            available appointment                           - Refer to [facility] for hiatal hernia repair and Nissen                            fundoplication                           - Return to GI office after CT chest and esophageal                            manometry Mauri Pole, MD 04/13/2022 10:03:49 AM This report has been signed electronically.

## 2022-04-13 NOTE — Patient Instructions (Signed)
Thank you for coming in to see [place] today. Biopsy results will be available in approximately 1-2 weeks.  You will be notified of results. Resume previous diet and present medications. Follow anti-reflux regimen.  Handout provided.  Continue Prevacid twice daily before breakfast and dinner every day. Continue Carafate four times daily as needed.  We will be calling to arrange the following:      CT scan without contrast      Esophageal manometry       Appointment with [facility] for evaluation for surgical repair of hiatal hernia and       Nissen  fundoplication      Follow up appointment with Dr Silverio Decamp after the above have been completed.   YOU HAD AN ENDOSCOPIC PROCEDURE TODAY AT [facility]:   Refer to the procedure report that was given to you for any specific questions about what was found during the examination.  If the procedure report does not answer your questions, please call your gastroenterologist to clarify.  If you requested that your care partner not be given the details of your procedure findings, then the procedure report has been included in a sealed envelope for you to review at your convenience later.  YOU SHOULD EXPECT: Some feelings of bloating in the abdomen. Passage of more gas than usual.  Walking can help get rid of the air that was put into your GI tract during the procedure and reduce the bloating. If you had a lower endoscopy (such as a colonoscopy or flexible sigmoidoscopy) you may notice spotting of blood in your stool or on the toilet paper. If you underwent a bowel prep for your procedure, you may not have a normal bowel movement for a few days.  Please Note:  You might notice some irritation and congestion in your nose or some drainage.  This is from the oxygen used during your procedure.  There is no need for concern and it should clear up in a day or so.  SYMPTOMS TO REPORT IMMEDIATELY:  Following upper endoscopy  (EGD)  Vomiting of blood or coffee ground material  New chest pain or pain under the shoulder blades  Painful or persistently difficult swallowing  New shortness of breath  Fever of 100F or higher  Black, tarry-looking stools  For urgent or emergent issues, a gastroenterologist can be reached at any hour by calling [phone]. Do not use MyChart messaging for urgent concerns.    DIET:  We do recommend a small meal at first, but then you may proceed to your regular diet.  Drink plenty of fluids but you should avoid alcoholic beverages for 24 hours.  ACTIVITY:  You should plan to take it easy for the rest of today and you should NOT DRIVE or use heavy machinery until tomorrow (because of the sedation medicines used during the test).    FOLLOW UP: Our staff will call the number listed on your records the next business day following your procedure.  We will call around 7:15- 8:00 am to check on you and address any questions or concerns that you may have regarding the information given to you following your procedure. If we do not reach you, we will leave a message.     If any biopsies were taken you will be contacted by phone or by letter within the next 1-3 weeks.  Please call us at [phone] if you have not heard about the biopsies in 3 weeks.  SIGNATURES/CONFIDENTIALITY: You and/or your care partner have signed paperwork which will be entered into your electronic medical record.  These signatures attest to the fact that that the information above on your After Visit Summary has been reviewed and is understood.  Full responsibility of the confidentiality of this discharge information lies with you and/or your care-partner.

## 2022-04-13 NOTE — Progress Notes (Signed)
Pt's states no medical or surgical changes since previsit or office visit. 

## 2022-04-13 NOTE — Progress Notes (Signed)
Report given to PACU, vss 

## 2022-04-14 ENCOUNTER — Telehealth: Payer: Self-pay

## 2022-04-14 NOTE — Telephone Encounter (Signed)
Follow up call placed, no answer and no VM

## 2022-04-19 ENCOUNTER — Other Ambulatory Visit: Payer: Self-pay

## 2022-04-19 DIAGNOSIS — K449 Diaphragmatic hernia without obstruction or gangrene: Secondary | ICD-10-CM

## 2022-04-26 ENCOUNTER — Telehealth: Payer: Self-pay | Admitting: Gastroenterology

## 2022-04-26 NOTE — Telephone Encounter (Signed)
Patient advised of her appointments for Chest CT, esophageal manometry and her follow up appointment with Dr Silverio Decamp. Referral to [facility] placed.

## 2022-04-26 NOTE — Telephone Encounter (Signed)
Inbound call from patient requesting a call back to discuss appointments that have been made for her. Please advise.

## 2022-05-04 ENCOUNTER — Encounter (HOSPITAL_COMMUNITY)
Admission: RE | Disposition: A | Discharge status: Home / Self Care | Payer: Self-pay | Source: Home / Self Care | Attending: Gastroenterology

## 2022-05-04 ENCOUNTER — Encounter (HOSPITAL_COMMUNITY): Payer: Self-pay | Admitting: Gastroenterology

## 2022-05-04 ENCOUNTER — Encounter: Payer: Self-pay | Admitting: Gastroenterology

## 2022-05-04 ENCOUNTER — Ambulatory Visit (HOSPITAL_COMMUNITY)
Admission: RE | Admit: 2022-05-04 | Discharge: 2022-05-04 | Disposition: A | Discharge status: Home / Self Care | Payer: 59 | Attending: Gastroenterology | Admitting: Gastroenterology

## 2022-05-04 DIAGNOSIS — K449 Diaphragmatic hernia without obstruction or gangrene: Secondary | ICD-10-CM

## 2022-05-04 DIAGNOSIS — K2100 Gastro-esophageal reflux disease with esophagitis, without bleeding: Secondary | ICD-10-CM

## 2022-05-04 DIAGNOSIS — K219 Gastro-esophageal reflux disease without esophagitis: Secondary | ICD-10-CM | POA: Diagnosis not present

## 2022-05-04 HISTORY — PX: ESOPHAGEAL MANOMETRY: SHX5429

## 2022-05-04 SURGERY — MANOMETRY, ESOPHAGUS
Anesthesia: Choice

## 2022-05-04 MED ORDER — LIDOCAINE VISCOUS HCL 2 % MT SOLN
OROMUCOSAL | Status: AC
  Filled 2022-05-04: qty 15

## 2022-05-04 SURGICAL SUPPLY — 2 items
FACESHIELD LNG OPTICON STERILE (SAFETY) IMPLANT
GLOVE BIO SURGEON STRL SZ8 (GLOVE) ×4 IMPLANT

## 2022-05-04 NOTE — Progress Notes (Signed)
Esophageal manometry performed per protocol without complications.  Patient tolerated well. 

## 2022-05-06 ENCOUNTER — Encounter (HOSPITAL_COMMUNITY): Payer: Self-pay | Admitting: Gastroenterology

## 2022-05-19 DIAGNOSIS — K449 Diaphragmatic hernia without obstruction or gangrene: Secondary | ICD-10-CM

## 2022-05-24 ENCOUNTER — Ambulatory Visit
Admission: RE | Admit: 2022-05-24 | Discharge: 2022-05-24 | Disposition: A | Discharge status: Home / Self Care | Payer: 59 | Source: Ambulatory Visit | Attending: Gastroenterology | Admitting: Gastroenterology

## 2022-05-24 DIAGNOSIS — K449 Diaphragmatic hernia without obstruction or gangrene: Secondary | ICD-10-CM

## 2022-06-06 ENCOUNTER — Ambulatory Visit: Payer: Self-pay | Admitting: Surgery

## 2022-06-06 ENCOUNTER — Encounter: Payer: Self-pay | Admitting: Surgery

## 2022-06-06 DIAGNOSIS — N870 Mild cervical dysplasia: Secondary | ICD-10-CM | POA: Insufficient documentation

## 2022-06-06 DIAGNOSIS — K5909 Other constipation: Secondary | ICD-10-CM

## 2022-06-06 HISTORY — DX: Other constipation: K59.09

## 2022-06-08 ENCOUNTER — Other Ambulatory Visit (HOSPITAL_COMMUNITY): Payer: Self-pay | Admitting: Surgery

## 2022-06-08 DIAGNOSIS — R112 Nausea with vomiting, unspecified: Secondary | ICD-10-CM

## 2022-06-08 DIAGNOSIS — K219 Gastro-esophageal reflux disease without esophagitis: Secondary | ICD-10-CM

## 2022-06-08 DIAGNOSIS — K449 Diaphragmatic hernia without obstruction or gangrene: Secondary | ICD-10-CM

## 2022-06-14 ENCOUNTER — Ambulatory Visit: Payer: 59 | Admitting: Gastroenterology

## 2022-06-15 ENCOUNTER — Ambulatory Visit: Payer: 59 | Admitting: Family Medicine

## 2022-06-24 ENCOUNTER — Encounter (HOSPITAL_COMMUNITY)
Admission: RE | Admit: 2022-06-24 | Discharge: 2022-06-24 | Disposition: A | Discharge status: Home / Self Care | Payer: 59 | Source: Ambulatory Visit | Attending: Surgery | Admitting: Surgery

## 2022-06-24 DIAGNOSIS — K219 Gastro-esophageal reflux disease without esophagitis: Secondary | ICD-10-CM | POA: Insufficient documentation

## 2022-06-24 DIAGNOSIS — K449 Diaphragmatic hernia without obstruction or gangrene: Secondary | ICD-10-CM | POA: Insufficient documentation

## 2022-06-24 DIAGNOSIS — R112 Nausea with vomiting, unspecified: Secondary | ICD-10-CM | POA: Diagnosis present

## 2022-06-24 MED ORDER — TECHNETIUM TC 99M SULFUR COLLOID
1.9000 | Freq: Once | INTRAVENOUS | Status: AC | PRN
  Administered 2022-06-24: 1.9 via INTRAVENOUS

## 2022-06-30 NOTE — Progress Notes (Addendum)
COVID Vaccine received:  [x]  No []  Yes Date of any COVID positive Test in last 90 days: none  PCP - Jeani Sow, MD Cardiologist - None  Chest x-ray -  CT chest 05-24-2022  Epic EKG -  01-20-2022 Epic Stress Test -  ECHO - 12-23-2013  Epic Cardiac Cath -   PCR screen: []  Ordered & Completed           []   No Order but Needs PROFEND           [x]   N/A for this surgery  Surgery Plan:  []  Ambulatory                            [x]  Outpatient in bed                            []  Admit  Anesthesia:    [x]  General  []  Spinal                           []   Choice []   MAC  Bowel Prep - [x]  No  []   Yes ______  Pacemaker / ICD device [x]  No []  Yes   Spinal Cord Stimulator:[x]  No []  Yes       History of Sleep Apnea? [x]  No []  Yes   CPAP used?- [x]  No []  Yes    Does the patient monitor blood sugar?          []  No []  Yes  [x]  N/A Patient has: [x]  NO Hx DM   []  Pre-DM                 []  DM1  []   DM2  Blood Thinner / Instructions:  none Aspirin Instructions:   none  ERAS Protocol Ordered: []  No  [x]  Yes PRE-SURGERY [x]  ENSURE x3   []  G2  Patient is to be NPO after: 05:30 am  Activity level: Patient is able climb a flight of stairs without difficulty; [x]  No CP   but would have SOB.. Patient can perform ADLs without assistance.   Anesthesia review: GERD, anemia, smoker, anxiety  Patient denies shortness of breath, fever, cough and chest pain at PAT appointment.  Patient verbalized understanding and agreement to the Pre-Surgical Instructions that were given to them at this PAT appointment. Patient was also educated of the need to review these PAT instructions again prior to  her surgery.I reviewed the appropriate phone numbers to call if they have any and questions or concerns.

## 2022-06-30 NOTE — Patient Instructions (Addendum)
SURGICAL WAITING ROOM VISITATION Patients having surgery or a procedure may have no more than 2 support people in the waiting area - these visitors may rotate in the visitor waiting room.   Due to an increase in RSV and influenza rates and associated hospitalizations, children ages 50 and under may not visit patients in [facility] hospitals. If the patient needs to stay at the hospital during part of their recovery, the visitor guidelines for inpatient rooms apply.  PRE-OP VISITATION  Pre-op nurse will coordinate an appropriate time for 1 support person to accompany the patient in pre-op.  This support person may not rotate.  This visitor will be contacted when the time is appropriate for the visitor to come back in the pre-op area.  Please refer to the [facility] website for the visitor guidelines for Inpatients (after your surgery is over and you are in a regular room).  You are not required to quarantine at this time prior to your surgery. However, you must do this: Hand Hygiene often Do NOT share personal items Notify your provider if you are in close contact with someone who has COVID or you develop fever 100.4 or greater, new onset of sneezing, cough, sore throat, shortness of breath or body aches.  If you test positive for Covid or have been in contact with anyone that has tested positive in the last 10 days please notify you surgeon.    Your procedure is scheduled on:  Wednesday  Jul 13, 2022  Report to [facility] Main Entrance: [facility] entrance where the [employer] is available.   Report to admitting at:  06:15   AM  +++++Call this number if you have any questions or problems the morning of surgery [phone]  Do not eat food after Midnight the night prior to your surgery/procedure.  After Midnight you may have the following liquids until   05:30  AM DAY OF SURGERY  Clear Liquid Diet Water Black Coffee (sugar ok, NO MILK/CREAM OR CREAMERS)  Tea (sugar ok, NO  MILK/CREAM OR CREAMERS) regular and decaf                             Plain Jell-O  with no fruit (NO RED)                                           Fruit ices (not with fruit pulp, NO RED)                                     Popsicles (NO RED)                                                                  Juice: apple, WHITE grape, WHITE cranberry Sports drinks like Gatorade or Powerade (NO RED)                DRINK two (2) bottles of Pre-Surgery Clear Ensure starting at 6:00 pm the evening prior to your surgery to help prevent dehydration. Increase drinking clear  fluids (see list above)             The day of surgery:  Drink ONE (1) Pre-Surgery Clear Ensure at  05:30    AM the morning of surgery. Drink in one sitting. Do not sip.  This drink was given to you during your hospital pre-op appointment visit. Nothing else to drink after completing the Pre-Surgery Clear Ensure: No candy, chewing gum or throat lozenges.    FOLLOW  ANY ADDITIONAL PRE OP INSTRUCTIONS YOU RECEIVED FROM YOUR SURGEON'S OFFICE!!!   Oral Hygiene is also important to reduce your risk of infection.        Remember - BRUSH YOUR TEETH THE MORNING OF SURGERY WITH YOUR REGULAR TOOTHPASTE  Do NOT smoke after Midnight the night before surgery.  Take ONLY these medicines the morning of surgery with A SIP OF WATER: Nexium                   You may not have any metal on your body including hair pins, jewelry, and body piercing  Do not wear make-up, lotions, powders, perfumesor deodorant  Do not wear nail polish including gel and S&S, artificial / acrylic nails, or any other type of covering on natural nails including finger and toenails. If you have artificial nails, gel coating, etc., that needs to be removed by a nail salon, Please have this removed prior to surgery. Not doing so may mean that your surgery could be cancelled or delayed if the Surgeon or anesthesia staff feels like they are unable to monitor you safely.    Do not shave 48 hours prior to surgery to avoid nicks in your skin which may contribute to postoperative infections.   You may bring a small overnight bag with you on the day of surgery, only pack items that are not valuable. [facility] IS NOT RESPONSIBLE   FOR VALUABLES THAT ARE LOST OR STOLEN.   Do not bring your home medications to the hospital. The Pharmacy will dispense medications listed on your medication list to you during your admission in the Hospital.  Please read over the following fact sheets you were given: IF YOU HAVE QUESTIONS ABOUT YOUR PRE-OP INSTRUCTIONS, PLEASE CALL [phone].   [facility] - Preparing for Surgery Before surgery, you can play an important role.  Because skin is not sterile, your skin needs to be as free of germs as possible.  You can reduce the number of germs on your skin by washing with CHG (chlorahexidine gluconate) soap before surgery.  CHG is an antiseptic cleaner which kills germs and bonds with the skin to continue killing germs even after washing. Please DO NOT use if you have an allergy to CHG or antibacterial soaps.  If your skin becomes reddened/irritated stop using the CHG and inform your nurse when you arrive at Short Stay. Do not shave (including legs and underarms) for at least 48 hours prior to the first CHG shower.  You may shave your face/neck.  Please follow these instructions carefully:  1.  Shower with CHG Soap the night before surgery and the  morning of surgery.  2.  If you choose to wash your hair, wash your hair first as usual with your normal  shampoo.  3.  After you shampoo, rinse your hair and body thoroughly to remove the shampoo.  4.  Use CHG as you would any other liquid soap.  You can apply chg directly to the skin and wash.  Gently with a scrungie or clean washcloth.  5.  Apply the CHG Soap to your body ONLY FROM THE NECK DOWN.   Do not use on face/ open                           Wound or open  sores. Avoid contact with eyes, ears mouth and genitals (private parts).                       Wash face,  Genitals (private parts) with your normal soap.             6.  Wash thoroughly, paying special attention to the area where your  surgery  will be performed.  7.  Thoroughly rinse your body with warm water from the neck down.  8.  DO NOT shower/wash with your normal soap after using and rinsing off the CHG Soap.            9.  Pat yourself dry with a clean towel.            10.  Wear clean pajamas.            11.  Place clean sheets on your bed the night of your first shower and do not  sleep with pets.  ON THE DAY OF SURGERY : Do not apply any lotions/deodorants the morning of surgery.  Please wear clean clothes to the hospital/surgery center.    FAILURE TO FOLLOW THESE INSTRUCTIONS MAY RESULT IN THE CANCELLATION OF YOUR SURGERY  PATIENT SIGNATURE_________________________________  NURSE SIGNATURE__________________________________  ________________________________________________________________________

## 2022-07-01 ENCOUNTER — Other Ambulatory Visit: Payer: Self-pay

## 2022-07-01 ENCOUNTER — Encounter (HOSPITAL_COMMUNITY): Payer: Self-pay

## 2022-07-01 ENCOUNTER — Encounter (HOSPITAL_COMMUNITY)
Admission: RE | Admit: 2022-07-01 | Discharge: 2022-07-01 | Disposition: A | Discharge status: Home / Self Care | Payer: 59 | Source: Ambulatory Visit | Attending: Surgery | Admitting: Surgery

## 2022-07-01 VITALS — BP 120/72 | HR 86 | Temp 98.6°F | Resp 18 | Ht 67.0 in | Wt 215.0 lb

## 2022-07-01 DIAGNOSIS — Z01818 Encounter for other preprocedural examination: Secondary | ICD-10-CM

## 2022-07-01 DIAGNOSIS — Z01812 Encounter for preprocedural laboratory examination: Secondary | ICD-10-CM | POA: Insufficient documentation

## 2022-07-01 HISTORY — DX: Personal history of other diseases of the digestive system: Z87.19

## 2022-07-01 LAB — CBC
HCT: 46.7 % — ABNORMAL HIGH (ref 36.0–46.0)
Hemoglobin: 15.2 g/dL — ABNORMAL HIGH (ref 12.0–15.0)
MCH: 29.7 pg (ref 26.0–34.0)
MCHC: 32.5 g/dL (ref 30.0–36.0)
MCV: 91.4 fL (ref 80.0–100.0)
Platelets: 277 10*3/uL (ref 150–400)
RBC: 5.11 MIL/uL (ref 3.87–5.11)
RDW: 12.6 % (ref 11.5–15.5)
WBC: 9.7 10*3/uL (ref 4.0–10.5)
nRBC: 0 % (ref 0.0–0.2)

## 2022-07-01 LAB — COMPREHENSIVE METABOLIC PANEL
ALT: 35 U/L (ref 0–44)
AST: 32 U/L (ref 15–41)
Albumin: 4 g/dL (ref 3.5–5.0)
Alkaline Phosphatase: 90 U/L (ref 38–126)
Anion gap: 7 (ref 5–15)
BUN: 17 mg/dL (ref 6–20)
CO2: 26 mmol/L (ref 22–32)
Calcium: 9 mg/dL (ref 8.9–10.3)
Chloride: 106 mmol/L (ref 98–111)
Creatinine, Ser: 0.91 mg/dL (ref 0.44–1.00)
GFR, Estimated: >60 mL/min (ref >60)
Glucose, Bld: 99 mg/dL (ref 70–99)
Potassium: 4 mmol/L (ref 3.5–5.1)
Sodium: 139 mmol/L (ref 135–145)
Total Bilirubin: 0.6 mg/dL (ref 0.3–1.2)
Total Protein: 7.4 g/dL (ref 6.5–8.1)

## 2022-07-12 NOTE — Anesthesia Preprocedure Evaluation (Signed)
Anesthesia Evaluation  Patient identified by MRN, date of birth, ID band Patient awake    Reviewed: Allergy & Precautions, NPO status , Patient's Chart, lab work & pertinent test results  Airway Mallampati: III  TM Distance: >3 FB Neck ROM: Full    Dental  (+) Teeth Intact, Dental Advisory Given   Pulmonary Current Smoker and Patient abstained from smoking.   breath sounds clear to auscultation       Cardiovascular negative cardio ROS  Rhythm:Regular Rate:Normal     Neuro/Psych  Headaches PSYCHIATRIC DISORDERS Anxiety        GI/Hepatic Neg liver ROS, hiatal hernia,GERD  Medicated,,PARAESOPHAGEAL HIATAL HERNIA REFRACTORY TO MEDICAL MANAGEMENT   Endo/Other  negative endocrine ROS    Renal/GU negative Renal ROS     Musculoskeletal negative musculoskeletal ROS (+)    Abdominal   Peds  Hematology negative hematology ROS (+)   Anesthesia Other Findings   Reproductive/Obstetrics                             Anesthesia Physical Anesthesia Plan  ASA: 2  Anesthesia Plan: General   Post-op Pain Management: Tylenol PO (pre-op)*   Induction: Intravenous  PONV Risk Score and Plan: 3 and Midazolam, Dexamethasone and Ondansetron  Airway Management Planned: Oral ETT  Additional Equipment:   Intra-op Plan:   Post-operative Plan: Extubation in OR  Informed Consent: I have reviewed the patients History and Physical, chart, labs and discussed the procedure including the risks, benefits and alternatives for the proposed anesthesia with the patient or authorized representative who has indicated his/her understanding and acceptance.     Dental advisory given  Plan Discussed with: CRNA  Anesthesia Plan Comments: (2nd PIV)       Anesthesia Quick Evaluation

## 2022-07-13 ENCOUNTER — Ambulatory Visit (HOSPITAL_COMMUNITY): Payer: 59 | Admitting: Anesthesiology

## 2022-07-13 ENCOUNTER — Encounter (HOSPITAL_COMMUNITY)
Admission: RE | Disposition: A | Discharge status: Home / Self Care | Payer: Self-pay | Source: Ambulatory Visit | Attending: Surgery

## 2022-07-13 ENCOUNTER — Encounter (HOSPITAL_COMMUNITY): Payer: Self-pay | Admitting: Surgery

## 2022-07-13 ENCOUNTER — Observation Stay (HOSPITAL_COMMUNITY)
Admission: RE | Admit: 2022-07-13 | Discharge: 2022-07-14 | Disposition: A | Discharge status: Home / Self Care | Payer: 59 | Source: Ambulatory Visit | Attending: Surgery | Admitting: Surgery

## 2022-07-13 ENCOUNTER — Ambulatory Visit (HOSPITAL_BASED_OUTPATIENT_CLINIC_OR_DEPARTMENT_OTHER): Payer: 59 | Admitting: Anesthesiology

## 2022-07-13 ENCOUNTER — Other Ambulatory Visit: Payer: Self-pay

## 2022-07-13 DIAGNOSIS — K2980 Duodenitis without bleeding: Secondary | ICD-10-CM | POA: Diagnosis not present

## 2022-07-13 DIAGNOSIS — K5909 Other constipation: Secondary | ICD-10-CM | POA: Diagnosis not present

## 2022-07-13 DIAGNOSIS — F419 Anxiety disorder, unspecified: Secondary | ICD-10-CM | POA: Diagnosis present

## 2022-07-13 DIAGNOSIS — K2100 Gastro-esophageal reflux disease with esophagitis, without bleeding: Secondary | ICD-10-CM | POA: Diagnosis not present

## 2022-07-13 DIAGNOSIS — Z79899 Other long term (current) drug therapy: Secondary | ICD-10-CM | POA: Insufficient documentation

## 2022-07-13 DIAGNOSIS — K449 Diaphragmatic hernia without obstruction or gangrene: Secondary | ICD-10-CM

## 2022-07-13 DIAGNOSIS — R1310 Dysphagia, unspecified: Secondary | ICD-10-CM

## 2022-07-13 DIAGNOSIS — K440 Diaphragmatic hernia with obstruction, without gangrene: Secondary | ICD-10-CM | POA: Diagnosis present

## 2022-07-13 DIAGNOSIS — F17210 Nicotine dependence, cigarettes, uncomplicated: Secondary | ICD-10-CM | POA: Diagnosis not present

## 2022-07-13 DIAGNOSIS — F17200 Nicotine dependence, unspecified, uncomplicated: Secondary | ICD-10-CM | POA: Diagnosis present

## 2022-07-13 HISTORY — PX: XI ROBOTIC ASSISTED PARAESOPHAGEAL HERNIA REPAIR: SHX6871

## 2022-07-13 SURGERY — REPAIR, HERNIA, PARAESOPHAGEAL, ROBOT-ASSISTED
Anesthesia: General

## 2022-07-13 MED ORDER — MAGIC MOUTHWASH: 15.0000 mL | Freq: Four times a day (QID) | ORAL | Status: DC | PRN

## 2022-07-13 MED ORDER — ROCURONIUM BROMIDE 10 MG/ML (PF) SYRINGE
PREFILLED_SYRINGE | INTRAVENOUS | Status: AC
  Filled 2022-07-13: qty 10

## 2022-07-13 MED ORDER — BUPIVACAINE LIPOSOME 1.3 % IJ SUSP: 20.0000 mL | Freq: Once | INTRAMUSCULAR | Status: DC

## 2022-07-13 MED ORDER — CHLORHEXIDINE GLUCONATE 0.12 % MT SOLN: 15.0000 mL | Freq: Once | OROMUCOSAL | Status: DC

## 2022-07-13 MED ORDER — CLINDAMYCIN PHOSPHATE 900 MG/50ML IV SOLN
900.0000 mg | INTRAVENOUS | Status: AC
  Administered 2022-07-13: 900 mg via INTRAVENOUS
  Filled 2022-07-13: qty 50

## 2022-07-13 MED ORDER — GABAPENTIN 300 MG PO CAPS
300.0000 mg | ORAL_CAPSULE | ORAL | Status: DC
  Filled 2022-07-13: qty 1

## 2022-07-13 MED ORDER — PROCHLORPERAZINE MALEATE 10 MG PO TABS: 10.0000 mg | ORAL_TABLET | Freq: Four times a day (QID) | ORAL | Status: DC | PRN

## 2022-07-13 MED ORDER — LIP MEDEX EX OINT
TOPICAL_OINTMENT | Freq: Two times a day (BID) | CUTANEOUS | Status: DC
  Administered 2022-07-13: 75 via TOPICAL
  Filled 2022-07-13 (×3): qty 7

## 2022-07-13 MED ORDER — ACETAMINOPHEN 160 MG/5ML PO SOLN
1000.0000 mg | Freq: Four times a day (QID) | ORAL | Status: DC
  Administered 2022-07-13 – 2022-07-14 (×3): 1000 mg via ORAL
  Filled 2022-07-13 (×4): qty 40.6

## 2022-07-13 MED ORDER — PROCHLORPERAZINE EDISYLATE 10 MG/2ML IJ SOLN: 5.0000 mg | Freq: Four times a day (QID) | INTRAMUSCULAR | Status: DC | PRN

## 2022-07-13 MED ORDER — ENSURE PRE-SURGERY PO LIQD: 296.0000 mL | Freq: Once | ORAL | Status: DC

## 2022-07-13 MED ORDER — DIPHENHYDRAMINE HCL 50 MG/ML IJ SOLN: 12.5000 mg | Freq: Four times a day (QID) | INTRAMUSCULAR | Status: DC | PRN

## 2022-07-13 MED ORDER — ONDANSETRON HCL 4 MG/2ML IJ SOLN
4.0000 mg | Freq: Four times a day (QID) | INTRAMUSCULAR | Status: DC | PRN
  Administered 2022-07-13: 4 mg via INTRAVENOUS
  Filled 2022-07-13: qty 2

## 2022-07-13 MED ORDER — ONDANSETRON HCL 4 MG PO TABS: 4.0000 mg | ORAL_TABLET | Freq: Three times a day (TID) | ORAL | 5 refills | Status: DC | PRN

## 2022-07-13 MED ORDER — ONDANSETRON HCL 4 MG/2ML IJ SOLN
INTRAMUSCULAR | Status: AC
  Filled 2022-07-13: qty 2

## 2022-07-13 MED ORDER — VASOPRESSIN 20 UNIT/ML IV SOLN
INTRAVENOUS | Status: DC | PRN
  Administered 2022-07-13: 1 [IU] via INTRAVENOUS

## 2022-07-13 MED ORDER — FENTANYL CITRATE PF 50 MCG/ML IJ SOSY
PREFILLED_SYRINGE | INTRAMUSCULAR | Status: AC
  Administered 2022-07-13: 50 ug via INTRAVENOUS
  Filled 2022-07-13: qty 2

## 2022-07-13 MED ORDER — ORAL CARE MOUTH RINSE: 15.0000 mL | Freq: Once | OROMUCOSAL | Status: DC

## 2022-07-13 MED ORDER — ACETAMINOPHEN 500 MG PO TABS: 1000.0000 mg | ORAL_TABLET | Freq: Four times a day (QID) | ORAL | Status: DC

## 2022-07-13 MED ORDER — EPHEDRINE 5 MG/ML INJ
INTRAVENOUS | Status: AC
  Filled 2022-07-13: qty 5

## 2022-07-13 MED ORDER — ENSURE PRE-SURGERY PO LIQD: 592.0000 mL | Freq: Once | ORAL | Status: DC

## 2022-07-13 MED ORDER — SIMETHICONE 40 MG/0.6ML PO SUSP
80.0000 mg | Freq: Four times a day (QID) | ORAL | Status: DC | PRN
  Administered 2022-07-14: 80 mg via ORAL
  Filled 2022-07-13: qty 1.2

## 2022-07-13 MED ORDER — DEXAMETHASONE SODIUM PHOSPHATE 10 MG/ML IJ SOLN
INTRAMUSCULAR | Status: DC | PRN
  Administered 2022-07-13: 10 mg via INTRAVENOUS

## 2022-07-13 MED ORDER — PROMETHAZINE HCL 25 MG/ML IJ SOLN: 6.2500 mg | INTRAMUSCULAR | Status: DC | PRN

## 2022-07-13 MED ORDER — HYDROMORPHONE HCL 1 MG/ML IJ SOLN
0.5000 mg | INTRAMUSCULAR | Status: DC | PRN
  Administered 2022-07-13 – 2022-07-14 (×3): 1 mg via INTRAVENOUS
  Filled 2022-07-13 (×3): qty 1

## 2022-07-13 MED ORDER — ROCURONIUM BROMIDE 10 MG/ML (PF) SYRINGE
PREFILLED_SYRINGE | INTRAVENOUS | Status: DC | PRN
  Administered 2022-07-13: 20 mg via INTRAVENOUS
  Administered 2022-07-13: 10 mg via INTRAVENOUS
  Administered 2022-07-13: 70 mg via INTRAVENOUS

## 2022-07-13 MED ORDER — DEXAMETHASONE SODIUM PHOSPHATE 4 MG/ML IJ SOLN
4.0000 mg | Freq: Two times a day (BID) | INTRAMUSCULAR | Status: DC
  Administered 2022-07-13 – 2022-07-14 (×2): 4 mg via INTRAVENOUS
  Filled 2022-07-13 (×2): qty 1

## 2022-07-13 MED ORDER — FENTANYL CITRATE (PF) 250 MCG/5ML IJ SOLN
INTRAMUSCULAR | Status: AC
  Filled 2022-07-13: qty 5

## 2022-07-13 MED ORDER — KETAMINE HCL-SODIUM CHLORIDE 100-0.9 MG/10ML-% IV SOSY
PREFILLED_SYRINGE | INTRAVENOUS | Status: DC | PRN
  Administered 2022-07-13: 30 mg via INTRAVENOUS

## 2022-07-13 MED ORDER — SCOPOLAMINE 1 MG/3DAYS TD PT72
1.0000 | MEDICATED_PATCH | TRANSDERMAL | Status: DC
  Administered 2022-07-13: 1.5 mg via TRANSDERMAL
  Filled 2022-07-13: qty 1

## 2022-07-13 MED ORDER — SODIUM CHLORIDE 0.9% FLUSH: 3.0000 mL | INTRAVENOUS | Status: DC | PRN

## 2022-07-13 MED ORDER — BISACODYL 10 MG RE SUPP: 10.0000 mg | Freq: Every day | RECTAL | Status: DC | PRN

## 2022-07-13 MED ORDER — ACETAMINOPHEN 10 MG/ML IV SOLN
1000.0000 mg | Freq: Once | INTRAVENOUS | Status: AC
  Administered 2022-07-13: 1000 mg via INTRAVENOUS
  Filled 2022-07-13: qty 100

## 2022-07-13 MED ORDER — SIMETHICONE 80 MG PO CHEW: 40.0000 mg | CHEWABLE_TABLET | Freq: Four times a day (QID) | ORAL | Status: DC | PRN

## 2022-07-13 MED ORDER — PANTOPRAZOLE SODIUM 40 MG PO TBEC
40.0000 mg | DELAYED_RELEASE_TABLET | Freq: Every day | ORAL | Status: DC
  Filled 2022-07-13 (×2): qty 1

## 2022-07-13 MED ORDER — METOPROLOL TARTRATE 5 MG/5ML IV SOLN: 5.0000 mg | Freq: Four times a day (QID) | INTRAVENOUS | Status: DC | PRN

## 2022-07-13 MED ORDER — EPHEDRINE SULFATE-NACL 50-0.9 MG/10ML-% IV SOSY
PREFILLED_SYRINGE | INTRAVENOUS | Status: DC | PRN
  Administered 2022-07-13: 5 mg via INTRAVENOUS

## 2022-07-13 MED ORDER — LACTATED RINGERS IV SOLN: INTRAVENOUS | Status: DC | PRN

## 2022-07-13 MED ORDER — SODIUM CHLORIDE 0.9 % IV SOLN: 250.0000 mL | INTRAVENOUS | Status: DC | PRN

## 2022-07-13 MED ORDER — ACETAMINOPHEN 500 MG PO TABS
1000.0000 mg | ORAL_TABLET | ORAL | Status: DC
  Filled 2022-07-13: qty 2

## 2022-07-13 MED ORDER — PHENYLEPHRINE 80 MCG/ML (10ML) SYRINGE FOR IV PUSH (FOR BLOOD PRESSURE SUPPORT)
PREFILLED_SYRINGE | INTRAVENOUS | Status: DC | PRN
  Administered 2022-07-13 (×5): 160 ug via INTRAVENOUS

## 2022-07-13 MED ORDER — TRAMADOL HCL 50 MG PO TABS: 50.0000 mg | ORAL_TABLET | Freq: Four times a day (QID) | ORAL | Status: DC | PRN

## 2022-07-13 MED ORDER — FENTANYL CITRATE PF 50 MCG/ML IJ SOSY
25.0000 ug | PREFILLED_SYRINGE | INTRAMUSCULAR | Status: DC | PRN
  Administered 2022-07-13: 50 ug via INTRAVENOUS

## 2022-07-13 MED ORDER — ENOXAPARIN SODIUM 40 MG/0.4ML IJ SOSY
40.0000 mg | PREFILLED_SYRINGE | INTRAMUSCULAR | Status: DC
  Administered 2022-07-14: 40 mg via SUBCUTANEOUS
  Filled 2022-07-13: qty 0.4

## 2022-07-13 MED ORDER — 0.9 % SODIUM CHLORIDE (POUR BTL) OPTIME
TOPICAL | Status: DC | PRN
  Administered 2022-07-13: 1000 mL

## 2022-07-13 MED ORDER — CHLORHEXIDINE GLUCONATE CLOTH 2 % EX PADS: 6.0000 | MEDICATED_PAD | Freq: Once | CUTANEOUS | Status: DC

## 2022-07-13 MED ORDER — PHENYLEPHRINE HCL-NACL 20-0.9 MG/250ML-% IV SOLN
INTRAVENOUS | Status: DC | PRN
  Administered 2022-07-13: 25 ug/min via INTRAVENOUS

## 2022-07-13 MED ORDER — MIDAZOLAM HCL 5 MG/5ML IJ SOLN
INTRAMUSCULAR | Status: DC | PRN
  Administered 2022-07-13: 2 mg via INTRAVENOUS

## 2022-07-13 MED ORDER — LIDOCAINE HCL (PF) 2 % IJ SOLN
INTRAMUSCULAR | Status: AC
  Filled 2022-07-13: qty 5

## 2022-07-13 MED ORDER — LACTATED RINGERS IV BOLUS: 1000.0000 mL | Freq: Three times a day (TID) | INTRAVENOUS | Status: DC | PRN

## 2022-07-13 MED ORDER — VASOPRESSIN 20 UNIT/ML IV SOLN
INTRAVENOUS | Status: AC
  Filled 2022-07-13: qty 1

## 2022-07-13 MED ORDER — PROPOFOL 10 MG/ML IV BOLUS
INTRAVENOUS | Status: DC | PRN
  Administered 2022-07-13: 170 mg via INTRAVENOUS

## 2022-07-13 MED ORDER — PHENYLEPHRINE 80 MCG/ML (10ML) SYRINGE FOR IV PUSH (FOR BLOOD PRESSURE SUPPORT)
PREFILLED_SYRINGE | INTRAVENOUS | Status: AC
  Filled 2022-07-13: qty 10

## 2022-07-13 MED ORDER — BUPIVACAINE LIPOSOME 1.3 % IJ SUSP
INTRAMUSCULAR | Status: AC
  Filled 2022-07-13: qty 20

## 2022-07-13 MED ORDER — BUPIVACAINE LIPOSOME 1.3 % IJ SUSP
INTRAMUSCULAR | Status: DC | PRN
  Administered 2022-07-13: 80 mL

## 2022-07-13 MED ORDER — LIDOCAINE HCL (PF) 2 % IJ SOLN
INTRAMUSCULAR | Status: DC | PRN
  Administered 2022-07-13: 1.5 mg/kg/h via INTRADERMAL

## 2022-07-13 MED ORDER — OXYCODONE HCL 5 MG PO TABS: 5.0000 mg | ORAL_TABLET | ORAL | Status: DC | PRN

## 2022-07-13 MED ORDER — LIDOCAINE HCL 2 % IJ SOLN
INTRAMUSCULAR | Status: AC
  Filled 2022-07-13: qty 20

## 2022-07-13 MED ORDER — LACTATED RINGERS IV SOLN: INTRAVENOUS | Status: DC

## 2022-07-13 MED ORDER — PHENOL 1.4 % MT LIQD: 2.0000 | OROMUCOSAL | Status: DC | PRN

## 2022-07-13 MED ORDER — METHOCARBAMOL 1000 MG/10ML IJ SOLN
1000.0000 mg | Freq: Four times a day (QID) | INTRAVENOUS | Status: DC | PRN
  Administered 2022-07-13 – 2022-07-14 (×3): 1000 mg via INTRAVENOUS
  Filled 2022-07-13 (×3): qty 1000

## 2022-07-13 MED ORDER — PANTOPRAZOLE SODIUM 40 MG IV SOLR
40.0000 mg | Freq: Every day | INTRAVENOUS | Status: DC
  Administered 2022-07-13: 40 mg via INTRAVENOUS
  Filled 2022-07-13: qty 10

## 2022-07-13 MED ORDER — DEXAMETHASONE SODIUM PHOSPHATE 10 MG/ML IJ SOLN
INTRAMUSCULAR | Status: AC
  Filled 2022-07-13: qty 1

## 2022-07-13 MED ORDER — GENTAMICIN SULFATE 40 MG/ML IJ SOLN
5.0000 mg/kg | INTRAVENOUS | Status: AC
  Administered 2022-07-13: 380 mg via INTRAVENOUS
  Filled 2022-07-13: qty 9.5

## 2022-07-13 MED ORDER — ONDANSETRON HCL 4 MG/2ML IJ SOLN
INTRAMUSCULAR | Status: DC | PRN
  Administered 2022-07-13: 4 mg via INTRAVENOUS

## 2022-07-13 MED ORDER — DEXAMETHASONE SODIUM PHOSPHATE 4 MG/ML IJ SOLN: 4.0000 mg | INTRAMUSCULAR | Status: DC

## 2022-07-13 MED ORDER — MENTHOL 3 MG MT LOZG: 1.0000 | LOZENGE | OROMUCOSAL | Status: DC | PRN

## 2022-07-13 MED ORDER — PANTOPRAZOLE SODIUM 40 MG PO TBEC: 40.0000 mg | DELAYED_RELEASE_TABLET | Freq: Every day | ORAL | Status: DC

## 2022-07-13 MED ORDER — OXYCODONE-ACETAMINOPHEN 5-325 MG PO TABS: 1.0000 | ORAL_TABLET | Freq: Four times a day (QID) | ORAL | 0 refills | Status: DC | PRN

## 2022-07-13 MED ORDER — KETAMINE HCL 50 MG/5ML IJ SOSY
PREFILLED_SYRINGE | INTRAMUSCULAR | Status: AC
  Filled 2022-07-13: qty 5

## 2022-07-13 MED ORDER — BUPIVACAINE HCL (PF) 0.25 % IJ SOLN
INTRAMUSCULAR | Status: AC
  Filled 2022-07-13: qty 60

## 2022-07-13 MED ORDER — MAGNESIUM HYDROXIDE 400 MG/5ML PO SUSP: 30.0000 mL | Freq: Every day | ORAL | Status: DC | PRN

## 2022-07-13 MED ORDER — ALUM & MAG HYDROXIDE-SIMETH 200-200-20 MG/5ML PO SUSP: 30.0000 mL | Freq: Four times a day (QID) | ORAL | Status: DC | PRN

## 2022-07-13 MED ORDER — SUGAMMADEX SODIUM 200 MG/2ML IV SOLN
INTRAVENOUS | Status: DC | PRN
  Administered 2022-07-13: 200 mg via INTRAVENOUS

## 2022-07-13 MED ORDER — MIDAZOLAM HCL 2 MG/2ML IJ SOLN
INTRAMUSCULAR | Status: AC
  Filled 2022-07-13: qty 2

## 2022-07-13 MED ORDER — GABAPENTIN 100 MG PO CAPS
300.0000 mg | ORAL_CAPSULE | Freq: Two times a day (BID) | ORAL | Status: DC
  Administered 2022-07-13 – 2022-07-14 (×2): 300 mg via ORAL
  Filled 2022-07-13 (×2): qty 3

## 2022-07-13 MED ORDER — METOCLOPRAMIDE HCL 5 MG/ML IJ SOLN: 5.0000 mg | Freq: Three times a day (TID) | INTRAMUSCULAR | Status: DC | PRN

## 2022-07-13 MED ORDER — NICOTINE 10 MG/ML NA SOLN: 1.0000 mg | NASAL | Status: DC

## 2022-07-13 MED ORDER — FENTANYL CITRATE (PF) 250 MCG/5ML IJ SOLN
INTRAMUSCULAR | Status: DC | PRN
  Administered 2022-07-13: 100 ug via INTRAVENOUS
  Administered 2022-07-13 (×2): 50 ug via INTRAVENOUS

## 2022-07-13 MED ORDER — ONDANSETRON 4 MG PO TBDP: 4.0000 mg | ORAL_TABLET | Freq: Four times a day (QID) | ORAL | Status: DC | PRN

## 2022-07-13 MED ORDER — LIDOCAINE 2% (20 MG/ML) 5 ML SYRINGE
INTRAMUSCULAR | Status: DC | PRN
  Administered 2022-07-13: 80 mg via INTRAVENOUS

## 2022-07-13 MED ORDER — PROPOFOL 10 MG/ML IV BOLUS
INTRAVENOUS | Status: AC
  Filled 2022-07-13: qty 20

## 2022-07-13 MED ORDER — OXYCODONE HCL 5 MG/5ML PO SOLN
5.0000 mg | ORAL | Status: DC | PRN
  Administered 2022-07-14: 10 mg via ORAL
  Filled 2022-07-13: qty 10

## 2022-07-13 MED ORDER — SODIUM CHLORIDE 0.9% FLUSH
3.0000 mL | Freq: Two times a day (BID) | INTRAVENOUS | Status: DC
  Administered 2022-07-13 – 2022-07-14 (×3): 3 mL via INTRAVENOUS

## 2022-07-13 MED ORDER — METHOCARBAMOL 500 MG PO TABS
500.0000 mg | ORAL_TABLET | Freq: Four times a day (QID) | ORAL | Status: DC | PRN
  Filled 2022-07-13: qty 1

## 2022-07-13 MED ORDER — DIPHENHYDRAMINE HCL 12.5 MG/5ML PO ELIX: 12.5000 mg | ORAL_SOLUTION | Freq: Four times a day (QID) | ORAL | Status: DC | PRN

## 2022-07-13 SURGICAL SUPPLY — 73 items
APL PRP STRL LF DISP 70% ISPRP (MISCELLANEOUS) ×1
APL SWBSTK 6 STRL LF DISP (MISCELLANEOUS) ×1
APPLICATOR COTTON TIP 6 STRL (MISCELLANEOUS) ×2 IMPLANT
APPLICATOR COTTON TIP 6IN STRL (MISCELLANEOUS) ×1
APPLIER CLIP 5 13 M/L LIGAMAX5 (MISCELLANEOUS)
APR CLP MED LRG 5 ANG JAW (MISCELLANEOUS)
BAG COUNTER SPONGE SURGICOUNT (BAG) ×2 IMPLANT
BAG SPNG CNTER NS LX DISP (BAG) ×1
BLADE SURG SZ11 CARB STEEL (BLADE) ×2 IMPLANT
CHLORAPREP W/TINT 26 (MISCELLANEOUS) ×2 IMPLANT
CLIP APPLIE 5 13 M/L LIGAMAX5 (MISCELLANEOUS) IMPLANT
COVER SURGICAL LIGHT HANDLE (MISCELLANEOUS) ×2 IMPLANT
COVER TIP SHEARS 8 DVNC (MISCELLANEOUS) IMPLANT
DRAIN CHANNEL 19F RND (DRAIN) IMPLANT
DRAIN PENROSE 0.5X18 (DRAIN) IMPLANT
DRAPE ARM DVNC X/XI (DISPOSABLE) ×8 IMPLANT
DRAPE COLUMN DVNC XI (DISPOSABLE) ×2 IMPLANT
DRAPE WARM FLUID 44X44 (DRAPES) ×2 IMPLANT
DRIVER NDL LRG 8 DVNC XI (INSTRUMENTS) ×4 IMPLANT
DRIVER NDL MEGA SUTCUT DVNCXI (INSTRUMENTS) ×2 IMPLANT
DRIVER NDLE LRG 8 DVNC XI (INSTRUMENTS) IMPLANT
DRIVER NDLE MEGA SUTCUT DVNCXI (INSTRUMENTS) ×1 IMPLANT
DRSG TEGADERM 2-3/8X2-3/4 SM (GAUZE/BANDAGES/DRESSINGS) ×10 IMPLANT
ELECT REM PT RETURN 15FT ADLT (MISCELLANEOUS) ×2 IMPLANT
ENDOLOOP SUT PDS II  0 18 (SUTURE)
ENDOLOOP SUT PDS II 0 18 (SUTURE) IMPLANT
EVACUATOR DRAINAGE 10X20 100CC (DRAIN) IMPLANT
EVACUATOR SILICONE 100CC (DRAIN) IMPLANT
FELT TEFLON 4 X1 (Mesh General) ×2 IMPLANT
FORCEPS BPLR FENES DVNC XI (FORCEP) IMPLANT
FORCEPS PROGRASP DVNC XI (FORCEP) ×2 IMPLANT
GAUZE SPONGE 2X2 8PLY STRL LF (GAUZE/BANDAGES/DRESSINGS) ×2 IMPLANT
GLOVE ECLIPSE 8.0 STRL XLNG CF (GLOVE) ×4 IMPLANT
GLOVE INDICATOR 8.0 STRL GRN (GLOVE) ×4 IMPLANT
GOWN STRL REUS W/ TWL XL LVL3 (GOWN DISPOSABLE) ×8 IMPLANT
GOWN STRL REUS W/TWL XL LVL3 (GOWN DISPOSABLE) ×4
GRASPER SUT TROCAR 14GX15 (MISCELLANEOUS) IMPLANT
GRASPER TIP-UP FEN DVNC XI (INSTRUMENTS) ×2 IMPLANT
IRRIG SUCT STRYKERFLOW 2 WTIP (MISCELLANEOUS) ×1
IRRIGATION SUCT STRKRFLW 2 WTP (MISCELLANEOUS) ×2 IMPLANT
KIT BASIN OR (CUSTOM PROCEDURE TRAY) ×2 IMPLANT
KIT TURNOVER KIT A (KITS) IMPLANT
MESH BIO-A 7X10 SYN MAT (Mesh General) IMPLANT
NDL HYPO 22X1.5 SAFETY MO (MISCELLANEOUS) ×2 IMPLANT
NEEDLE HYPO 22X1.5 SAFETY MO (MISCELLANEOUS) ×1 IMPLANT
OBTURATOR OPTICAL STND 8 DVNC (TROCAR) ×1
OBTURATOR OPTICALSTD 8 DVNC (TROCAR) IMPLANT
PACK CARDIOVASCULAR III (CUSTOM PROCEDURE TRAY) ×2 IMPLANT
PAD POSITIONING PINK XL (MISCELLANEOUS) ×2 IMPLANT
PENCIL SMOKE EVACUATOR (MISCELLANEOUS) IMPLANT
SCISSORS LAP 5X45 EPIX DISP (ENDOMECHANICALS) IMPLANT
SCISSORS MNPLR CVD DVNC XI (INSTRUMENTS) ×2 IMPLANT
SEAL UNIV 5-12 XI (MISCELLANEOUS) ×6 IMPLANT
SEALER VESSEL EXT DVNC XI (MISCELLANEOUS) ×2 IMPLANT
SOL ELECTROSURG ANTI STICK (MISCELLANEOUS) ×1
SOLUTION ELECTROSURG ANTI STCK (MISCELLANEOUS) ×2 IMPLANT
SPIKE FLUID TRANSFER (MISCELLANEOUS) ×2 IMPLANT
STOPCOCK 4 WAY LG BORE MALE ST (IV SETS) ×4 IMPLANT
SUT ETHIBOND 0 36 GRN (SUTURE) ×4 IMPLANT
SUT ETHIBOND NAB CT1 #1 30IN (SUTURE) ×6 IMPLANT
SUT MNCRL AB 4-0 PS2 18 (SUTURE) ×2 IMPLANT
SUT PROLENE 2 0 SH DA (SUTURE) IMPLANT
SUT VICRYL 0 TIES 12 18 (SUTURE) IMPLANT
SUT VICRYL 0 UR6 27IN ABS (SUTURE) IMPLANT
SUT VLOC 180 2-0 9IN GS21 (SUTURE) IMPLANT
SYR 10ML LL (SYRINGE) ×2 IMPLANT
SYR 20ML LL LF (SYRINGE) ×2 IMPLANT
TOWEL OR 17X26 10 PK STRL BLUE (TOWEL DISPOSABLE) ×2 IMPLANT
TOWEL OR NON WOVEN STRL DISP B (DISPOSABLE) ×2 IMPLANT
TRAY FOLEY MTR SLVR 14FR STAT (SET/KITS/TRAYS/PACK) IMPLANT
TRAY FOLEY MTR SLVR 16FR STAT (SET/KITS/TRAYS/PACK) IMPLANT
TROCAR ADV FIXATION 5X100MM (TROCAR) ×2 IMPLANT
TUBING INSUFFLATION 10FT LAP (TUBING) ×2 IMPLANT

## 2022-07-13 NOTE — H&P (Signed)
07/13/2022   REFERRING PHYSICIAN: Montel Culver, MD  Patient Care Team: Jeani Sow, MD as PCP - General (Family Medicine) Michaell Cowing, Shawn Route, MD as Consulting Provider (General Surgery) Montel Culver, MD (Gastroenterology)  PROVIDER: Jarrett Soho, MD  [facility] MRN: [id number] DOB: [birth year]  SUBJECTIVE   Chief Complaint: New Consultation ( Hiatal hernia)   Angela Hartman is a 42 y.o. female  who is seen today as an office consultation  at the request of DrVida Roller  for evaluation of hiatal hernia/GERD   History of Present Illness:  Obese smoking woman that is struggled with heartburn and reflux for many years. For the past several years, she has been followed by [facility] gastroenterology. Dr. Lavon Paganini. Pretty strong history of heartburn reflux and GERD. Went to the ER last year with chest pain and nausea. She had a negative cardiac workup. CT scan again confirmed her hiatal hernia. She is already had a cholecystectomy in 2011 by Dr Zachery Dakins. She is already had a hysterectomy. Found to have esophagitis. EGD in 2021 noted a moderate size hiatal hernia. She was on over-the-counter omeprazole. She was switched over to Protonix. They added Pepcid and Carafate as well. Sounds like there has been an issue of compliance due to the cost of the medications and not wanting to take pills. She shows me a pill of esomeprazole that she is taking 2 pills twice a day. She does not feel it is helping as much anymore. Some mild dysphagia to solids lately. 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 Manometry showed no esophageal dysfunction last month. Surgical consultation recommended.  She comes in by herself. She shows me a bottle of over-the-counter esomeprazole Nexium that she is taking 2 pills twice a day. She does not know if it helps much but she definitely feels worse when she is not on it. Heartburn is become more problem  some. She cannot lie down flat. She cannot lay on her right side. She has to sleep on a wedge. She gets some occasional dysphagia to solids now. She has had episodes of waking up retching or vomiting. Burning. She moves her bowels about twice a week. She is not any narcotics or back issues. She smokes a pack of cigarettes a day. She works third graveyard shift often. No diabetes. She has had a hysterectomy and cholecystectomy. No other abdominal surgeries. She claims she can walk at least half hour without difficulty.  Medical History:  Past Medical History:  Diagnosis Date  GERD (gastroesophageal reflux disease)   Patient Active Problem List  Diagnosis  Hiatal hernia with GERD and esophagitis  Nausea and vomiting  Chest pain  Chronic constipation   Past Surgical History:  Procedure Laterality Date  CHOLECYSTECTOMY  HYSTERECTOMY    Allergies  Allergen Reactions  Penicillin Rash   Current Outpatient Medications on File Prior to Visit  Medication Sig Dispense Refill  esomeprazole (NEXIUM) 20 MG DR capsule Take 20 mg by mouth once daily  lansoprazole (PREVACID SOLUTAB) 30 MG disintegrating tablet Take 30 mg by mouth 2 (two) times daily   No current facility-administered medications on file prior to visit.   Family History  Problem Relation Age of Onset  High blood pressure (Hypertension) Mother  Coronary Artery Disease (Blocked arteries around heart) Father    Social History   Tobacco Use  Smoking Status Every Day  Types: Cigarettes  Smokeless Tobacco Never    Social History   Socioeconomic History  Marital status: Married  Tobacco Use  Smoking status: Every Day  Types: Cigarettes  Smokeless tobacco: Never  Vaping Use  Vaping status: Never Used  Substance and Sexual Activity  Alcohol use: Not Currently  Drug use: Never   ############################################################  Review of Systems: A complete review of systems (ROS) was obtained from the  patient.  We have reviewed this information and discussed as appropriate with the patient.  See HPI as well for other pertinent ROS.  Constitutional: No fevers, chills, sweats. Weight stable Eyes: No vision changes, No discharge HENT: No sore throats, nasal drainage Lymph: No neck swelling, No bruising easily Pulmonary: No cough, productive sputum CV: No orthopnea, PND . No exertional chest/neck/shoulder/arm pain. Patient can walk 30 minutes without difficulty.   GI: No personal nor family history of GI/colon cancer, inflammatory bowel disease, irritable bowel syndrome, allergy such as Celiac Sprue, dietary/dairy problems, colitis. No recent sick contacts/gastroenteritis. No travel outside the country. No changes in diet.  Renal: No UTIs, No hematuria Genital: No drainage, bleeding, masses Musculoskeletal: No severe joint pain. Good ROM major joints Skin: No sores or lesions Heme/Lymph: No easy bleeding. No swollen lymph nodes Neuro: No active seizures. No facial droop Psych: No hallucinations. No agitation  OBJECTIVE   Vitals:  06/06/22 1617  BP: 111/76  Pulse: 91  Temp: 36.8 C (98.3 F)  SpO2: 97%  Weight: 98.3 kg (216 lb 12.8 oz)  Height: 170.2 cm (5\' 7" )  PainSc: 0-No pain   Body mass index is 33.96 kg/m.  PHYSICAL EXAM:  Constitutional: Not cachectic. Hygeine adequate. Vitals signs as above.  Eyes: No glasses. Vision adequate,Pupils reactive, normal extraocular movements. Sclera nonicteric Neuro: CN II-XII intact. No major focal sensory defects. No major motor deficits. Lymph: No head/neck/groin lymphadenopathy Psych: No severe agitation. No severe anxiety. Judgment & insight Adequate, Oriented x4, HENT: Normocephalic, Mucus membranes moist. No thrush. Hearing: adequate Neck: Supple, No tracheal deviation. No obvious thyromegaly Chest: No pain to chest wall compression. Good respiratory excursion. No audible wheezing CV: Pulses intact. regular. No major extremity  edema Ext: No obvious deformity or contracture. Edema: Not present. No cyanosis Skin: No major subcutaneous nodules. Warm and dry Musculoskeletal: Severe joint rigidity not present. No obvious clubbing. No digital petechiae. Mobility: no assist device moving easily without restrictions  Abdomen: Obese Soft. Nondistended. Mild discomfort in epigastric region but no guarding.Marland Kitchen Hernia: Not present. Diastasis recti: Not present. No hepatomegaly. No splenomegaly.  Genital/Pelvic: Inguinal hernia: Not present. Inguinal lymph nodes: without lymphadenopathy nor hidradenitis.   Rectal: (Deferred)    ###################################################################  Labs, Imaging and Diagnostic Testing:  Located in 'Care Everywhere' section of Epic EMR chart  PRIOR [facility] CLINIC NOTES:  Not applicable  SURGERY NOTES:  Not applicable  PATHOLOGY:  Located in 'Care Everywhere' section of Epic EMR chart  Assessment and Plan:    Hiatal hernia with GERD and esophagitis  Nausea and vomiting, unspecified vomiting type    ASSESSMENT/PLAN  Young woman with worsening heartburn reflux and some occasional dysphagia with known incarcerated hiatal hernia and esophagitis. Manometry dissuading any primary esophageal motility issues. I think she would benefit from hiatal hernia repair. Minimally invasive robotic approach. Given her obesity would plan BioA absorbable mesh reinforcement. Phasix if larger defect. I might consider a Nissen fundoplication over a large 54 French bougie to have better longevity given her young age and obesity. Otherwise might do the standard Toupet fundoplication. We will see.  Given her chronic constipation with occasional nausea, would like to complete  the workup with a gastric emptying study. If normal to mild delayed gastric emptying, fundoplication should be sufficient to help correct that. If very severe, would benefit from concurrent pyloroplasty. Hopefully not too  likely but wish to be thorough on the workup.  The anatomy & physiology of the foregut and anti-reflux mechanism was discussed. The pathophysiology of hiatal herniation and GERD was discussed. Natural history risks without surgery was discussed. The patient's symptoms are not adequately controlled by medicines and other non-operative treatments. I feel the risks of no intervention will lead to serious problems that outweigh the operative risks; therefore, I recommended surgery to reduce the hiatal hernia out of the chest and fundoplication to rebuild the anti-reflux valve and control reflux better. Need for a thorough workup to rule out the differential diagnosis and plan treatment was explained. I explained minimally invasive techniques with possible need for an open approach.  Risks such as bleeding, infection, abscess, leak,injury to other organs, need for repair of tissues / organs, need for further treatment, stroke, heart attack, death, and other risks were discussed. I noted a good likelihood this will help address the problem. Goals of post-operative recovery were discussed as well. Possibility that this will not correct all symptoms was explained. Post-operative dysphagia, need for short-term liquid & pureed diet, inability to vomit, possibility of reherniation, possible need for medicines to help control symptoms in addition to surgery were discussed. We will work to minimize complications. Educational handouts further explaining the pathology, treatment options, and dysphagia diet was given as well. Questions were answered. The patient expresses understanding & wishes to proceed with surgery.  07/13/2022 Angela Sportsman, MD, FACS, MASCRS Esophageal, Gastrointestinal & Colorectal Surgery Robotic and Minimally Invasive Surgery  [facility] [address], [address] [place], [place] [address] [phone] Fax [phone] Main  CONTACT  INFORMATION:  Weekday (9AM-5PM): Call [facility] main office at [phone]  Weeknight (5PM-9AM) or Weekend/Holiday: Check [email or web address] (password " [email or web address]") for General Surgery [facility] coverage  (Please, do not use SecureChat as it is not reliable communication to reach operating surgeons for immediate patient care given surgeries/outpatient duties/clinic/cross-coverage/off post-call which would lead to a delay in care.  Epic staff messaging available for outptient concerns, but may not be answered for 48 hours or more).

## 2022-07-13 NOTE — Anesthesia Procedure Notes (Signed)
Procedure Name: Intubation Date/Time: 07/13/2022 8:52 AM  Performed by: Lovie Chol, CRNAPre-anesthesia Checklist: Patient identified, Emergency Drugs available, Suction available and Patient being monitored Patient Re-evaluated:Patient Re-evaluated prior to induction Oxygen Delivery Method: Circle System Utilized Preoxygenation: Pre-oxygenation with 100% oxygen Induction Type: IV induction Ventilation: Mask ventilation without difficulty Laryngoscope Size: Miller and 3 Grade View: Grade I Tube type: Oral Tube size: 7.0 mm Number of attempts: 1 Airway Equipment and Method: Stylet Placement Confirmation: ETT inserted through vocal cords under direct vision, positive ETCO2 and breath sounds checked- equal and bilateral Secured at: 22 cm Tube secured with: Tape Dental Injury: Teeth and Oropharynx as per pre-operative assessment

## 2022-07-13 NOTE — Progress Notes (Signed)
Report received from [place], [place].  Will monitor patient till time for her to go to her room.

## 2022-07-13 NOTE — Interval H&P Note (Signed)
History and Physical Interval Note:  07/13/2022 7:20 AM  Angela Hartman  has presented today for surgery, with the diagnosis of PARAESOPHAGEAL HIATAL HERNIA REFRACTORY TO MEDICAL MANAGEMENT.  The various methods of treatment have been discussed with the patient and family. After consideration of risks, benefits and other options for treatment, the patient has consented to  Procedure(s) with comments: ROBOTIC PARAESOPHAGEAL HIATAL HERNIA REPAIR WITH FUNDOPLICATION (N/A) - GEN w/ ERAS LOCAL POSSIBLE PYLOROPLASTY (N/A) as a surgical intervention.  The patient's history has been reviewed, patient examined, no change in status, stable for surgery.  I have reviewed the patient's chart and labs.  Questions were answered to the patient's satisfaction.    Manometry WNL = possible NIssen GES WNL = no pyloroplasty  [name]

## 2022-07-13 NOTE — Op Note (Addendum)
07/13/2022  11:19 AM  PATIENT:  Angela Hartman  42 y.o. female  Patient Care Team: Jeani Sow, MD as PCP - General (Family Medicine) Karie Soda, MD as Consulting Physician (General Surgery) Napoleon Form, MD as Consulting Physician (Gastroenterology)  PRE-OPERATIVE DIAGNOSIS:  PARAESOPHAGEAL HIATAL HERNIA REFRACTORY TO MEDICAL MANAGEMENT  POST-OPERATIVE DIAGNOSIS:  PARAESOPHAGEAL HIATAL HERNIA REFRACTORY TO MEDICAL MANAGEMENT  PROCEDURE:   1. ROBOTIC reduction of paraesophageal hiatal hernia 2. Type II mediastinal dissection. 3. Primary repair of hiatal hernia over pledgets.  4. Anterior & posterior gastropexy. 5. Toupet (270 degree partial posterior x 3cm)  fundoplication 6. Mesh reinforcement with absorbable mesh  SURGEON:  [name], MD  ASSISTANT:  Romie Levee, MD  An experienced assistant was required given the standard of surgical care given the complexity of the case.  This assistant was needed for exposure, dissection, suction, tissue approximation, retraction, perception, etc  ANESTHESIA:  General endotracheal intubation anesthesia (GETA) and Local & regional field block at incision(s) for perioperative & postoperative pain control provided with 60mL of bupivicaine 0.25% with epinephrine  Estimated Blood Loss (EBL):   Total I/O In: 2650 [I.V.:2400; IV Piggyback:250] Out: 20 [Blood:20].   (See anesthesia record)  Delay start of Pharmacological VTE agent (>24hrs) due to concerns of significant anemia, surgical blood loss, or risk of bleeding?:  no  DRAINS: (None) and 19 Fr Blake drain with tip resting in the mediastinum  SPECIMEN:  (no specimen)  DISPOSITION OF SPECIMEN:  Pathology and (not applicable)  COUNTS:  Sponge, needle, & instrument counts CORRECT  PLAN OF CARE: Admit for overnight observation  PATIENT DISPOSITION:  PACU - hemodynamically stable.  INDICATION:   Patient with symptomatic paraesophageal hiatal hernia.   Evidence of Cameron ulceration.  On H2 blockers and proton pump inhibitors.  Followed by [facility] gastroenterology.  Carafate added.  Preoperative manometry showing no severe esophageal dysmotility.  Rather normal.  Gastric emptying study showing no delayed gastric emptying.  The patient has had extensive work-up & we feel the patient will benefit from repair:  The anatomy & physiology of the foregut and anti-reflux mechanism was discussed.  The pathophysiology of hiatal herniation and GERD was discussed.  Natural history risks without surgery was discussed.   The patient's symptoms are not adequately controlled by medicines and other non-operative treatments.  I feel the risks of no intervention will lead to serious problems that outweigh the operative risks; therefore, I recommended surgery to reduce the hiatal hernia out of the chest and fundoplication to rebuild the anti-reflux valve and control reflux better.  Need for a thorough workup to rule out the differential diagnosis and plan treatment was explained.  I explained laparoscopic techniques with possible need for an open approach.  Risks such as bleeding, infection, abscess, leak, need for further treatment, heart attack, death, and other risks were discussed.   I noted a good likelihood this will help address the problem.  Goals of post-operative recovery were discussed as well.  Possibility that this will not correct all symptoms was explained.  Post-operative dysphagia, need for short-term liquid & pureed diet, inability to vomit, possibility of reherniation, possible need for medicines to help control symptoms in addition to surgery were discussed.  We will work to minimize complications.   Educational handouts further explaining the pathology, treatment options, and dysphagia diet was given as well.  Questions were answered.  The patient expresses understanding & wishes to proceed with surgery.  OR FINDINGS:   Moderate-sized paraesophageal  hiatal hernia with 33% of the stomach in the mediastinum.  There was a 9 x 6 cm hiatal defect.  It is a primary repair over pledgets.  Mesh reinforcement was used with GORE BIO-A mesh, a biosynthetic web scaffold made of 67% polyglycolic acid (PGA): 33% trimethylene carbonate (TMC).  The patient has a Toupet (270 degree partial posterior x 4cm)  fundoplication.  The patient has had anterior and posterior gastropexy.  DESCRIPTION:   Informed consent was confirmed.  The patient received IV antibiotics prior to incision.  The underwent general anesthesia without difficulty.  A Foley catheter sterilely placed.  The patient was positioned in split leg with arms tucked. The abdomen was prepped and draped in the sterile fashion.  Surgical time-out confirmed our plan.  I placed a 5 mm port in the left subcostal region using Varess entry technique with the patient in steep reverse Trendelenburg and left side up.  Entry was clean.  We induced carbon dioxide insufflation.  Camera inspection revealed no injury.  Under direct visualization, I placed extra ports.  I also placed a 5 mm port in the left subxiphoid region under direct visualization.  I removed that and placed an Omega-shaped rigid Nathanson liver retractor to lift the left lateral sector of the liver anteriorly to expose the esophageal hiatus.  This was secured to the bed using the iron man system.  The Xi robot was carefully docked and instruments placed and advanced under direct visualization.  We focused on dissection.  Confirmed moderate size hiatal hernia with about a third of the stomach up in the mediastinum.  We grasped the anterior mediastinal sac at the apex of the crus.  I scored through that and got into the anterior mediastinum.  I was able to free the mediastinal sac from its attachments to the pericardium and bilateral pleura using primarily focused gentle blunt dissection as well as focused vessel sealer dissection.  I transected  phrenoesophageal attachments to the inner right crus, preserving a two centimeter cuff of mediastinal sac until I found the base of the crura.  I then came around anteriorly on the left side and freed up the phrenoesophageal attachments of the mediastinal sac on the medial part of the left crus on the superior half.  I did careful mediastinal dissection to free the mediastinal sac.  With that, we could relieve the suction cup affect of the hernia sac and help reduce the stomach back down into the abdomen, flipped back approriately.    We ligated the short gastrics starting about a third the way down the along the greater curvature of the stomach and then coming up proximally over the fundus.  We released the attachments of the stomach to the retroperitoneum until we were able to connect with the prior dissection on the left crus.  We completed the release of phrenoesophageal attachments to the medial part of the left crus down to its base.  With this, we had circumferential mobilization.    We placed the stomach and esophagus on axial tension.  I then did a Type II mediastinal dissection where I freed the esophagus from its attachments to the aorta, spine, pleura, and pericardium using primarily gentle blunt as well as focused ultrasonic dissection.  We saw the anterior & posterior vagus nerves intact.  We preserved it at all times.  I procedded to dissect about 25 cm proximally into the mediastinum.  With that I could straighten out the esophagus and get 4-5 cm of intra-abdominal length  of the esophagus at a best estimation.  Distal esophagus was thickened consistent with chronic esophagitis.  I freed the anterior mediastinal sac off the esophagus & stomach.  We saw the anterior vagus nerve and freed the sac off of the vagus.  I dissected out & removed the fatty  epiphrenic pads at the esophagogastric junction. With that, I could better define the esophagogastric junction.  I confirmed the the patient had 4-5  cm of intra-abdominal esophageal length off tension.  I freed the left posterior crus/inner diaphragm off the retroperitoneum to get a little more mobility.  I transected some adhesions to the right anterior crus up towards the splenic vessels to get some relaxation there as well.  I brought the fundus of the stomach posterior to the esophagus over to the right side.  The wrap was mobile with the classic shoe shine maneuver.  Wrap became together gently.  We reflected the stomach left laterally and closed the esophageal hiatus using #1 Ethibond stitch using horizontal mattress stitches with pledgets on both sides.  I did that x2 stitches.  I then did a third #1 Ethibond horizontal mattress suture at the most cephalad aspect without pledgets.  The crura had good substance and they came together well without any tension.  Because of her young age and obesity, I reinforced the repair using a Bio-A 10x7 cm biosynthetic precut mesh. We brought the mesh in and laid it over the crural repair, tails anterior over the crura.  I tucked the broader "U" tail of the mesh between the left diaphragm and the spleen, the narrower "U" tail over the right crus.  I secured to the left lateral and left superior sides of the broader "U" tail to the left diaphragm band with 2-0 V lock running suture.  Secured the narrow towel to the right crus using a running 2-0 V-lock suture as well  I brought the fundus of the stomach behind the esophagus and cardia to set up a fundoplication wrap.  I did a posterior gastropexy x3  by taking of #1 Ethibond interrupted stitches to the posterior part of the right side of the wrap and thru the mesh and crural closure.  I placed a similar stitch on the left anterior side as well.  That way the stomach covered the mesh and protected it from any esophageal exposure.  At the completion of this it seem like the wrap was not laying well.  I freed off the stitches off.  Noted there was adequate esophageal  like it was just the cardia was rather inflamed make anatomy a challenging see.  Again confirmed I can get a good shoeshine maneuver and good set up for a posterior wrap.  Trimmed some of the Bio-A mesh to have a larger opening so it was not too snug around the esophagus.  I then redid the posterior gastropexy using 0 Ethibond interrupted sutures x 2  Was a challenge to get the 60 French bougie to pass across the hypopharynx into the proximal esophagus, so aborted attempt at passing it for a Nissen fundoplication.  I therefore did a classic 4cm Toupet fundoplication on the true esophagus above the cardia using 0 Ethibond stitch in the left superior side of the wrap, apex of the left inner crus then left anterior esophagus and tied that down to do a left anterior gastropexy.  Did a mirror-image stitch on the right side to do a right anterior gastropexy.  I then did 2 more distal pairs  of suture between the inner part of the wrap and the anterolateral esophagus.  That way there were 3 total pairs of fundoplication sutures offering a posterior 270 degree wrap.  Measured at 4 cm.  Classic Toupet fundoplication.    The wrap was soft and floppy.  Hiatal closure was snug with a 1 cm gap with gentle tension around the esophagus.  I decided not to do any extra suturing given the fact she had inflammation.  I placed a drain as noted above.  I did irrigation and ensured hemostasis.  I saw no evidence of any leak or perforation or other abnormality.  I removed the [facility] liver retractor under direct visualization.  I evacuated carbon dioxide and removed the ports.  The skin was closed with Monocryl and sterile dressings applied.  Drain secured  The patient is being extubated and brought back to the recovery room.  I discussed postop care in detail with the patient and family in in the office.  Discussed again with the patient in the holding area.  I discussed operative findings, updated the patient's status, discussed  probable steps to recovery, and gave postoperative recommendations to the patient's spouse, Jentry Kristoff .  Recommendations were made.  Questions were answered.  He expressed understanding & appreciation.   [name], M.D., F.A.C.S. Gastrointestinal and Minimally Invasive Surgery [facility] [address] [place], [place] [address] [phone] Main / Paging

## 2022-07-13 NOTE — Anesthesia Postprocedure Evaluation (Signed)
Anesthesia Post Note  Patient: Angela Hartman  Procedure(s) Performed: ROBOTIC PARAESOPHAGEAL HIATAL HERNIA REPAIR WITH TOUPE FUNDOPLICATION     Patient location during evaluation: PACU Anesthesia Type: General Level of consciousness: awake and alert Pain management: pain level controlled Vital Signs Assessment: post-procedure vital signs reviewed and stable Respiratory status: spontaneous breathing, nonlabored ventilation, respiratory function stable and patient connected to nasal cannula oxygen Cardiovascular status: blood pressure returned to baseline and stable Postop Assessment: no apparent nausea or vomiting Anesthetic complications: no   No notable events documented.  Last Vitals:  Vitals:   07/13/22 1337 07/13/22 1429  BP: 118/75 107/77  Pulse: 75 70  Resp:  18  Temp: 36.8 C 36.9 C  SpO2:  95%    Last Pain:  Vitals:   07/13/22 1429  TempSrc: Oral  PainSc:                  Collene Schlichter

## 2022-07-13 NOTE — Transfer of Care (Signed)
Immediate Anesthesia Transfer of Care Note  Patient: Angela Hartman  Procedure(s) Performed: ROBOTIC PARAESOPHAGEAL HIATAL HERNIA REPAIR WITH TOUPE FUNDOPLICATION  Patient Location: PACU  Anesthesia Type:General  Level of Consciousness: oriented, drowsy, and patient cooperative  Airway & Oxygen Therapy: Patient Spontanous Breathing and Patient connected to face mask oxygen  Post-op Assessment: Report given to RN and Post -op Vital signs reviewed and stable  Post vital signs: Reviewed  Last Vitals:  Vitals Value Taken Time  BP 126/73 07/13/22 1130  Temp    Pulse 78 07/13/22 1131  Resp 12 07/13/22 1131  SpO2 94 % 07/13/22 1131  Vitals shown include unvalidated device data.  Last Pain:  Vitals:   07/13/22 0709  TempSrc: Oral         Complications: No notable events documented.

## 2022-07-13 NOTE — Interval H&P Note (Signed)
History and Physical Interval Note:  07/13/2022 7:11 AM  Angela Hartman  has presented today for surgery, with the diagnosis of PARAESOPHAGEAL HIATAL HERNIA REFRACTORY TO MEDICAL MANAGEMENT.  The various methods of treatment have been discussed with the patient and family. After consideration of risks, benefits and other options for treatment, the patient has consented to  Procedure(s) with comments: ROBOTIC PARAESOPHAGEAL HIATAL HERNIA REPAIR WITH FUNDOPLICATION (N/A) - GEN w/ ERAS LOCAL POSSIBLE PYLOROPLASTY (N/A) as a surgical intervention.  The patient's history has been reviewed, patient examined, no change in status, stable for surgery.  I have reviewed the patient's chart and labs.  Questions were answered to the patient's satisfaction.    I have re-reviewed the the patient's records, history, medications, and allergies.  I have re-examined the patient.  I again discussed intraoperative plans and goals of post-operative recovery.  The patient agrees to proceed.  Angela Hartman  June 05, 1980 [id number]  Patient Care Team: Jeani Sow, MD as PCP - General (Family Medicine) Karie Soda, MD as Consulting Physician (General Surgery) Napoleon Form, MD as Consulting Physician (Gastroenterology)  Patient Active Problem List   Diagnosis Date Noted   Cervical intraepithelial neoplasia grade 1 06/06/2022   Hiatal hernia 05/19/2022   Gastroesophageal reflux disease 02/23/2022   Nausea and vomiting 02/23/2022   Leg swelling 08/17/2021   Bilateral leg pain 08/17/2021   Weight gain 08/17/2021   Heel pain 08/17/2021   Easy bruising 08/28/2019   Anxiety 08/28/2019   Elevated TSH 08/28/2019   Hyperglycemia 08/28/2019   Postoperative state 04/20/2016   Chest pain 12/09/2013   Smoking 12/09/2013    Past Medical History:  Diagnosis Date   Abnormal Pap smear of cervix    has had several   Anemia    Anxiety    Chest pain 2018   Went to ER, EKG normal, due to  anxiety per patient   GERD (gastroesophageal reflux disease)    Headache    migraines   History of hiatal hernia    Ulcer     Past Surgical History:  Procedure Laterality Date   ABDOMINAL HYSTERECTOMY     CHOLECYSTECTOMY     CYSTOCELE REPAIR N/A 05/06/2016   Procedure: Repair Vaginal Cuff Dehisance,;  Surgeon: Angela Clamp, MD;  Location: [facility];  Service: Gynecology;  Laterality: N/A;   CYSTOSCOPY N/A 04/20/2016   Procedure: CYSTOSCOPY;  Surgeon: Angela Clamp, MD;  Location: [facility];  Service: Gynecology;  Laterality: N/A;   CYSTOSCOPY  05/06/2016   Procedure: CYSTOSCOPY;  Surgeon: Angela Clamp, MD;  Location: [facility];  Service: Gynecology;;   ESOPHAGEAL MANOMETRY N/A 05/04/2022   Procedure: ESOPHAGEAL MANOMETRY (EM);  Surgeon: Napoleon Form, MD;  Location: [facility];  Service: Gastroenterology;  Laterality: N/A;   ROBOTIC ASSISTED TOTAL HYSTERECTOMY WITH BILATERAL SALPINGO OOPHERECTOMY N/A 04/20/2016   Procedure: ROBOTIC ASSISTED TOTAL HYSTERECTOMY WITH BILATERAL SALPINGO OOPHORECTOMY;  Surgeon: Angela Clamp, MD;  Location: [facility];  Service: Gynecology;  Laterality: N/A;   UPPER GASTROINTESTINAL ENDOSCOPY     WISDOM TOOTH EXTRACTION      Social History   Socioeconomic History   Marital status: Married    Spouse name: Not on file   Number of children: 2   Years of education: Not on file   Highest education level: Not on file  Occupational History   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   Social Determinants of Health   Financial Resource  Strain: Not on file  Food Insecurity: Not on file  Transportation Needs: Not on file  Physical Activity: Not on file  Stress: Not on file  Social Connections: Not on file  Intimate Partner Violence: Not on file    Family History  Problem Relation Age of Onset   Hypertension Mother    Heart disease Father 32       CAD   Heart attack Father    Diabetes Paternal Grandmother    Esophageal cancer Neg Hx    Colon cancer Neg Hx    Rectal cancer Neg Hx    Stomach cancer Neg Hx     Medications Prior to Admission  Medication Sig Dispense Refill Last Dose   esomeprazole (NEXIUM) 20 MG capsule Take 40 mg by mouth daily.   07/12/2022   Nicotine (NICOTROL NS) 10 MG/ML SOLN Place 0.1 mLs (1 mg total) into the nose as directed. 1 spray each nostril every to 1 hr as needed (Patient not taking: Reported on 06/29/2022) 10 mL 1 Not Taking   TYLENOL DISSOLVE PACKS 500 MG PACK Take 500-1,000 mg by mouth 2 (two) times daily as needed (for headaches).   Past Month   famotidine (PEPCID) 40 MG/5ML suspension Take 2.5 mLs (20 mg total) by mouth daily. (Patient not taking: Reported on 06/29/2022) 75 mL 0 Not Taking   lansoprazole (PREVACID SOLUTAB) 30 MG disintegrating tablet Take 1 tablet (30 mg total) by mouth 2 (two) times daily. (Patient not taking: Reported on 06/29/2022) 60 tablet 5 Not Taking   sucralfate (CARAFATE) 1 g tablet Take 1 tablet (1 g total) by mouth 4 (four) times daily -  with meals and at bedtime. (Patient not taking: Reported on 06/29/2022) 120 tablet 1 Not Taking    Current Facility-Administered Medications  Medication Dose Route Frequency Provider Last Rate Last Admin   acetaminophen (TYLENOL) tablet 1,000 mg  1,000 mg Oral On Call to OR Karie Soda, MD       bupivacaine liposome (EXPAREL) 1.3 % injection 266 mg  20 mL Infiltration Once Karie Soda, MD       chlorhexidine (PERIDEX) 0.12 % solution 15 mL  15 mL Mouth/Throat Once Beryle Lathe, MD       Or   Oral care mouth  rinse  15 mL Mouth Rinse Once Beryle Lathe, MD       Chlorhexidine Gluconate Cloth 2 % PADS 6 each  6 each Topical Once Karie Soda, MD       And   Chlorhexidine Gluconate Cloth 2 % PADS 6 each  6 each Topical Once Karie Soda, MD       clindamycin (CLEOCIN) IVPB 900 mg  900 mg Intravenous On Call to OR Karie Soda, MD       And   gentamicin (GARAMYCIN) 380 mg in dextrose 5 % 100 mL IVPB  5 mg/kg (Adjusted) Intravenous On Call to OR Karie Soda, MD       dexamethasone (DECADRON) injection 4 mg  4  mg Intravenous On Call to OR Karie Soda, MD       Melene Muller ON 07/14/2022] feeding supplement (ENSURE PRE-SURGERY) liquid 296 mL  296 mL Oral Once Karie Soda, MD       feeding supplement (ENSURE PRE-SURGERY) liquid 592 mL  592 mL Oral Once Karie Soda, MD       gabapentin (NEURONTIN) capsule 300 mg  300 mg Oral On Call to OR Karie Soda, MD       lactated ringers infusion   Intravenous Continuous Beryle Lathe, MD       scopolamine (TRANSDERM-SCOP) 1 MG/3DAYS 1.5 mg  1 patch Transdermal On Call to OR Karie Soda, MD         Allergies  Allergen Reactions   Other Other (See Comments)    CAN ONLY TAKE LIQUID MEDICATION- NO PILLS   Penicillins Hives and Other (See Comments)    Rash   Tape Rash    Ht 5\' 7"  (1.702 m)   Wt 97.5 kg   LMP 04/05/2016 (Exact Date)   BMI 33.67 kg/m   Labs: No results found for this or any previous visit (from the past 48 hour(s)).  Imaging / Studies: NM GASTRIC EMPTYING  Result Date: 06/24/2022 CLINICAL DATA:  Dysphagia, severe erosive esophagitis, hiatal hernia, nausea, vomiting EXAM: NUCLEAR MEDICINE GASTRIC EMPTYING SCAN TECHNIQUE: After oral ingestion of radiolabeled meal, sequential abdominal images were obtained for 4 hours. Percentage of activity emptying the stomach was calculated at 1 hour, 2 hour, 3 hour, and 4 hours. RADIOPHARMACEUTICALS:  1.9 mCi Tc-63m sulfur colloid in standardized meal COMPARISON:  None available FINDINGS: Expected  location of the stomach in the left upper quadrant. Ingested meal empties the stomach gradually over the course of the study. 40% emptied at 1 hr ( normal >= 10%) 66% emptied at 2 hr ( normal >= 40%) 90% emptied at 3 hr ( normal >= 70%) 100% emptied at 4 hr ( normal >= 90%) IMPRESSION: Normal gastric emptying study. Electronically Signed   By: Ulyses Southward M.D.   On: 06/24/2022 16:01     .[name], M.D., F.A.C.S. Gastrointestinal and Minimally Invasive Surgery [facility] [address] [place], [place] [address] [phone] Main / Paging  07/13/2022 7:12 AM    [name]

## 2022-07-13 NOTE — Discharge Instructions (Addendum)
EATING AFTER YOUR ESOPHAGEAL SURGERY (Stomach Fundoplication, Hiatal Hernia repair, Achalasia surgery, etc)  ######################################################################  EAT Start with a pureed / full liquid diet (see below) Gradually transition to a high fiber diet with a fiber supplement over the next month after discharge.    WALK Walk an hour a day.  Control your pain to do that.    CONTROL PAIN Control pain so that you can walk, sleep, tolerate sneezing/coughing, go up/down stairs.  HAVE A BOWEL MOVEMENT DAILY Keep your bowels regular to avoid problems.  OK to try a laxative to override constipation.  OK to use an antidairrheal to slow down diarrhea.  Call if not better after 2 tries  CALL IF YOU HAVE PROBLEMS/CONCERNS Call if you are still struggling despite following these instructions. Call if you have concerns not answered by these instructions  ######################################################################   After your esophageal surgery, expect some sticking with swallowing over the next 1-2 months.    If food sticks when you eat, it is called "dysphagia".  This is due to swelling around your esophagus at the wrap & hiatal diaphragm repair.  It will gradually ease off over the next few months.  To help you through this temporary phase, we start you out on a pureed (blenderized) diet.  Your first meal in the hospital was thin liquids.  You should have been given a pureed diet by the time you left the hospital.  We ask patients to stay on a pureed diet for the first 2-3 weeks to avoid anything getting "stuck" near your recent surgery.  Don't be alarmed if your ability to swallow doesn't progress according to this plan.  Everyone is different and some diets can advance more or less quickly.    It is often helpful to crush your medications or split them as they can sometimes stick, especially the first week or so.   Some BASIC RULES to follow are: Maintain  an upright position whenever eating or drinking. Take small bites - just a teaspoon size bite at a time. Eat slowly.  It may also help to eat only one food at a time. Consider nibbling through smaller, more frequent meals & avoid the urge to eat BIG meals Do not push through feelings of fullness, nausea, or bloatedness Do not mix solid foods and liquids in the same mouthful Try not to "wash foods down" with large gulps of liquids. Avoid carbonated (bubbly/fizzy) drinks.   Avoid foods that make you feel gassy or bloated.  Start with bland foods first.  Wait on trying greasy, fried, or spicy meals until you are tolerating more bland solids well. Understand that it will be hard to burp and belch at first.  This gradually improves with time.  Expect to be more gassy/flatulent/bloated initially.  Walking will help your body manage it better. Consider using medications for bloating that contain simethicone such as  Maalox or Gas-X  Consider crushing her medications, especially smaller pills.  The ability to swallow pills should get easier after a few weeks Eat in a relaxed atmosphere & minimize distractions. Avoid talking while eating.   Do not use straws. Following each meal, sit in an upright position (90 degree angle) for 60 to 90 minutes.  Going for a short walk can help as well If food does stick, don't panic.  Try to relax and let the food pass on its own.  Sipping WARM LIQUID such as strong hot black tea can also help slide it down.  Be gradual in changes & use common sense:  -If you easily tolerating a certain "level" of foods, advance to the next level gradually -If you are having trouble swallowing a particular food, then avoid it.   -If food is sticking when you advance your diet, go back to thinner previous diet (the lower LEVEL) for 1-2 days.  LEVEL 1 = PUREED DIET  Do for the first 2 WEEKS AFTER SURGERY  -Foods in this group are pureed or blenderized to a smooth, mashed  potato-like consistency.  -If necessary, the pureed foods can keep their shape with the addition of a thickening agent.   -Meat should be pureed to a smooth, pasty consistency.  Hot broth or gravy may be added to the pureed meat, approximately 1 oz. of liquid per 3 oz. serving of meat. -CAUTION:  If any foods do not puree into a smooth consistency, swallowing will be more difficult.  (For example, nuts or seeds sometimes do not blend well.)  Hot Foods Cold Foods  Pureed scrambled eggs and cheese Pureed cottage cheese  Baby cereals Thickened juices and nectars  Thinned cooked cereals (no lumps) Thickened milk or eggnog  Pureed [place] toast or pancakes Ensure  Mashed potatoes Ice cream  Pureed parsley, au gratin, scalloped potatoes, candied sweet potatoes Fruit or [place] ice, sherbet  Pureed buttered or alfredo noodles Plain yogurt  Pureed vegetables (no corn or peas) Instant breakfast  Pureed soups and creamed soups Smooth pudding, mousse, custard  Pureed scalloped apples Whipped gelatin  Gravies Sugar, syrup, honey, jelly  Sauces, cheese, tomato, barbecue, white, creamed Cream  Any baby food Creamer  Alcohol in moderation (not beer or champagne) Margarine  Coffee or tea Mayonnaise   Ketchup, mustard   Apple sauce   SAMPLE MENU:  PUREED DIET Breakfast Lunch Dinner  Orange juice, 1/2 cup Cream of wheat, 1/2 cup Pineapple juice, 1/2 cup Pureed [place], barley soup, 3/4 cup Pureed Hawaiian chicken, 3 oz  Scrambled eggs, mashed or blended with cheese, 1/2 cup Tea or coffee, 1 cup  Whole milk, 1 cup  Non-dairy creamer, 2 Tbsp. Mashed potatoes, 1/2 cup Pureed cooled broccoli, 1/2 cup Apple sauce, 1/2 cup Coffee or tea Mashed potatoes, 1/2 cup Pureed spinach, 1/2 cup Frozen yogurt, 1/2 cup Tea or coffee      LEVEL 2 = SOFT DIET  After your first 2 weeks, you can advance to a soft diet.   Keep on this diet until everything goes down easily.  Hot Foods Cold Foods  White fish  Cottage cheese  Stuffed fish Junior baby fruit  Baby food meals Semi thickened juices  Minced soft cooked, scrambled, poached eggs nectars  Souffle & omelets Ripe mashed bananas  Cooked cereals Canned fruit, pineapple sauce, milk  potatoes Milkshake  Buttered or Alfredo noodles Custard  Cooked cooled vegetable Puddings, including tapioca  Sherbet Yogurt  Vegetable soup or alphabet soup Fruit ice, [place] ice  Gravies Whipped gelatin  Sugar, syrup, honey, jelly Junior baby desserts  Sauces:  Cheese, creamed, barbecue, tomato, white Cream  Coffee or tea Margarine   SAMPLE MENU:  LEVEL 2 Breakfast Lunch Dinner  Orange juice, 1/2 cup Oatmeal, 1/2 cup Scrambled eggs with cheese, 1/2 cup Decaffeinated tea, 1 cup Whole milk, 1 cup Non-dairy creamer, 2 Tbsp Pineapple juice, 1/2 cup Minced beef, 3 oz Gravy, 2 Tbsp Mashed potatoes, 1/2 cup Minced fresh broccoli, 1/2 cup Applesauce, 1/2 cup Coffee, 1 cup [place], barley soup, 3/4 cup Minced Hawaiian chicken, 3 oz  Mashed potatoes, 1/2 cup Cooked spinach, 1/2 cup Frozen yogurt, 1/2 cup Non-dairy creamer, 2 Tbsp      LEVEL 3 = CHOPPED DIET  -After all the foods in level 2 (soft diet) are passing through well you should advance up to more chopped foods.  -It is still important to cut these foods into small pieces and eat slowly.  Hot Foods Cold Foods  Poultry Cottage cheese  Chopped Swedish meatballs Yogurt  Meat salads (ground or flaked meat) Milk  Flaked fish (tuna) Milkshakes  Poached or scrambled eggs Soft, cold, dry cereal  Souffles and omelets Fruit juices or nectars  Cooked cereals Chopped canned fruit  Chopped [place] toast or pancakes Canned fruit cocktail  Noodles or pasta (no rice) Pudding, mousse, custard  Cooked vegetables (no frozen peas, corn, or mixed vegetables) Green salad  Canned small sweet peas Ice cream  Creamed soup or vegetable soup Fruit ice, [place] ice  Pureed vegetable soup or alphabet soup  Non-dairy creamer  Ground scalloped apples Margarine  Gravies Mayonnaise  Sauces:  Cheese, creamed, barbecue, tomato, white Ketchup  Coffee or tea Mustard   SAMPLE MENU:  LEVEL 3 Breakfast Lunch Dinner  Orange juice, 1/2 cup Oatmeal, 1/2 cup Scrambled eggs with cheese, 1/2 cup Decaffeinated tea, 1 cup Whole milk, 1 cup Non-dairy creamer, 2 Tbsp Ketchup, 1 Tbsp Margarine, 1 tsp Salt, 1/4 tsp Sugar, 2 tsp Pineapple juice, 1/2 cup Ground beef, 3 oz Gravy, 2 Tbsp Mashed potatoes, 1/2 cup Cooked spinach, 1/2 cup Applesauce, 1/2 cup Decaffeinated coffee Whole milk Non-dairy creamer, 2 Tbsp Margarine, 1 tsp Salt, 1/4 tsp Pureed [place], barley soup, 3/4 cup Barbecue chicken, 3 oz Mashed potatoes, 1/2 cup Ground fresh broccoli, 1/2 cup Frozen yogurt, 1/2 cup Decaffeinated tea, 1 cup Non-dairy creamer, 2 Tbsp Margarine, 1 tsp Salt, 1/4 tsp Sugar, 1 tsp    LEVEL 4:  REGULAR FOODS  -Foods in this group are soft, moist, regularly textured foods.   -This level includes meat and breads, which tend to be the hardest things to swallow.   -Eat very slowly, chew well and continue to avoid carbonated drinks. -most people are at this level in 4-6 weeks  Hot Foods Cold Foods  Baked fish or skinned Soft cheeses - cottage cheese  Souffles and omelets Cream cheese  Eggs Yogurt  Stuffed shells Milk  Spaghetti with meat sauce Milkshakes  Cooked cereal Cold dry cereals (no nuts, dried fruit, coconut)  [place] toast or pancakes Crackers  Buttered toast Fruit juices or nectars  Noodles or pasta (no rice) Canned fruit  Potatoes (all types) Ripe bananas  Soft, cooked vegetables (no corn, lima, or baked beans) Peeled, ripe, fresh fruit  Creamed soups or vegetable soup Cakes (no nuts, dried fruit, coconut)  Canned chicken noodle soup Plain doughnuts  Gravies Ice cream  Bacon dressing Pudding, mousse, custard  Sauces:  Cheese, creamed, barbecue, tomato, white Fruit ice, [place] ice, sherbet   Decaffeinated tea or coffee Whipped gelatin  Pork chops Regular gelatin   Canned fruited gelatin molds   Sugar, syrup, honey, jam, jelly   Cream   Non-dairy   Margarine   Oil   Mayonnaise   Ketchup   Mustard   TROUBLESHOOTING IRREGULAR BOWELS  1) Avoid extremes of bowel movements (no bad constipation/diarrhea)  2) Miralax 17gm mixed in 8oz. water or juice-daily. May use BID as needed.  3) Gas-x,Phazyme, etc. as needed for gas & bloating.  4) Soft,bland diet. No spicy,greasy,fried foods.  5) Prilosec over-the-counter  as needed  6) May hold gluten/wheat products from diet to see if symptoms improve.  7) May try probiotics (Align, Activa, etc) to help calm the bowels down  7) If symptoms become worse call back immediately.    If you have any questions please call our office at [facility]: [phone].    ################################################################  LAPAROSCOPIC SURGERY: POST OP INSTRUCTIONS  ######################################################################  EAT Gradually transition to a high fiber diet with a fiber supplement over the next few weeks after discharge.  Start with a pureed / full liquid diet (see below)  WALK Walk an hour a day.  Control your pain to do that.    CONTROL PAIN Control pain so that you can walk, sleep, tolerate sneezing/coughing, go up/down stairs.  HAVE A BOWEL MOVEMENT DAILY Keep your bowels regular to avoid problems.  OK to try a laxative to override constipation.  OK to use an antidairrheal to slow down diarrhea.  Call if not better after 2 tries  CALL IF YOU HAVE PROBLEMS/CONCERNS Call if you are still struggling despite following these instructions. Call if you have concerns not answered by these instructions  ######################################################################    DIET: See esophageal surgery dietary instructions above  Take your usually prescribed home medications  unless otherwise directed.  Just pick one antacid PPI proton pump inhibitor medication such as Nexium.  Stop Carafate.  Stop Pepcid.  Stop Prevacid.  PAIN CONTROL: Pain is best controlled by a usual combination of three different methods TOGETHER: Ice/Heat Over the counter pain medication Prescription pain medication Most patients will experience some swelling and bruising around the incisions.  Ice packs or heating pads (30-60 minutes up to 6 times a day) will help. Use ice for the first few days to help decrease swelling and bruising, then switch to heat to help relax tight/sore spots and speed recovery.  Some people prefer to use ice alone, heat alone, alternating between ice & heat.  Experiment to what works for you.  Swelling and bruising can take several weeks to resolve.   It is helpful to take an over-the-counter pain medication regularly for the first few weeks.  Choose one of the following that works best for you: Naproxen (Aleve, etc)  Two 220mg  tabs twice a day Ibuprofen (Advil, etc) Three 200mg  tabs four times a day (every meal & bedtime) Acetaminophen (Tylenol, etc) 500-650mg  four times a day (every meal & bedtime) A  prescription for pain medication (such as oxycodone, hydrocodone, tramadol, gabapentin, methocarbamol, etc) should be given to you upon discharge.  Take your pain medication as prescribed.  If you are having problems/concerns with the prescription medicine (does not control pain, nausea, vomiting, rash, itching, etc), please call us [phone] to see if we need to switch you to a different pain medicine that will work better for you and/or control your side effect better. If you need a refill on your pain medication, please give [place] 48 hour notice.  contact your pharmacy.  They will contact our office to request authorization. Prescriptions will not be filled after 5 pm or on week-ends  AVOID GETTING CONSTIPATED.   a.  Between the surgery and the pain medications, it  is common to experience some constipation.  b.  Drink plenty of liquids c   ake a fiber supplement 2 times day (such as Metamucil, Citrucel, FiberCon, MiraLax, etc) to have a bowel movement every day. d.  If you have not had a BM by 2 days after surgery: -drink liquids  only until you have a bowel movement - take MiraLAX 2 doses every 2 hours until you have a bowel movement   Watch out for diarrhea.   If you have many loose bowel movements, simplify your diet to bland foods & liquids for a few days.   Stop any stool softeners and decrease your fiber supplement.   Switching to mild anti-diarrheal medications (Kayopectate, Pepto Bismol) can help.   If this worsens or does not improve, please call us.  Wash / shower every day.  You may shower over the dressings as they are waterproof.  Continue to shower over incision(s) after the dressing is off.  It is good for closed incisions and even open wounds to be washed every day.  Shower every day.  Short baths are fine.  Wash the incisions and wounds clean with soap & water.    You may leave closed incisions open to air if it is dry.   You may cover the incision with clean gauze & replace it after your daily shower for comfort.  TEGADERM:  You have clear gauze band-aid dressings over your closed incision(s).  Remove the dressings 3 days after surgery.    ACTIVITIES as tolerated:   You may resume regular (light) daily activities beginning the next day--such as daily self-care, walking, climbing stairs--gradually increasing activities as tolerated.  If you can walk 30 minutes without difficulty, it is safe to try more intense activity such as jogging, treadmill, bicycling, low-impact aerobics, swimming, etc. Save the most intensive and strenuous activity for last such as sit-ups, heavy lifting, contact sports, etc  Refrain from any heavy lifting or straining until you are off narcotics for pain control.   DO NOT PUSH THROUGH PAIN.  Let pain be your  guide: If it hurts to do something, don't do it.  Pain is your body warning you to avoid that activity for another week until the pain goes down. You may drive when you are no longer taking prescription pain medication, you can comfortably wear a seatbelt, and you can safely maneuver your car and apply brakes. You may have sexual intercourse when it is comfortable.  FOLLOW UP in our office Please call [facility] at [phone] to set up an appointment to see your surgeon in the office for a follow-up appointment approximately 2-3 weeks after your surgery. Make sure that you call for this appointment the day you arrive home to insure a convenient appointment time.  10. IF YOU HAVE DISABILITY OR FAMILY LEAVE FORMS, BRING THEM TO THE OFFICE FOR PROCESSING.  DO NOT GIVE THEM TO YOUR DOCTOR.   WHEN TO CALL us [phone]: Poor pain control Reactions / problems with new medications (rash/itching, nausea, etc)  Fever over 101.5 F (38.5 C) Inability to urinate Nausea and/or vomiting Worsening swelling or bruising Continued bleeding from incision. Increased pain, redness, or drainage from the incision   The clinic staff is available to answer your questions during regular business hours (8:30am-5pm).  Please don't hesitate to call and ask to speak to one of our nurses for clinical concerns.   If you have a medical emergency, go to the nearest emergency room or call 911.  A surgeon from [facility] is always on call at the [facility], [place] [address], [place], [place]  [address] ? MAIN: [phone] ? TOLL FREE: [phone] ?  FAX [phone] [email or web address]  ##############################################################   STOP SMOKING!  We strongly  recommend that you stop smoking.  Smoking increases the risk of surgery including infection in the form of an open wound, pus formation, abscess, hernia at an  incision on the abdomen, etc.  You have an increased risk of other MAJOR complications such as stroke, heart attack, forming clots in the leg and/or lungs, and death.    Smoking Cessation Quitting smoking is important to your health and has many advantages. However, it is not always easy to quit since nicotine is a very addictive drug. Often times, people try 3 times or more before being able to quit. This document explains the best ways for you to prepare to quit smoking. Quitting takes hard work and a lot of effort, but you can do it. ADVANTAGES OF QUITTING SMOKING You will live longer, feel better, and live better. Your body will feel the impact of quitting smoking almost immediately. Within 20 minutes, blood pressure decreases. Your pulse returns to its normal level. After 8 hours, carbon monoxide levels in the blood return to normal. Your oxygen level increases. After 24 hours, the chance of having a heart attack starts to decrease. Your breath, hair, and body stop smelling like smoke. After 48 hours, damaged nerve endings begin to recover. Your sense of taste and smell improve. After 72 hours, the body is virtually free of nicotine. Your bronchial tubes relax and breathing becomes easier. After 2 to 12 weeks, lungs can hold more air. Exercise becomes easier and circulation improves. The risk of having a heart attack, stroke, cancer, or lung disease is greatly reduced. After 1 year, the risk of coronary heart disease is cut in half. After 5 years, the risk of stroke falls to the same as a nonsmoker. After 10 years, the risk of lung cancer is cut in half and the risk of other cancers decreases significantly. After 15 years, the risk of coronary heart disease drops, usually to the level of a nonsmoker. If you are pregnant, quitting smoking will improve your chances of having a healthy baby. The people you live with, especially any children, will be healthier. You will have extra money to spend  on things other than cigarettes. QUESTIONS TO THINK ABOUT BEFORE ATTEMPTING TO QUIT You may want to talk about your answers with your caregiver. Why do you want to quit? If you tried to quit in the past, what helped and what did not? What will be the most difficult situations for you after you quit? How will you plan to handle them? Who can help you through the tough times? Your family? Friends? A caregiver? What pleasures do you get from smoking? What ways can you still get pleasure if you quit? Here are some questions to ask your caregiver: How can you help me to be successful at quitting? What medicine do you think would be best for me and how should I take it? What should I do if I need more help? What is smoking withdrawal like? How can I get information on withdrawal? GET READY Set a quit date. Change your environment by getting rid of all cigarettes, ashtrays, matches, and lighters in your home, car, or work. Do not let people smoke in your home. Review your past attempts to quit. Think about what worked and what did not. GET SUPPORT AND ENCOURAGEMENT You have a better chance of being successful if you have help. You can get support in many ways. Tell your family, friends, and co-workers that you are going to quit and need their support.  Ask them not to smoke around you. Get individual, group, or telephone counseling and support. Programs are available at [employer] and health centers. Call your local health department for information about programs in your area. Spiritual beliefs and practices may help some smokers quit. Download a "quit meter" on your computer to keep track of quit statistics, such as how long you have gone without smoking, cigarettes not smoked, and money saved. Get a self-help book about quitting smoking and staying off of tobacco. LEARN NEW SKILLS AND BEHAVIORS Distract yourself from urges to smoke. Talk to someone, go for a walk, or occupy your time with a  task. Change your normal routine. Take a different route to work. Drink tea instead of coffee. Eat breakfast in a different place. Reduce your stress. Take a hot bath, exercise, or read a book. Plan something enjoyable to do every day. Reward yourself for not smoking. Explore interactive web-based programs that specialize in helping you quit. GET MEDICINE AND USE IT CORRECTLY Medicines can help you stop smoking and decrease the urge to smoke. Combining medicine with the above behavioral methods and support can greatly increase your chances of successfully quitting smoking. Nicotine replacement therapy helps deliver nicotine to your body without the negative effects and risks of smoking. Nicotine replacement therapy includes nicotine gum, lozenges, inhalers, nasal sprays, and skin patches. Some may be available over-the-counter and others require a prescription. Antidepressant medicine helps people abstain from smoking, but how this works is unknown. This medicine is available by prescription. Nicotinic receptor partial agonist medicine simulates the effect of nicotine in your brain. This medicine is available by prescription. Ask your caregiver for advice about which medicines to use and how to use them based on your health history. Your caregiver will tell you what side effects to look out for if you choose to be on a medicine or therapy. Carefully read the information on the package. Do not use any other product containing nicotine while using a nicotine replacement product.  RELAPSE OR DIFFICULT SITUATIONS Most relapses occur within the first 3 months after quitting. Do not be discouraged if you start smoking again. Remember, most people try several times before finally quitting. You may have symptoms of withdrawal because your body is used to nicotine. You may crave cigarettes, be irritable, feel very hungry, cough often, get headaches, or have difficulty concentrating. The withdrawal symptoms are  only temporary. They are strongest when you first quit, but they will go away within 10 14 days. To reduce the chances of relapse, try to: Avoid drinking alcohol. Drinking lowers your chances of successfully quitting. Reduce the amount of caffeine you consume. Once you quit smoking, the amount of caffeine in your body increases and can give you symptoms, such as a rapid heartbeat, sweating, and anxiety. Avoid smokers because they can make you want to smoke. Do not let weight gain distract you. Many smokers will gain weight when they quit, usually less than 10 pounds. Eat a healthy diet and stay active. You can always lose the weight gained after you quit. Find ways to improve your mood other than smoking. FOR MORE INFORMATION  www.smokefree.gov    While it can be one of the most difficult things to do, the [place] community has programs to help you stop.  Consider talking with your primary care physician about options.  Also, Smoking Cessation classes are available through the [facility]:  The smoking cessation program is a proven-effective program from the American Lung Association. The  program is available for anyone 15 and older who currently smokes. The program lasts for 7 weeks and is 8 sessions. Each class will be approximately 1 1/2 hours. The program is every Tuesday.  All classes are 12-1:30pm and same location.  Event Location Information:  Location: [facility] 2nd Floor Conference Room 2-037; located next to [facility] cross streets: [facility] Entrance into the [facility] is adjacent to the [facility] main entrance. The conference room is located on the 2nd floor.  Parking Instructions: Visitor parking is adjacent to [employer] main entrance and the Cancer Center   A smoking cessation program is also offered through the [facility]. Register online at  [email or web address] or call [phone] for more information.  Tobacco cessation counseling is available at [facility]. Call [phone] for a free appointment.  Tobacco cessation classes also are available through the [facility] in [place]. For information, call [phone].  The Patient Education Network features videos on tobacco cessation. Please consult your listings in the center of this book to find instructions on how to access this resource.  If you want more information, ask your nurse.

## 2022-07-13 NOTE — Progress Notes (Signed)
Patient transported to room by [place], NT.  VSS upon departure from PACU.

## 2022-07-14 ENCOUNTER — Encounter (HOSPITAL_COMMUNITY): Payer: Self-pay | Admitting: Surgery

## 2022-07-14 ENCOUNTER — Observation Stay (HOSPITAL_COMMUNITY): Payer: 59

## 2022-07-14 DIAGNOSIS — K449 Diaphragmatic hernia without obstruction or gangrene: Secondary | ICD-10-CM | POA: Diagnosis not present

## 2022-07-14 MED ORDER — PANTOPRAZOLE SODIUM 40 MG IV SOLR
40.0000 mg | Freq: Once | INTRAVENOUS | Status: AC
  Administered 2022-07-14: 40 mg via INTRAVENOUS
  Filled 2022-07-14: qty 10

## 2022-07-14 NOTE — Progress Notes (Signed)
Nutrition Note  RD consulted for nutrition education regarding diet instructions s/p gastric fundoplication (pureed diet for 2-3 weeks post-op).    RD provided "IDDSI Level 4 Pureed [place]) Nutrition Therapy" handout from the Academy of Nutrition and Dietetics. Reviewed patient's dietary recall. Provided examples on ways to modify foods on pureed diet. Encouraged pt to blend foods using a blender or food processor and strain to obtain desired consistency (no lumps). Also encouraged use of commercial pureed foods and oral nutritional supplements such as Ensure to provide adequate calories and protein. Discussed how to modify recipes to add calories and protein.    RD discussed why it is important for patient to adhere to diet recommendations and discussed possibility of diet advancement upon MD follow-up. Teach back method used.   Expect good compliance.   Body mass index is 32.78 kg/m. Pt meets criteria for obesity based on current BMI.   Current diet order is Dysphagia 1, just advanced. Tolerated clears. Labs and medications reviewed. No further nutrition interventions warranted at this time. If additional nutrition issues arise, please re-consult RD.    Tilda Franco, MS, RD, LDN Inpatient Clinical Dietitian Contact information available via Amion

## 2022-07-14 NOTE — Progress Notes (Signed)
  Transition of Care [facility]) Screening Note   Patient Details  Name: Angela Hartman Date of Birth: 04/12/1980   Transition of Care [facility]) CM/SW Contact:    Adrian Prows, RN Phone Number: 07/14/2022, 9:12 AM    Transition of Care Department [facility]) has reviewed patient and no TOC needs have been identified at this time. We will continue to monitor patient advancement through interdisciplinary progression rounds. If new patient transition needs arise, please place a TOC consult.

## 2022-07-14 NOTE — Discharge Summary (Signed)
Physician Discharge Summary    Patient ID: Angela Hartman MRN: [id number] DOB/AGE: [birth year]  42 y.o.  Patient Care Team: Jeani Sow, MD as PCP - General (Family Medicine) Karie Soda, MD as Consulting Physician (General Surgery) Napoleon Form, MD as Consulting Physician (Gastroenterology)  Admit date: 07/13/2022  Discharge date: 07/14/2022  Hospital Stay = 0 days    Discharge Diagnoses:  Principal Problem:   Hiatal hernia with GERD and esophagitis Active Problems:   Smoking   Chronic constipation   Peptic duodenitis   Incarcerated hiatal hernia   Pill dysphagia   1 Day Post-Op  07/13/2022  POST-OPERATIVE DIAGNOSIS:  PARAESOPHAGEAL HIATAL HERNIA REFRACTORY TO MEDICAL MANAGEMENT   PROCEDURE:   1. ROBOTIC reduction of paraesophageal hiatal hernia 2. Type II mediastinal dissection. 3. Primary repair of hiatal hernia over pledgets.  4. Anterior & posterior gastropexy. 5. Toupet (270 degree partial posterior x 3cm)  fundoplication 6. Mesh reinforcement with absorbable mesh   SURGEON:  [name], MD   OR FINDINGS:    Moderate-sized paraesophageal hiatal hernia with 33% of the stomach in the mediastinum.  There was a 9 x 6 cm hiatal defect.   It is a primary repair over pledgets.  Mesh reinforcement was used with GORE BIO-A mesh, a biosynthetic web scaffold made of 67% polyglycolic acid (PGA): 33% trimethylene carbonate (TMC).   The patient has a Toupet (270 degree partial posterior x 4cm)  fundoplication.  The patient has had anterior and posterior gastropexy.    Consults: Case Management / Social Work, Nutrition, and Anesthesia  Hospital Course:   The patient underwent the surgery above.  Postoperatively, the patient gradually mobilized and advanced to a pureed diet.  Pain and other symptoms were treated aggressively.    By the time of discharge, the patient was walking well the hallways, eating food, having flatus.  Pain was  well-controlled on an oral medications.  Based on meeting discharge criteria and continuing to recover, I felt it was safe for the patient to be discharged from the hospital to further recover with close followup. Postoperative recommendations were discussed in detail.  They are written as well.  Discharged Condition: good  Discharge Exam: Blood pressure 119/69, pulse 61, temperature 98.7 F (37.1 C), temperature source Oral, resp. rate 18, height 5\' 7"  (1.702 m), weight 97.5 kg, last menstrual period 04/05/2016, SpO2 93 %.  General: Pt awake/alert/oriented x4 in No acute distress Eyes: PERRL, normal EOM.  Sclera clear.  No icterus Neuro: CN II-XII intact w/o focal sensory/motor deficits. Lymph: No head/neck/groin lymphadenopathy Psych:  No delerium/psychosis/paranoia HENT: Normocephalic, Mucus membranes moist.  No thrush Neck: Supple, No tracheal deviation Chest:  No chest wall pain w good excursion CV:  Pulses intact.  Regular rhythm MS: Normal AROM mjr joints.  No obvious deformity Abdomen: Soft.  Nondistended.  Mildly tender at incisions only.  No evidence of peritonitis.  No incarcerated hernias. Ext:  SCDs BLE.  No mjr edema.  No cyanosis Skin: No petechiae / purpura   Disposition:    Follow-up Information     Karie Soda, MD Follow up on 08/08/2022.   Specialties: General Surgery, Colon and Rectal Surgery Why: To follow up after your operation Contact information: [address] [place] [address] [phone]                 Discharge disposition: 01-Home or Self Care       Discharge Instructions     Call MD  for:   Complete by: As directed    Temperature > 101.57F   Call MD for:  extreme fatigue   Complete by: As directed    Call MD for:  hives   Complete by: As directed    Call MD for:  persistant nausea and vomiting   Complete by: As directed    Call MD for:  redness, tenderness, or signs of infection (pain, swelling, redness,  odor or green/yellow discharge around incision site)   Complete by: As directed    Call MD for:  severe uncontrolled pain   Complete by: As directed    Diet general   Complete by: As directed    SEE ESOPHAGEAL SURGERY DIET INSTRUCTIONS  We using usually start you out on a pureed (blenderized) diet. Expect some sticking with swallowing over the next 1-2 months.   This is due to swelling around your esophagus at the wrap & hiatal diaphragm repair.  It will gradually ease off over the next few months.   Discharge instructions   Complete by: As directed    Please see discharge instruction sheets.   Also refer to any handouts/printouts that may have been given from the [facility] surgery office (if you visited [place] there before surgery) Please call our office if you have any questions or concerns [phone]   Driving Restrictions   Complete by: As directed    No driving until off narcotics and can safely swerve away without pain during an emergency   Increase activity slowly   Complete by: As directed    Lifting restrictions   Complete by: As directed    Avoid heavy lifting initially, <20 pounds at first.   Do not push through pain.   You have no specific weight limit: If it hurts to do, DON'T DO IT.    If you feel no pain, you are not injuring anything.  Pain will protect you from injury.   Coughing and sneezing are far more stressful to your incision than any lifting.   Avoid resuming heavy lifting (>50 pounds) or other intense activity until off all narcotic pain medications.   When want to exercise more, give yourself 2 weeks to gradually get back to full intense exercise/activity.   May shower / Bathe   Complete by: As directed    SHOWER EVERY DAY.  It is fine for dressings or wounds to be washed/rinsed.  Use gentle soap & water.  This will help the incisions and/or wounds get clean & minimize infection.   May walk up steps   Complete by: As directed    Remove dressing in 72 hours    Complete by: As directed    You have closed incisions: Shower and bathe over these incisions with soap and water every day.  It is OK to wash over the dressings: they are waterproof. Remove all surgical dressings on postoperative day #3.  You do not need to replace dressings over the closed incisions unless you feel more comfortable with a Band-Aid covering it.   TEGADERM:  You have clear gauze band-aid dressings over your closed incision(s).  Remove the dressings 3 days after surgery.  Please call our office [phone] if you have further questions.   Sexual Activity Restrictions   Complete by: As directed    Sexual activity as tolerated.  Do not push through pain.  Pain will protect you from injury.   Walk with assistance   Complete by: As directed    Walk over  an hour a day.  May use a walker/cane/companion to help with balance and stamina.       Allergies as of 07/14/2022       Reactions   Other Other (See Comments)   CAN ONLY TAKE LIQUID MEDICATION- NO PILLS   Penicillins Hives, Other (See Comments)   Rash   Tape Rash        Medication List     STOP taking these medications    famotidine 40 MG/5ML suspension Commonly known as: PEPCID       TAKE these medications    esomeprazole 20 MG capsule Commonly known as: NEXIUM Take 40 mg by mouth daily.   Nicotrol NS 10 MG/ML Soln Generic drug: Nicotine Place 0.1 mLs (1 mg total) into the nose as directed. 1 spray each nostril every to 1 hr as needed   ondansetron 4 MG tablet Commonly known as: ZOFRAN Take 1 tablet (4 mg total) by mouth every 8 (eight) hours as needed for nausea.   oxyCODONE-acetaminophen 5-325 MG tablet Commonly known as: Percocet Take 1 tablet by mouth every 6 (six) hours as needed for severe pain.   Tylenol Dissolve Packs 500 MG Pack Generic drug: Acetaminophen Take 500-1,000 mg by mouth 2 (two) times daily as needed (for headaches).        Significant Diagnostic  Studies:  No results found for this or any previous visit (from the past 72 hour(s)).  DG ESOPHAGUS W SINGLE CM (SOL OR THIN BA)  Result Date: 07/14/2022 CLINICAL DATA:  History of hiatal hernia and GERD 1 day status post robotic hiatal hernia repair with Toupet fundoplication. Patient referred for water-soluble esophagram to rule out leak/obstruction. EXAM: WATER-SOLUBLE ESOPHAGRAM STUDY TECHNIQUE: Single contrast examination was performed using diluted Omnipaque 300. This exam was performed by Alex Gardener, NP, and was supervised and interpreted by Carey Bullocks, MD. FLUOROSCOPY: Radiation Exposure Index (as provided by the fluoroscopic device): 38.30 mGy Kerma COMPARISON:  Preoperative chest CT 05/24/2022 FINDINGS: Scout erect images demonstrate bibasilar atelectasis with probable small pleural effusions. The stomach is distended with an air-fluid level. A surgical drain projects over the GE junction. Swallowing: Appears normal. No vestibular penetration or aspiration seen. Pharynx: Unremarkable. Esophagus: No focal mucosal abnormalities are identified. There are postsurgical changes at the gastroesophageal junction with mild luminal narrowing. Contrast passes into the stomach. No evidence of contrast leak. Esophageal motility: Mild tertiary contractions. Hiatal Hernia: No significant residual hernia. Gastroesophageal reflux: None visualized. Ingested 13mm barium tablet: 13 mm barium tablet not given Other: None. IMPRESSION: 1. Interval hiatal hernia repair and fundoplication without evidence of complication. There is no evidence of contrast leak. 2. Mild narrowing at the gastroesophageal junction, consistent with postoperative edema. No obstruction or significant dysmotility. Electronically Signed   By: Carey Bullocks M.D.   On: 07/14/2022 09:10    Past Medical History:  Diagnosis Date   Abnormal Pap smear of cervix    has had several   Anemia    Anxiety    Chest pain 2018   Went to ER, EKG  normal, due to anxiety per patient   Chronic constipation 06/06/2022   Gastroesophageal reflux disease 02/23/2022   Headache    migraines   History of hiatal hernia    Ulcer     Past Surgical History:  Procedure Laterality Date   ABDOMINAL HYSTERECTOMY     CHOLECYSTECTOMY     CYSTOCELE REPAIR N/A 05/06/2016   Procedure: Repair Vaginal Cuff Dehisance,;  Surgeon: Carrington Clamp, MD;  Location: [facility];  Service: Gynecology;  Laterality: N/A;   CYSTOSCOPY N/A 04/20/2016   Procedure: CYSTOSCOPY;  Surgeon: Carrington Clamp, MD;  Location: [facility];  Service: Gynecology;  Laterality: N/A;   CYSTOSCOPY  05/06/2016   Procedure: CYSTOSCOPY;  Surgeon: Carrington Clamp, MD;  Location: [facility];  Service: Gynecology;;   ESOPHAGEAL MANOMETRY N/A 05/04/2022   Procedure: ESOPHAGEAL MANOMETRY (EM);  Surgeon: Napoleon Form, MD;  Location: [facility];  Service: Gastroenterology;  Laterality: N/A;   ROBOTIC ASSISTED TOTAL HYSTERECTOMY WITH BILATERAL SALPINGO OOPHERECTOMY N/A 04/20/2016   Procedure: ROBOTIC ASSISTED TOTAL HYSTERECTOMY WITH BILATERAL SALPINGO OOPHORECTOMY;  Surgeon: Carrington Clamp, MD;  Location: [facility];  Service: Gynecology;  Laterality: N/A;   UPPER GASTROINTESTINAL ENDOSCOPY     WISDOM TOOTH EXTRACTION      Social History   Socioeconomic History   Marital status: Married    Spouse name: Not on file   Number of children: 2   Years of education: Not on file   Highest education level: Not on file  Occupational History   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    Social Determinants of Health   Financial Resource Strain: Not on file  Food Insecurity: Not on file  Transportation Needs: Not on file  Physical Activity: Not on file  Stress: Not on file  Social Connections: Not on file  Intimate Partner Violence: Not on file    Family History  Problem Relation Age of Onset   Hypertension Mother    Heart disease Father 74       CAD   Heart attack Father    Diabetes Paternal Grandmother    Esophageal cancer Neg Hx    Colon cancer Neg Hx    Rectal cancer Neg Hx    Stomach cancer Neg Hx     Current Facility-Administered Medications  Medication Dose Route Frequency Provider Last Rate Last Admin   0.9 %  sodium chloride infusion  250 mL Intravenous PRN Karie Soda, MD       acetaminophen (TYLENOL) 160 MG/5ML solution 1,000 mg  1,000 mg Oral Q6H Karie Soda, MD   1,000 mg at 07/13/22 2256   alum & mag hydroxide-simeth (MAALOX/MYLANTA) 200-200-20 MG/5ML suspension 30 mL  30 mL Oral Q6H PRN Karie Soda, MD       bisacodyl (DULCOLAX) suppository 10 mg  10 mg Rectal Daily PRN Karie Soda, MD       dexamethasone (DECADRON) injection 4 mg  4 mg Intravenous Catha Gosselin, MD   4 mg at 07/14/22 0925   diphenhydrAMINE (BENADRYL) 12.5 MG/5ML elixir 12.5 mg  12.5 mg Oral Q6H PRN Karie Soda, MD       Or   diphenhydrAMINE (BENADRYL) injection 12.5 mg  12.5 mg Intravenous Q6H PRN Karie Soda, MD       enoxaparin (LOVENOX) injection 40 mg  40 mg Subcutaneous Q24H Karie Soda, MD   40 mg at 07/14/22 0811   gabapentin (NEURONTIN) capsule  300 mg  300 mg Oral BID Karie Soda, MD   300 mg at 07/14/22 1610   HYDROmorphone (DILAUDID) injection 0.5-1 mg  0.5-1 mg Intravenous Q4H PRN Karie Soda, MD   1 mg at 07/14/22 0216   lactated ringers bolus 1,000 mL  1,000 mL Intravenous Q8H PRN Karie Soda, MD       lip balm (CARMEX) ointment   Topical BID Karie Soda, MD   Given at 07/14/22 0930   magic mouthwash  15 mL Oral QID PRN Karie Soda,  MD       magnesium hydroxide (MILK OF MAGNESIA) suspension 30 mL  30 mL Oral Daily PRN Karie Soda, MD       menthol-cetylpyridinium (CEPACOL) lozenge 3 mg  1 lozenge Oral PRN Karie Soda, MD       methocarbamol (ROBAXIN) 1,000 mg in dextrose 5 % 100 mL IVPB  1,000 mg Intravenous Q6H PRN Karie Soda, MD 220 mL/hr at 07/14/22 0648 1,000 mg at 07/14/22 0648   methocarbamol (ROBAXIN) tablet 500 mg  500 mg Oral Q6H PRN Karie Soda, MD       metoCLOPramide (REGLAN) injection 5-10 mg  5-10 mg Intravenous Q8H PRN Karie Soda, MD       metoprolol tartrate (LOPRESSOR) injection 5 mg  5 mg Intravenous Q6H PRN Karie Soda, MD       ondansetron (ZOFRAN-ODT) disintegrating tablet 4 mg  4 mg Oral Q6H PRN Karie Soda, MD       Or   ondansetron [facility]) injection 4 mg  4 mg Intravenous Q6H PRN Karie Soda, MD   4 mg at 07/13/22 2141   oxyCODONE (ROXICODONE) 5 MG/5ML solution 5-10 mg  5-10 mg Oral Q4H PRN Karie Soda, MD   10 mg at 07/14/22 9604   Or   oxyCODONE (Oxy IR/ROXICODONE) immediate release tablet 5-10 mg  5-10 mg Oral Q4H PRN Karie Soda, MD       pantoprazole (PROTONIX) EC tablet 40 mg  40 mg Oral Daily Karie Soda, MD       pantoprazole (PROTONIX) injection 40 mg  40 mg Intravenous Once Simaan, Elizabeth S, PA-C       phenol (CHLORASEPTIC) mouth spray 2 spray  2 spray Mouth/Throat PRN Karie Soda, MD       prochlorperazine (COMPAZINE) tablet 10 mg  10 mg Oral Q6H PRN Karie Soda, MD       Or   prochlorperazine (COMPAZINE) injection 5-10 mg  5-10 mg Intravenous Q6H PRN Karie Soda, MD       simethicone (MYLICON) 40 MG/0.6ML suspension 80 mg  80 mg Oral QID PRN Karie Soda, MD   80 mg at 07/14/22 0216   simethicone (MYLICON) chewable tablet 40 mg  40 mg Oral Q6H PRN Karie Soda, MD       sodium chloride flush (NS) 0.9 % injection 3 mL  3 mL Intravenous Catha Gosselin, MD   3 mL at 07/14/22 0929   sodium chloride flush (NS) 0.9 % injection 3 mL  3 mL Intravenous PRN  Karie Soda, MD       traMADol Janean Sark) tablet 50-100 mg  50-100 mg Oral Q6H PRN Karie Soda, MD         Allergies  Allergen Reactions   Other Other (See Comments)    CAN ONLY TAKE LIQUID MEDICATION- NO PILLS   Penicillins Hives and Other (See Comments)    Rash   Tape Rash    Signed:   [name], MD, FACS,  [facility] Esophageal, Gastrointestinal & Colorectal Surgery Robotic and Minimally Invasive Surgery  [facility] [address], [address] [place], [place] [address] [phone] Fax [phone] Main  CONTACT INFORMATION:  Weekday (9AM-5PM): Call [facility] main office at [phone]  Weeknight (5PM-9AM) or Weekend/Holiday: Check [email or web address] (password " [email or web address]") for General Surgery [facility] coverage  (Please, do not use SecureChat as it is not reliable communication to reach operating surgeons for immediate patient care given surgeries/outpatient duties/clinic/cross-coverage/off post-call which would lead to a delay in care.  Epic staff messaging available for outptient concerns, but may not be answered for 48 hours or more).     07/14/2022, 10:10 AM

## 2022-07-14 NOTE — Progress Notes (Signed)
Angela Hartman [id number] 19-Dec-1980  CARE TEAM:  PCP: Jeani Sow, MD  Outpatient Care Team: Patient Care Team: Jeani Sow, MD as PCP - General (Family Medicine) Karie Soda, MD as Consulting Physician (General Surgery) Napoleon Form, MD as Consulting Physician (Gastroenterology)  Inpatient Treatment Team: Treatment Team: Attending Provider: Karie Soda, MD; Registered Nurse: Renard Hamper, RN   Problem List:   Principal Problem:   Hiatal hernia with GERD and esophagitis Active Problems:   Smoking   Chronic constipation   Peptic duodenitis   Incarcerated hiatal hernia   Pill dysphagia   1 Day Post-Op  07/13/2022  POST-OPERATIVE DIAGNOSIS:  PARAESOPHAGEAL HIATAL HERNIA REFRACTORY TO MEDICAL MANAGEMENT   PROCEDURE:   1. ROBOTIC reduction of paraesophageal hiatal hernia 2. Type II mediastinal dissection. 3. Primary repair of hiatal hernia over pledgets.  4. Anterior & posterior gastropexy. 5. Toupet (270 degree partial posterior x 3cm)  fundoplication 6. Mesh reinforcement with absorbable mesh   SURGEON:  [name], MD   OR FINDINGS:    Moderate-sized paraesophageal hiatal hernia with 33% of the stomach in the mediastinum.  There was a 9 x 6 cm hiatal defect.   It is a primary repair over pledgets.  Mesh reinforcement was used with GORE BIO-A mesh, a biosynthetic web scaffold made of 67% polyglycolic acid (PGA): 33% trimethylene carbonate (TMC).   The patient has a Toupet (270 degree partial posterior x 4cm)  fundoplication.  The patient has had anterior and posterior gastropexy.    Assessment  Stabilizing  [facility] Stay = 0 days)  Plan:  Tolerating clears relatively well hopeful sign.  Some mild back pain is not surprising.  Esophagram this morning.  If no evidence of leak nor obstruction, advance to pured dysphagia 1 diet and most likely discharge later today versus tomorrow.  Patient really wants to go home  today if possible.  Will try and write and set that up to happen.  If swallow eval shows obstruction, keep on Decadron twice daily for 6 hours with diuresis and regroup.  Tried to encourage smoking.  She notes she quit last week and she is hoping to continue to do it.  I noted that was really important and I am glad she is starting to get that done.  Pill dysphagia.  She is used to crushing her pills.  Peptic duodenitis.  Hopefully quitting smoking will help.  Suspect she will need to stay on some PPI for another 3 months, perhaps indefinitely..  Looks like she tends to use Nexium.  -VTE prophylaxis- SCDs, etc  -mobilize as tolerated to help recovery  Disposition:  Disposition:  The patient is from: Home  Anticipate discharge to:  Home  Anticipated Date of Discharge is:  May 23,2024    Barriers to discharge:  Need for inpatient procedure/study and Pending Clinical improvement (more likely than not)  Patient currently is NOT MEDICALLY STABLE for discharge from the hospital from a surgery standpoint.      I reviewed last 24 h vitals and pain scores, last 48 h intake and output, last 24 h labs and trends, and last 24 h imaging results. I have reviewed this patient's available data, including medical history, events of note, test results, etc as part of my evaluation.  A significant portion of that time was spent in counseling.  Care during the described time interval was provided by me.  This care required straight-forward level of medical decision making.  07/14/2022  Subjective: (Chief complaint)  Patient tolerating liquids.  Some complaints of back pain but not severe.  Husband in room.  Denies any nausea or retching.  Want to let me know she had quit smoking this week and is hoping to stay abstinent.  Objective:  Vital signs:  Vitals:   07/13/22 1730 07/13/22 2047 07/14/22 0037 07/14/22 0533  BP: 120/68 116/69 119/80 119/69  Pulse: 70 61 62 61  Resp: 18 18 18  18   Temp: 98.4 F (36.9 C) 98.2 F (36.8 C) 98.2 F (36.8 C) 98.7 F (37.1 C)  TempSrc: Oral Oral Oral Oral  SpO2: 99% 92% 93% 93%  Weight:      Height:        Last BM Date : 07/12/22  Intake/Output   Yesterday:  05/22 0701 - 05/23 0700 In: 4355.8 [P.O.:720; I.V.:3165.8; IV Piggyback:470] Out: 1635 [Urine:1600; Drains:15; Blood:20] This shift:  Total I/O In: 1115 [P.O.:420; I.V.:585; IV Piggyback:110] Out: 1200 [Urine:1200]  Bowel function:  Flatus: YES  BM:  No  Drain: Serosanguinous   Physical Exam:  General: Pt awake/alert in no acute distress Eyes: PERRL, normal EOM.  Sclera clear.  No icterus Neuro: CN II-XII intact w/o focal sensory/motor deficits. Lymph: No head/neck/groin lymphadenopathy Psych:  No delerium/psychosis/paranoia.  Oriented x 4 HENT: Normocephalic, Mucus membranes moist.  No thrush Neck: Supple, No tracheal deviation.  No obvious thyromegaly Chest: No pain to chest wall compression.  Good respiratory excursion.  No audible wheezing CV:  Pulses intact.  Regular rhythm.  No major extremity edema MS: Normal AROM mjr joints.  No obvious deformity  Abdomen: Soft.  Nondistended.  Mildly tender at incisions only.  No evidence of peritonitis.  No incarcerated hernias.  Ext:   No deformity.  No mjr edema.  No cyanosis Skin: No petechiae / purpurea.  No major sores.  Warm and dry    Results:   Cultures: No results found for this or any previous visit (from the past 720 hour(s)).  Labs: No results found for this or any previous visit (from the past 48 hour(s)).  Imaging / Studies: No results found.  Medications / Allergies: per chart  Antibiotics: Anti-infectives (From admission, onward)    Start     Dose/Rate Route Frequency Ordered Stop   07/13/22 0700  clindamycin (CLEOCIN) IVPB 900 mg       See Hyperspace for full Linked Orders Report.   900 mg 100 mL/hr over 30 Minutes Intravenous On call to O.R. 07/13/22 0654 07/13/22 0828    07/13/22 0700  gentamicin (GARAMYCIN) 380 mg in dextrose 5 % 100 mL IVPB       See Hyperspace for full Linked Orders Report.   5 mg/kg  76 kg (Adjusted) 109.5 mL/hr over 60 Minutes Intravenous On call to O.R. 07/13/22 1610 07/13/22 0844         Note: Portions of this report may have been transcribed using voice recognition software. Every effort was made to ensure accuracy; however, inadvertent computerized transcription errors may be present.   Any transcriptional errors that result from this process are unintentional.    [name], MD, FACS, MASCRS Esophageal, Gastrointestinal & Colorectal Surgery Robotic and Minimally Invasive Surgery  [facility] [address] [place], [place] [address] [phone] Fax [phone] Main  CONTACT INFORMATION:  Weekday (9AM-5PM): Call [facility] main office at [phone]  Weeknight (5PM-9AM) or Weekend/Holiday: Check [email or web address] (password " [email or web address]") for General Surgery [facility] coverage  (  Please, do not use SecureChat as it is not reliable communication to reach operating surgeons for immediate patient care given surgeries/outpatient duties/clinic/cross-coverage/off post-call which would lead to a delay in care.  Epic staff messaging available for outptient concerns, but may not be answered for 48 hours or more).     07/14/2022  6:19 AM

## 2022-07-14 NOTE — Plan of Care (Signed)
Patient is stable for discharge, discharge instructions have been given. All questions answered, patient is discharged to home with spouse.  

## 2022-07-19 ENCOUNTER — Telehealth: Payer: Self-pay

## 2022-07-19 NOTE — Transitions of Care (Post Inpatient/ED Visit) (Signed)
   07/19/2022  Name: Carnie Wunderlich MRN: [id number] DOB: Jul 01, 1980  Today's TOC FU Call Status: Today's TOC FU Call Status:: Unsuccessul Call (1st Attempt) Unsuccessful Call (1st Attempt) Date: 07/19/22  Attempted to reach the patient regarding the most recent Inpatient/ED visit.  Follow Up Plan: Additional outreach attempts will be made to reach the patient to complete the Transitions of Care (Post Inpatient/ED visit) call.   Signature Agnes Lawrence, CMA (AAMA)  [facility]- [facility] Program [phone]

## 2022-07-21 NOTE — Transitions of Care (Post Inpatient/ED Visit) (Signed)
   07/21/2022  Name: Angela Hartman MRN: [id number] DOB: 09-14-1980  Today's TOC FU Call Status: Today's TOC FU Call Status:: Unsuccessful Call (2nd Attempt) Unsuccessful Call (1st Attempt) Date: 07/19/22 Unsuccessful Call (2nd Attempt) Date: 07/21/22  Attempted to reach the patient regarding the most recent Inpatient/ED visit.  Follow Up Plan: Additional outreach attempts will be made to reach the patient to complete the Transitions of Care (Post Inpatient/ED visit) call.   Signature Agnes Lawrence, CMA (AAMA)  [facility] [phone]

## 2022-07-25 NOTE — Transitions of Care (Post Inpatient/ED Visit) (Signed)
   07/25/2022  Name: Parys Sabatini MRN: [id number] DOB: 01-17-1981  Today's TOC FU Call Status: Today's TOC FU Call Status:: Successful TOC FU Call Competed Unsuccessful Call (1st Attempt) Date: 07/19/22 Unsuccessful Call (2nd Attempt) Date: 07/21/22 Unsuccessful Call (3rd Attempt) Date: 07/25/22 [facility] FU Call Complete Date: 07/25/22  Attempted to reach the patient regarding the most recent Inpatient/ED visit.  Follow Up Plan: No further outreach attempts will be made at this time. We have been unable to contact the patient.  Signature Agnes Lawrence, CMA (AAMA)  [facility] [phone]

## 2022-10-21 ENCOUNTER — Telehealth: Payer: Self-pay | Admitting: Gastroenterology

## 2022-10-21 NOTE — Telephone Encounter (Signed)
Patients spouse called requesting to speak with a nurse said she has issues with eating and her stomach.

## 2022-10-21 NOTE — Telephone Encounter (Signed)
Patient reports diarrhea since her surgery 07/13/22. Diarrhea occurs with anything she eats. It is urgent and difficult to control. No blood has been seen with the stools. She is avoiding milk. She has tried Pepto-Bismol and Imodium without relief. No stool studies have been done.

## 2022-10-26 NOTE — Telephone Encounter (Signed)
Please advise patient to come in for stool studies, will need to check C. difficile and stool culture.  If negative for acute infection, she can use Imodium as needed.  Agree with lactose-free diet, avoid sugary drinks or soda Please schedule follow-up visit with me or APP soon, if has persistent diarrhea, will plan to do colonoscopy to exclude microscopic colitis and will need to evaluate for other etiology of chronic diarrhea

## 2022-10-27 NOTE — Telephone Encounter (Signed)
Called the patient. No answer. Left a message of my call and asked she call me back.

## 2022-11-03 ENCOUNTER — Other Ambulatory Visit: Payer: Self-pay

## 2022-11-03 DIAGNOSIS — R1084 Generalized abdominal pain: Secondary | ICD-10-CM

## 2022-11-03 DIAGNOSIS — R197 Diarrhea, unspecified: Secondary | ICD-10-CM

## 2022-11-03 NOTE — Telephone Encounter (Signed)
Called the patient. No answer. Left her a voicemail to call if she is continuing to have symptoms and the labs orders can be put in. Otherwise, hope she is doing better.

## 2022-11-03 NOTE — Telephone Encounter (Signed)
Patient calls. She reports she is "spending a lot of time on the toilet" and "something is going on." She agrees to have the stool studies. I will monitor the schedule for an appointment with Dr Lavon Paganini or an APP. Unable to schedule an appointment at this time.

## 2022-11-04 NOTE — Telephone Encounter (Signed)
Patient is a night shift worker. She has viewed her appointment in My Chart.

## 2022-11-08 ENCOUNTER — Other Ambulatory Visit: Payer: 59

## 2022-11-11 ENCOUNTER — Ambulatory Visit: Payer: 59 | Admitting: Physician Assistant

## 2022-11-14 ENCOUNTER — Other Ambulatory Visit: Payer: 59

## 2022-11-14 DIAGNOSIS — R197 Diarrhea, unspecified: Secondary | ICD-10-CM

## 2022-11-14 DIAGNOSIS — R1084 Generalized abdominal pain: Secondary | ICD-10-CM

## 2022-11-15 LAB — CLOSTRIDIUM DIFFICILE BY PCR: Toxigenic C. Difficile by PCR: NEGATIVE

## 2022-11-17 LAB — STOOL CULTURE: E coli, Shiga toxin Assay: NEGATIVE

## 2022-11-18 ENCOUNTER — Encounter: Payer: Self-pay | Admitting: Gastroenterology

## 2022-11-29 ENCOUNTER — Other Ambulatory Visit: Payer: Self-pay | Admitting: Obstetrics and Gynecology

## 2022-11-29 DIAGNOSIS — R928 Other abnormal and inconclusive findings on diagnostic imaging of breast: Secondary | ICD-10-CM

## 2022-11-30 ENCOUNTER — Encounter: Payer: Self-pay | Admitting: Physician Assistant

## 2022-11-30 ENCOUNTER — Ambulatory Visit (INDEPENDENT_AMBULATORY_CARE_PROVIDER_SITE_OTHER): Payer: 59 | Admitting: Physician Assistant

## 2022-11-30 VITALS — BP 118/68 | HR 88 | Ht 67.0 in | Wt 209.0 lb

## 2022-11-30 DIAGNOSIS — R109 Unspecified abdominal pain: Secondary | ICD-10-CM

## 2022-11-30 DIAGNOSIS — R1084 Generalized abdominal pain: Secondary | ICD-10-CM | POA: Diagnosis not present

## 2022-11-30 DIAGNOSIS — R197 Diarrhea, unspecified: Secondary | ICD-10-CM

## 2022-11-30 MED ORDER — NA SULFATE-K SULFATE-MG SULF 17.5-3.13-1.6 GM/177ML PO SOLN: 1.0000 | Freq: Once | ORAL | 0 refills | Status: AC

## 2022-11-30 MED ORDER — HYOSCYAMINE SULFATE 0.125 MG SL SUBL: 0.1250 mg | SUBLINGUAL_TABLET | Freq: Four times a day (QID) | SUBLINGUAL | 1 refills | Status: DC | PRN

## 2022-11-30 MED ORDER — HYOSCYAMINE SULFATE 0.125 MG SL SUBL: 0.1250 mg | SUBLINGUAL_TABLET | Freq: Four times a day (QID) | SUBLINGUAL | 0 refills | Status: DC

## 2022-11-30 NOTE — Progress Notes (Signed)
Chief Complaint: Diarrhea  HPI:    Mrs. Coffie is a 42 year old female, known to Dr. Lavon Paganini, with a past medical history as listed below including anxiety, GERD and chronic constipation, who was referred to me by Jeani Sow, MD for a complaint of diarrhea.      04/04/2019 EGD with grade C esophagitis and large hiatal hernia measuring about 6 cm as well as a single 5 mm AVM in the gastric body that was nonbleeding.    02/23/2022 office visit with Doug Sou, at that time discussed recent ER visit for upper abdominal pain she was taking Omeprazole 40 mg daily.  CT scan unremarkable.  She was given Pepcid suspension and Carafate as well as Bentyl.  Describes she could not swallow pills.  Since then continued with a lot of acid reflux.  At that time had been opening Omeprazole capsules and using 40 mg daily.  That time changed to Prevacid SoluTab 30 mg twice a day.  Also continued on Pepcid suspension at bedtime.  Prescribed Carafate tablet to make a slurry for 1 to 2 months.  Recommended repeat EGD in mid to end of February.    04/13/2022 EGD with esophageal ulcers, 6 cm hiatal hernia with a few Cameron ulcers and erythematous duodenopathy.  Continued on Prevacid twice daily and Carafate 4 times daily.  At that time recommended esophageal manometry.  Referred to [facility] for hiatal hernia repair/Nissen fundoplication.    05/04/2022 esophageal manometry with normal relaxation of the EG and no significant esophageal peristaltic abnormality.    08/08/2022 patient underwent Nissen fundoplication.    10/21/2022 patient called in with diarrhea.  At that time recommended stool studies.  Also discussed colonoscopy to exclude microscopic colitis.    11/14/2022 C. difficile and stool culture negative.    Today, the patient tells me ever since her Nissen fundoplication in June she has had a change in her regular bowel habits to diarrhea.  Describes that about 30 minutes after eating anything she has to rush to the  bathroom for loose stool.  She has tried over-the-counter Kaopectate every 20 to 30 minutes but this has not helped at all.  Again she has trouble taking actual pills so she has not tried anything else.  Growing frustrated with ongoing symptoms.  No blood in her stool or weight loss.    Denies fever or chills.  Past Medical History:  Diagnosis Date   Abnormal Pap smear of cervix    has had several   Anemia    Anxiety    Chest pain 2018   Went to ER, EKG normal, due to anxiety per patient   Chronic constipation 06/06/2022   Gastroesophageal reflux disease 02/23/2022   Headache    migraines   History of hiatal hernia    Ulcer     Past Surgical History:  Procedure Laterality Date   ABDOMINAL HYSTERECTOMY     CHOLECYSTECTOMY     CYSTOCELE REPAIR N/A 05/06/2016   Procedure: Repair Vaginal Cuff Dehisance,;  Surgeon: Carrington Clamp, MD;  Location: [facility];  Service: Gynecology;  Laterality: N/A;   CYSTOSCOPY N/A 04/20/2016   Procedure: CYSTOSCOPY;  Surgeon: Carrington Clamp, MD;  Location: [facility];  Service: Gynecology;  Laterality: N/A;   CYSTOSCOPY  05/06/2016   Procedure: CYSTOSCOPY;  Surgeon: Carrington Clamp, MD;  Location: [facility];  Service: Gynecology;;   ESOPHAGEAL MANOMETRY N/A 05/04/2022   Procedure: ESOPHAGEAL MANOMETRY (EM);  Surgeon: Napoleon Form, MD;  Location: [facility];  Service:  Gastroenterology;  Laterality: N/A;   ROBOTIC ASSISTED TOTAL HYSTERECTOMY WITH BILATERAL SALPINGO OOPHERECTOMY N/A 04/20/2016   Procedure: ROBOTIC ASSISTED TOTAL HYSTERECTOMY WITH BILATERAL SALPINGO OOPHORECTOMY;  Surgeon: Carrington Clamp, MD;  Location: [facility];  Service: Gynecology;  Laterality: N/A;   UPPER GASTROINTESTINAL ENDOSCOPY     WISDOM TOOTH EXTRACTION     XI ROBOTIC ASSISTED PARAESOPHAGEAL HERNIA REPAIR N/A 07/13/2022   Procedure: ROBOTIC PARAESOPHAGEAL HIATAL HERNIA REPAIR WITH TOUPE FUNDOPLICATION;  Surgeon: Karie Soda, MD;  Location: [facility];  Service: General;   Laterality: N/A;  GEN w/ [facility] LOCAL    Current Outpatient Medications  Medication Sig Dispense Refill   Nicotine (NICOTROL NS) 10 MG/ML SOLN Place 0.1 mLs (1 mg total) into the nose as directed. 1 spray each nostril every to 1 hr as needed (Patient not taking: Reported on 06/29/2022) 10 mL 1   esomeprazole (NEXIUM) 20 MG capsule Take 40 mg by mouth daily.     ondansetron (ZOFRAN) 4 MG tablet Take 1 tablet (4 mg total) by mouth every 8 (eight) hours as needed for nausea. 8 tablet 5   oxyCODONE-acetaminophen (PERCOCET) 5-325 MG tablet Take 1 tablet by mouth every 6 (six) hours as needed for severe pain. 20 tablet 0   TYLENOL DISSOLVE PACKS 500 MG PACK Take 500-1,000 mg by mouth 2 (two) times daily as needed (for headaches).     No current facility-administered medications for this visit.    Allergies as of 11/30/2022 - Review Complete 07/13/2022  Allergen Reaction Noted   Other Other (See Comments) 04/12/2016   Penicillins Hives and Other (See Comments) 06/19/2012   Tape Rash 01/19/2022    Family History  Problem Relation Age of Onset   Hypertension Mother    Heart disease Father 69       CAD   Heart attack Father    Diabetes Paternal Grandmother    Esophageal cancer Neg Hx    Colon cancer Neg Hx    Rectal cancer Neg Hx    Stomach cancer Neg Hx     Social History   Socioeconomic History   Marital status: Married    Spouse name: Not on file   Number of children: 2   Years of education: Not on file   Highest education level: Not on file  Occupational History   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   Social Determinants of Health   Financial Resource Strain: Not on file  Food Insecurity: Not on file  Transportation Needs: Not on file  Physical Activity: Not on file  Stress: Not on file  Social Connections: Not on file  Intimate Partner Violence: Not on file    Review of Systems:    Constitutional: No weight loss, fever or chills Skin: No rash Cardiovascular: No chest pain Respiratory: No SOB  Gastrointestinal: See HPI and otherwise negative Genitourinary: No dysuria  Neurological: No headache, dizziness or syncope Musculoskeletal: No new muscle or joint pain Hematologic: No bleeding Psychiatric: No history of  depression or anxiety   Physical Exam:  Vital signs: BP 118/68   Pulse 88   Ht 5\' 7"  (1.702 m)   Wt 209 lb (94.8 kg)   LMP 04/05/2016 (Exact Date)   BMI 32.73 kg/m    Constitutional:   Pleasant overweight Caucasian female appears to be in NAD, Well developed, Well nourished, alert and cooperative Respiratory: Respirations even and unlabored. Lungs clear to auscultation bilaterally.   No wheezes, crackles, or rhonchi.  Cardiovascular: Normal S1, S2. No MRG. Regular rate and rhythm. No peripheral edema, cyanosis or pallor.  Gastrointestinal:  Soft, nondistended, nontender. No rebound or guarding. Normal bowel sounds. No appreciable masses or hepatomegaly. Rectal:  Not performed.  Psychiatric: Demonstrates good judgement and reason without abnormal affect or behaviors.  RELEVANT LABS AND IMAGING: CBC    Component Value Date/Time   WBC 9.7 07/01/2022 1325   RBC 5.11 07/01/2022 1325   HGB 15.2 (H) 07/01/2022 1325   HCT 46.7 (H) 07/01/2022 1325   PLT 277 07/01/2022 1325   MCV 91.4 07/01/2022 1325   MCH 29.7 07/01/2022 1325   MCHC 32.5 07/01/2022 1325   RDW 12.6 07/01/2022 1325   LYMPHSABS 2.5 01/13/2021 1222   MONOABS 0.6 01/13/2021 1222   EOSABS 0.2 01/13/2021 1222   BASOSABS 0.0 01/13/2021 1222     CMP     Component Value Date/Time   NA 139 07/01/2022 1325   K 4.0 07/01/2022 1325   CL 106 07/01/2022 1325   CO2 26 07/01/2022 1325   GLUCOSE 99 07/01/2022 1325   BUN 17 07/01/2022 1325   CREATININE 0.91 07/01/2022 1325   CALCIUM 9.0 07/01/2022 1325   PROT 7.4 07/01/2022 1325   ALBUMIN 4.0 07/01/2022 1325   AST 32 07/01/2022 1325   ALT 35 07/01/2022 1325   ALKPHOS 90 07/01/2022 1325   BILITOT 0.6 07/01/2022 1325   GFRNONAA >60 07/01/2022 1325   GFRAA >60 12/06/2015 1952    Assessment: 1.  Diarrhea: For the past 4 months ever since Nissen fundoplication a loose bowel movement within 20 to 30 minutes of eating anything often accompanied by generalized abdominal/lower abdominal cramping which is relieved afterwards, C. difficile and stool culture negative recently; consider IBS versus microscopic colitis versus other 2.  History of Nissen fundoplication: In June of this year, reflux now controlled on just 20 mg of Nexium daily  Plan: 1.  Scheduled patient for diagnostic colonoscopy in the [facility] with Dr. Lavon Paganini for diarrhea.  Did provide the patient a detailed list of risks for the procedure and she agrees to proceed. Patient is appropriate for endoscopic procedure(s) in the ambulatory ([facility]) setting.  2.  Prescribed Hyoscyamine sulfate sublingual tabs 0.125 mg 4 times daily, 20-30 minutes before meals and at bedtime #120 with 1 refill. 3.  Patient to follow in clinic per recommendations after time of procedure.  Hyacinth Meeker, PA-C [facility] 11/30/2022, 11:02 AM  Cc: Jeani Sow, MD

## 2022-11-30 NOTE — Patient Instructions (Signed)
You have been scheduled for a colonoscopy. Please follow written instructions given to you at your visit today.   Please pick up your prep supplies at the pharmacy within the next 1-3 days.  If you use inhalers (even only as needed), please bring them with you on the day of your procedure.  DO NOT TAKE 7 DAYS PRIOR TO TEST- Trulicity (dulaglutide) Ozempic, Wegovy (semaglutide) Mounjaro (tirzepatide) Bydureon Bcise (exanatide extended release)  DO NOT TAKE 1 DAY PRIOR TO YOUR TEST Rybelsus (semaglutide) Adlyxin (lixisenatide) Victoza (liraglutide) Byetta (exanatide)  We have sent the following medications to your pharmacy for you to pick up at your convenience: Hyoscyomine 0.125 mg every 4-6 hours as needed.   _______________________________________________________  If your blood pressure at your visit was 140/90 or greater, please contact your primary care physician to follow up on this.  _______________________________________________________  If you are age 44 or older, your body mass index should be between 23-30. Your Body mass index is 32.73 kg/m. If this is out of the aforementioned range listed, please consider follow up with your Primary Care Provider.  If you are age 21 or younger, your body mass index should be between 19-25. Your Body mass index is 32.73 kg/m. If this is out of the aformentioned range listed, please consider follow up with your Primary Care Provider.   ________________________________________________________  The [facility] providers would like to encourage you to use [facility] to communicate with providers for non-urgent requests or questions.  Due to long hold times on the telephone, sending your provider a message by [facility] may be a faster and more efficient way to get a response.  Please allow 48 business hours for a response.  Please remember that this is for non-urgent requests.   It was a pleasure to see you today!  Thank you for trusting me  with your gastrointestinal care!    Hyacinth Meeker, PA-C

## 2022-11-30 NOTE — Addendum Note (Signed)
Addended by: Vicente Serene on: 11/30/2022 11:35 AM   Modules accepted: Orders

## 2022-11-30 NOTE — Addendum Note (Signed)
Addended by: Justice Britain on: 11/30/2022 11:43 AM   Modules accepted: Orders

## 2022-12-01 NOTE — Progress Notes (Signed)
Subjective:     Patient ID: Angela Hartman, female    DOB: August 13, 1980, 42 y.o.   MRN: [id number]  Chief Complaint  Patient presents with   Abdominal Issues    Had hernia repair done in May, still having diarrhea   Blood work    Would like blood work done to recheck thyroid and predm, fatigue, night sweats   Weight Loss    Discuss starting Zepbound    HPI  GI issues - Complains of diarrhea since having a hernia repair surgery in May. Has diarrhea "after eating anything". Pt is on Hyoscyamine 0.125 mg, lets dissolve on her tongue. Not taking nexium since her surgery in May. Seeing GI  Fatigue - Complains of fatigue, myalgias (bilateral legs), excessive thirst, and restless leg starting about 6 months ago. States her fatigue is not worsened/improved with exertion. Is not exercising much. Occasionally takes OTC medication to relieve her restless leg sx. No SI.   Hot flashes- She reports hot flashes and night sweats starting about a year ago. Has hx of hysterectomy many years ago. Just started on ERT  PreDM-  Is interested in starting zepbound. Discussed possible side effects, she verbalized understanding. No hx of thyroid cancer. Not taking any thyroid medication.   Quit smoking in May, cold [place].    Health Maintenance Due  Topic Date Due   DTaP/Tdap/Td (1 - Tdap) Never done    Past Medical History:  Diagnosis Date   Abnormal Pap smear of cervix    has had several   Anemia    Anxiety    Chest pain 2018   Went to ER, EKG normal, due to anxiety per patient   Chronic constipation 06/06/2022   Gastroesophageal reflux disease 02/23/2022   Headache    migraines   History of hiatal hernia    Ulcer     Past Surgical History:  Procedure Laterality Date   ABDOMINAL HYSTERECTOMY     CHOLECYSTECTOMY     CYSTOCELE REPAIR N/A 05/06/2016   Procedure: Repair Vaginal Cuff Dehisance,;  Surgeon: Carrington Clamp, MD;  Location: [facility];  Service: Gynecology;  Laterality:  N/A;   CYSTOSCOPY N/A 04/20/2016   Procedure: CYSTOSCOPY;  Surgeon: Carrington Clamp, MD;  Location: [facility];  Service: Gynecology;  Laterality: N/A;   CYSTOSCOPY  05/06/2016   Procedure: CYSTOSCOPY;  Surgeon: Carrington Clamp, MD;  Location: [facility];  Service: Gynecology;;   ESOPHAGEAL MANOMETRY N/A 05/04/2022   Procedure: ESOPHAGEAL MANOMETRY (EM);  Surgeon: Napoleon Form, MD;  Location: [facility];  Service: Gastroenterology;  Laterality: N/A;   ROBOTIC ASSISTED TOTAL HYSTERECTOMY WITH BILATERAL SALPINGO OOPHERECTOMY N/A 04/20/2016   Procedure: ROBOTIC ASSISTED TOTAL HYSTERECTOMY WITH BILATERAL SALPINGO OOPHORECTOMY;  Surgeon: Carrington Clamp, MD;  Location: [facility];  Service: Gynecology;  Laterality: N/A;   UPPER GASTROINTESTINAL ENDOSCOPY     WISDOM TOOTH EXTRACTION     XI ROBOTIC ASSISTED PARAESOPHAGEAL HERNIA REPAIR N/A 07/13/2022   Procedure: ROBOTIC PARAESOPHAGEAL HIATAL HERNIA REPAIR WITH TOUPE FUNDOPLICATION;  Surgeon: Karie Soda, MD;  Location: [facility];  Service: General;  Laterality: N/A;  GEN w/ ERAS LOCAL     Current Outpatient Medications:    estradiol (ESTRACE) 0.5 MG tablet, Take 0.5 mg by mouth daily., Disp: , Rfl:    tirzepatide (ZEPBOUND) 2.5 MG/0.5ML Pen, Inject 2.5 mg into the skin once a week., Disp: 2 mL, Rfl: 0   TYLENOL DISSOLVE PACKS 500 MG PACK, Take 500-1,000 mg by mouth 2 (two) times daily as needed (for  headaches)., Disp: , Rfl:    hyoscyamine (LEVSIN SL) 0.125 MG SL tablet, Place 1 tablet (0.125 mg total) under the tongue every 6 (six) hours as needed. Use one tab 20-30 min before meals and at bedtime. (Patient not taking: Reported on 12/02/2022), Disp: 120 tablet, Rfl: 1  Allergies  Allergen Reactions   Other Other (See Comments)    CAN ONLY TAKE LIQUID MEDICATION- NO PILLS   Penicillins Hives and Other (See Comments)    Rash   Tape Rash   ROS neg/noncontributory except as noted HPI/below      Objective:     BP 122/70   Pulse 83   Temp  98.1 F (36.7 C) (Temporal)   Resp 18   Ht 5\' 6"  (1.676 m)   Wt 209 lb 6 oz (95 kg)   LMP 04/05/2016 (Exact Date)   SpO2 96%   BMI 33.79 kg/m  Wt Readings from Last 3 Encounters:  12/02/22 209 lb 6 oz (95 kg)  11/30/22 209 lb (94.8 kg)  07/13/22 215 lb (97.5 kg)    Physical Exam   Gen: WDWN NAD HEENT: NCAT, conjunctiva not injected, sclera nonicteric NECK:  supple, no thyromegaly, no nodes, no carotid bruits CARDIAC: RRR, S1S2+, no murmur. DP 2+B LUNGS: CTAB. No wheezes ABDOMEN:  BS+, soft, No HSM, no masses. +Slight tenderness, upper abdomen.  EXT:  no edema MSK: no gross abnormalities.  NEURO: A&O x3.  CN II-XII intact.  PSYCH: normal mood. Good eye contact     Assessment & Plan:  Diarrhea, unspecified type -     CBC with Differential/Platelet -     COMPLETE METABOLIC PANEL WITH eGFR -     TSH -     Magnesium -     Sedimentation rate  Prediabetes -     Hemoglobin A1c -     TSH  Myalgia -     CBC with Differential/Platelet -     COMPLETE METABOLIC PANEL WITH eGFR -     Sedimentation rate  Bilateral leg pain -     Vitamin B12 -     Iron, TIBC and Ferritin Panel -     Magnesium -     Sedimentation rate  Class 1 obesity due to excess calories with serious comorbidity and body mass index (BMI) of 33.0 to 33.9 in adult -     Zepbound; Inject 2.5 mg into the skin once a week.  Dispense: 2 mL; Refill: 0  Other fatigue -     CBC with Differential/Platelet -     COMPLETE METABOLIC PANEL WITH eGFR -     TSH -     Vitamin B12 -     Iron, TIBC and Ferritin Panel  1.  Diarrhea-started after hiatal hernia repair.  Patient is seeing GI.  She has not started Hyoscyamine yet.  Defer care/treatment to them other than checking basic labs. 2.  Prediabetes-chronic.  Long discussion on diet/exercise.  She is interested in starting Zepbound.  We will start at 2.5 mg and titrate upward.  Side effects were discussed.  Need to be cautious due to her GI issues. 3.   Myalgias/bilateral leg pain-chronic but getting a lot worse.  She has a lot of GI issues.  May have an iron deficiency.  Will check labs.  Also, may be menopause.  Just started on ERT.  Advised to do stretches/exercise. 4.  Fatigue-probably multifactorial.  Patient denies depression/anxiety.  Will check labs 5.  Obesity-patient has worked on diet/exercise  in the past without success.  She is interested in starting Zepbound.  Will do titration.  Side effects were discussed.  Advised caution given all of her GI issues.  Return in about 3 months (around 03/04/2023) for wt.    I, Isabelle Course, acting as a scribe for Angelena Sole, MD., have documented all relevant documentation on the behalf of Angelena Sole, MD, as directed by  Angelena Sole, MD while in the presence of Angelena Sole, MD.  I, Angelena Sole, MD, have reviewed all documentation for this visit. The documentation on 12/04/22 for the exam, diagnosis, procedures, and orders are all accurate and complete.     Angelena Sole, MD

## 2022-12-02 ENCOUNTER — Encounter: Payer: Self-pay | Admitting: Family Medicine

## 2022-12-02 ENCOUNTER — Ambulatory Visit: Payer: 59 | Admitting: Family Medicine

## 2022-12-02 VITALS — BP 122/70 | HR 83 | Temp 98.1°F | Resp 18 | Ht 66.0 in | Wt 209.4 lb

## 2022-12-02 DIAGNOSIS — M79605 Pain in left leg: Secondary | ICD-10-CM

## 2022-12-02 DIAGNOSIS — M791 Myalgia, unspecified site: Secondary | ICD-10-CM

## 2022-12-02 DIAGNOSIS — R7303 Prediabetes: Secondary | ICD-10-CM | POA: Diagnosis not present

## 2022-12-02 DIAGNOSIS — R197 Diarrhea, unspecified: Secondary | ICD-10-CM | POA: Diagnosis not present

## 2022-12-02 DIAGNOSIS — E66811 Obesity, class 1: Secondary | ICD-10-CM

## 2022-12-02 DIAGNOSIS — M79604 Pain in right leg: Secondary | ICD-10-CM | POA: Diagnosis not present

## 2022-12-02 DIAGNOSIS — Z6833 Body mass index (BMI) 33.0-33.9, adult: Secondary | ICD-10-CM

## 2022-12-02 DIAGNOSIS — R5383 Other fatigue: Secondary | ICD-10-CM

## 2022-12-02 DIAGNOSIS — E6609 Other obesity due to excess calories: Secondary | ICD-10-CM

## 2022-12-02 MED ORDER — ZEPBOUND 2.5 MG/0.5ML ~~LOC~~ SOAJ
2.5000 mg | SUBCUTANEOUS | 0 refills | Status: DC
Start: 2022-12-02 — End: 2022-12-13

## 2022-12-02 NOTE — Patient Instructions (Signed)

## 2022-12-03 LAB — CBC WITH DIFFERENTIAL/PLATELET
Absolute Monocytes: 511 {cells}/uL (ref 200–950)
Basophils Absolute: 49 {cells}/uL (ref 0–200)
Basophils Relative: 0.7 %
Eosinophils Absolute: 182 {cells}/uL (ref 15–500)
Eosinophils Relative: 2.6 %
HCT: 44.2 % (ref 35.0–45.0)
Hemoglobin: 14.5 g/dL (ref 11.7–15.5)
Lymphs Abs: 1743 {cells}/uL (ref 850–3900)
MCH: 28.5 pg (ref 27.0–33.0)
MCHC: 32.8 g/dL (ref 32.0–36.0)
MCV: 87 fL (ref 80.0–100.0)
MPV: 10.3 fL (ref 7.5–12.5)
Monocytes Relative: 7.3 %
Neutro Abs: 4515 {cells}/uL (ref 1500–7800)
Neutrophils Relative %: 64.5 %
Platelets: 316 10*3/uL (ref 140–400)
RBC: 5.08 10*6/uL (ref 3.80–5.10)
RDW: 11.3 % (ref 11.0–15.0)
Total Lymphocyte: 24.9 %
WBC: 7 10*3/uL (ref 3.8–10.8)

## 2022-12-03 LAB — COMPLETE METABOLIC PANEL WITH GFR
AG Ratio: 1.6 (calc) (ref 1.0–2.5)
ALT: 19 U/L (ref 6–29)
AST: 18 U/L (ref 10–30)
Albumin: 4 g/dL (ref 3.6–5.1)
Alkaline phosphatase (APISO): 108 U/L (ref 31–125)
BUN: 13 mg/dL (ref 7–25)
CO2: 25 mmol/L (ref 20–32)
Calcium: 9.4 mg/dL (ref 8.6–10.2)
Chloride: 109 mmol/L (ref 98–110)
Creat: 0.67 mg/dL (ref 0.50–0.99)
Globulin: 2.5 g/dL (ref 1.9–3.7)
Glucose, Bld: 103 mg/dL — ABNORMAL HIGH (ref 65–99)
Potassium: 3.9 mmol/L (ref 3.5–5.3)
Sodium: 143 mmol/L (ref 135–146)
Total Bilirubin: 0.6 mg/dL (ref 0.2–1.2)
Total Protein: 6.5 g/dL (ref 6.1–8.1)
eGFR: 110 mL/min/{1.73_m2} (ref >=60)

## 2022-12-03 LAB — MAGNESIUM: Magnesium: 2.1 mg/dL (ref 1.5–2.5)

## 2022-12-03 LAB — IRON,TIBC AND FERRITIN PANEL
%SAT: 32 % (ref 16–45)
Ferritin: 58 ng/mL (ref 16–232)
Iron: 79 ug/dL (ref 40–190)
TIBC: 249 ug/dL — ABNORMAL LOW (ref 250–450)

## 2022-12-03 LAB — HEMOGLOBIN A1C
Hgb A1c MFr Bld: 5.7 %{Hb} — ABNORMAL HIGH (ref <5.7)
Mean Plasma Glucose: 117 mg/dL
eAG (mmol/L): 6.5 mmol/L

## 2022-12-03 LAB — VITAMIN B12: Vitamin B-12: 328 pg/mL (ref 200–1100)

## 2022-12-03 LAB — SEDIMENTATION RATE: Sed Rate: 6 mm/h (ref 0–20)

## 2022-12-03 LAB — TSH: TSH: 0.01 m[IU]/L — ABNORMAL LOW

## 2022-12-04 NOTE — Progress Notes (Signed)
Labs look good except: 1.A1C(3 month average of sugars) is elevated.  This is considered PreDiabetes.  Work on diet-decrease sugars and starches and aim for 30 minutes of exercise 5 days/week to prevent progression to diabetes  2.  Thyroid may be acting hyper.  Make sure not taking any B complex vitamins or biotin.  Repeat TSH, free T3, free T4 in 1 week 3.  For her fatigue etc., advised to take a multivitamin, however do not start until after we recheck the thyroid levels

## 2022-12-05 ENCOUNTER — Other Ambulatory Visit: Payer: Self-pay | Admitting: *Deleted

## 2022-12-05 DIAGNOSIS — E0590 Thyrotoxicosis, unspecified without thyrotoxic crisis or storm: Secondary | ICD-10-CM

## 2022-12-07 ENCOUNTER — Other Ambulatory Visit (HOSPITAL_COMMUNITY): Payer: Self-pay

## 2022-12-07 ENCOUNTER — Telehealth: Payer: Self-pay | Admitting: Pharmacist

## 2022-12-07 ENCOUNTER — Encounter: Payer: Self-pay | Admitting: *Deleted

## 2022-12-07 NOTE — Telephone Encounter (Signed)
Pharmacy Patient Advocate Encounter   Received notification from CoverMyMeds that prior authorization for Zepbound 2.5MG /0.5ML pen-injectors is required/requested.   Insurance verification completed.   The patient is insured through [facility] .   Per test claim: PA required; PA submitted to [facility] via CoverMyMeds Key/confirmation #/EOC [id number] Status is pending

## 2022-12-07 NOTE — Telephone Encounter (Signed)
Pharmacy Patient Advocate Encounter  Received notification from [facility] that Prior Authorization for Zepbound 2.5MG /0.5ML pen-injectors has been APPROVED from 06/07/2022 to 06/07/2023. Ran test claim, Copay is $0.00. This test claim was processed through [facility]- copay amounts may vary at other pharmacies due to pharmacy/plan contracts, or as the patient moves through the different stages of their insurance plan.   PA #/Case ID/Reference #: [id number]

## 2022-12-07 NOTE — Telephone Encounter (Signed)
Patient notified of message below.

## 2022-12-13 ENCOUNTER — Other Ambulatory Visit (HOSPITAL_COMMUNITY): Payer: Self-pay

## 2022-12-13 ENCOUNTER — Other Ambulatory Visit: Payer: Self-pay | Admitting: *Deleted

## 2022-12-13 DIAGNOSIS — E66811 Obesity, class 1: Secondary | ICD-10-CM

## 2022-12-13 MED ORDER — ZEPBOUND 2.5 MG/0.5ML ~~LOC~~ SOAJ
2.5000 mg | SUBCUTANEOUS | 0 refills | Status: DC
Start: 2022-12-13 — End: 2023-02-13
  Filled 2022-12-13: qty 2, 28d supply, fill #0

## 2022-12-15 ENCOUNTER — Other Ambulatory Visit (INDEPENDENT_AMBULATORY_CARE_PROVIDER_SITE_OTHER): Payer: 59

## 2022-12-15 DIAGNOSIS — E0590 Thyrotoxicosis, unspecified without thyrotoxic crisis or storm: Secondary | ICD-10-CM

## 2022-12-16 ENCOUNTER — Ambulatory Visit
Admission: RE | Admit: 2022-12-16 | Discharge: 2022-12-16 | Disposition: A | Discharge status: Home / Self Care | Payer: 59 | Source: Ambulatory Visit | Attending: Obstetrics and Gynecology | Admitting: Obstetrics and Gynecology

## 2022-12-16 DIAGNOSIS — R928 Other abnormal and inconclusive findings on diagnostic imaging of breast: Secondary | ICD-10-CM

## 2022-12-16 LAB — T3, FREE: T3, Free: 3.1 pg/mL (ref 2.3–4.2)

## 2022-12-16 LAB — T4, FREE: Free T4: 0.62 ng/dL (ref 0.60–1.60)

## 2022-12-16 LAB — TSH: TSH: 1.27 u[IU]/mL (ref 0.35–5.50)

## 2022-12-16 NOTE — Progress Notes (Signed)
Thyroid is fine

## 2023-01-03 ENCOUNTER — Encounter: Payer: 59 | Admitting: Gastroenterology

## 2023-01-03 ENCOUNTER — Encounter: Payer: Self-pay | Admitting: Gastroenterology

## 2023-01-09 ENCOUNTER — Other Ambulatory Visit (HOSPITAL_COMMUNITY): Payer: Self-pay

## 2023-01-09 ENCOUNTER — Other Ambulatory Visit: Payer: Self-pay | Admitting: *Deleted

## 2023-01-09 DIAGNOSIS — E66811 Obesity, class 1: Secondary | ICD-10-CM

## 2023-01-09 MED ORDER — ZEPBOUND 5 MG/0.5ML ~~LOC~~ SOAJ
5.0000 mg | SUBCUTANEOUS | 0 refills | Status: DC
  Filled 2023-01-09: qty 2, 28d supply, fill #0

## 2023-01-26 ENCOUNTER — Encounter: Payer: Self-pay | Admitting: Gastroenterology

## 2023-01-26 ENCOUNTER — Ambulatory Visit: Payer: 59

## 2023-01-26 VITALS — Ht 67.0 in | Wt 204.0 lb

## 2023-01-26 DIAGNOSIS — Z1211 Encounter for screening for malignant neoplasm of colon: Secondary | ICD-10-CM

## 2023-01-26 MED ORDER — NA SULFATE-K SULFATE-MG SULF 17.5-3.13-1.6 GM/177ML PO SOLN: 1.0000 | Freq: Once | ORAL | 0 refills | Status: AC

## 2023-01-26 NOTE — Progress Notes (Signed)

## 2023-02-03 ENCOUNTER — Encounter: Payer: Self-pay | Admitting: Gastroenterology

## 2023-02-03 ENCOUNTER — Ambulatory Visit (AMBULATORY_SURGERY_CENTER): Payer: 59 | Admitting: Gastroenterology

## 2023-02-03 VITALS — BP 86/42 | HR 57 | Temp 97.9°F | Resp 13 | Ht 67.0 in | Wt 204.0 lb

## 2023-02-03 DIAGNOSIS — K529 Noninfective gastroenteritis and colitis, unspecified: Secondary | ICD-10-CM | POA: Diagnosis not present

## 2023-02-03 DIAGNOSIS — K648 Other hemorrhoids: Secondary | ICD-10-CM | POA: Diagnosis not present

## 2023-02-03 DIAGNOSIS — K644 Residual hemorrhoidal skin tags: Secondary | ICD-10-CM | POA: Diagnosis present

## 2023-02-03 MED ORDER — SODIUM CHLORIDE 0.9 % IV SOLN: 500.0000 mL | INTRAVENOUS | Status: DC

## 2023-02-03 NOTE — Progress Notes (Signed)
Called to room to assist during endoscopic procedure.  Patient ID and intended procedure confirmed with present staff. Received instructions for my participation in the procedure from the performing physician.  

## 2023-02-03 NOTE — Progress Notes (Signed)
Pt's states no medical or surgical changes since previsit or office visit. 

## 2023-02-03 NOTE — Progress Notes (Signed)
Gastroenterology History and Physical   Primary Care Physician:  Jeani Sow, MD   Reason for Procedure:  Chronic diarrhea  Plan:    colonoscopy with possible interventions as needed     HPI: Angela Hartman is a very pleasant 42 y.o. female here for colonoscopy for evaluation of chronic diarrhea.   The risks and benefits as well as alternatives of endoscopic procedure(s) have been discussed and reviewed. All questions answered. The patient agrees to proceed.    Past Medical History:  Diagnosis Date   Abnormal Pap smear of cervix    has had several   Anemia    Anxiety    Chest pain 2018   Went to ER, EKG normal, due to anxiety per patient   Chronic constipation 06/06/2022   Gastroesophageal reflux disease 02/23/2022   Headache    migraines   History of hiatal hernia    Ulcer     Past Surgical History:  Procedure Laterality Date   ABDOMINAL HYSTERECTOMY     CHOLECYSTECTOMY     CYSTOCELE REPAIR N/A 05/06/2016   Procedure: Repair Vaginal Cuff Dehisance,;  Surgeon: Carrington Clamp, MD;  Location: [facility];  Service: Gynecology;  Laterality: N/A;   CYSTOSCOPY N/A 04/20/2016   Procedure: CYSTOSCOPY;  Surgeon: Carrington Clamp, MD;  Location: [facility];  Service: Gynecology;  Laterality: N/A;   CYSTOSCOPY  05/06/2016   Procedure: CYSTOSCOPY;  Surgeon: Carrington Clamp, MD;  Location: [facility];  Service: Gynecology;;   ESOPHAGEAL MANOMETRY N/A 05/04/2022   Procedure: ESOPHAGEAL MANOMETRY (EM);  Surgeon: Napoleon Form, MD;  [facility];  Service: Gastroenterology;  Laterality: N/A;   ROBOTIC ASSISTED TOTAL HYSTERECTOMY WITH BILATERAL SALPINGO OOPHERECTOMY N/A 04/20/2016   Procedure: ROBOTIC ASSISTED TOTAL HYSTERECTOMY WITH BILATERAL SALPINGO OOPHORECTOMY;  Surgeon: Carrington Clamp, MD;  Location: [facility];  Service: Gynecology;  Laterality: N/A;   UPPER GASTROINTESTINAL ENDOSCOPY     WISDOM TOOTH EXTRACTION     XI ROBOTIC ASSISTED  PARAESOPHAGEAL HERNIA REPAIR N/A 07/13/2022   Procedure: ROBOTIC PARAESOPHAGEAL HIATAL HERNIA REPAIR WITH TOUPE FUNDOPLICATION;  Surgeon: Karie Soda, MD;  Location: [facility];  Service: General;  Laterality: N/A;  GEN w/ ERAS LOCAL    Prior to Admission medications   Medication Sig Start Date End Date Taking? Authorizing Provider  TYLENOL DISSOLVE PACKS 500 MG PACK Take 500-1,000 mg by mouth 2 (two) times daily as needed (for headaches).   Yes [provider]  estradiol (ESTRACE) 0.5 MG tablet Take 0.5 mg by mouth daily. Patient not taking: Reported on 01/26/2023 11/24/22   [provider]  hyoscyamine (LEVSIN SL) 0.125 MG SL tablet Place 1 tablet (0.125 mg total) under the tongue every 6 (six) hours as needed. Use one tab 20-30 min before meals and at bedtime. Patient not taking: Reported on 12/02/2022 11/30/22   Unk Lightning, PA  tirzepatide [facility]) 2.5 MG/0.5ML Pen Inject 2.5 mg into the skin once a week. Patient not taking: Reported on 01/26/2023 12/13/22   Jeani Sow, MD  tirzepatide [facility]) 5 MG/0.5ML Pen Inject 5 mg into the skin once a week. 01/09/23   Jeani Sow, MD    Current Outpatient Medications  Medication Sig Dispense Refill   TYLENOL DISSOLVE PACKS 500 MG PACK Take 500-1,000 mg by mouth 2 (two) times daily as needed (for headaches).     estradiol (ESTRACE) 0.5 MG tablet Take 0.5 mg by mouth daily. (Patient not taking: Reported on 01/26/2023)     hyoscyamine (LEVSIN SL) 0.125  MG SL tablet Place 1 tablet (0.125 mg total) under the tongue every 6 (six) hours as needed. Use one tab 20-30 min before meals and at bedtime. (Patient not taking: Reported on 12/02/2022) 120 tablet 1   tirzepatide (ZEPBOUND) 2.5 MG/0.5ML Pen Inject 2.5 mg into the skin once a week. (Patient not taking: Reported on 01/26/2023) 2 mL 0   tirzepatide (ZEPBOUND) 5 MG/0.5ML Pen Inject 5 mg into the skin once a week. 2 mL 0   Current Facility-Administered Medications   Medication Dose Route Frequency Provider Last Rate Last Admin   0.9 %  sodium chloride infusion  500 mL Intravenous Continuous Glen Blatchley, Eleonore Chiquito, MD        Allergies as of 02/03/2023 - Review Complete 02/03/2023  Allergen Reaction Noted   Other Other (See Comments) 04/12/2016   Penicillins Hives and Other (See Comments) 06/19/2012   Tape Rash 01/19/2022    Family History  Problem Relation Age of Onset   Hypertension Mother    Heart disease Father 11       CAD   Heart attack Father    Diabetes Paternal Grandmother    Esophageal cancer Neg Hx    Colon cancer Neg Hx    Rectal cancer Neg Hx    Stomach cancer Neg Hx    Colon polyps Neg Hx     Social History   Socioeconomic History   Marital status: Married    Spouse name: Not on file   Number of children: 2   Years of education: Not on file   Highest education level: Not on file  Occupational History   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   Social Drivers of Corporate investment banker Strain: Not on file  Food Insecurity: Not on file  Transportation Needs: Not on file  Physical Activity: Not on file  Stress: Not on file  Social Connections: Not on file  Intimate Partner Violence: Not on file    Review of Systems:  All other review of systems negative except as mentioned in the HPI.  Physical Exam: Vital signs in last 24 hours: BP 112/74   Pulse 77   Temp 97.9 F (36.6 C) (Temporal)   Ht 5\' 7"  (1.702 m)   Wt 204 lb (92.5 kg)   LMP 04/05/2016 (Exact Date)   SpO2 98%   BMI 31.95 kg/m  General:    Alert, NAD Lungs:  Clear .   Heart:  Regular rate and rhythm Abdomen:  Soft, nontender and nondistended. Neuro/Psych:  Alert and cooperative. Normal mood and affect. A and O x 3  Reviewed labs, radiology imaging, old records and pertinent past GI work up  Patient is appropriate for planned procedure(s) and anesthesia in an ambulatory setting   K. Scherry Ran , MD [phone]

## 2023-02-03 NOTE — Op Note (Signed)
[facility] Patient Name: Angela Hartman Procedure Date: 02/03/2023 8:27 AM MRN: [id number] Endoscopist: Napoleon Form , MD, [id number] Age: 42 Referring MD:  Date of Birth: 01-Apr-1980 Gender: Female Account #: [id number] Procedure:                Colonoscopy Indications:              Clinically significant diarrhea of unexplained                            origin Medicines:                Monitored Anesthesia Care Procedure:                Pre-Anesthesia Assessment:                           - Prior to the procedure, a History and Physical                            was performed, and patient medications and                            allergies were reviewed. The patient's tolerance of                            previous anesthesia was also reviewed. The risks                            and benefits of the procedure and the sedation                            options and risks were discussed with the patient.                            All questions were answered, and informed consent                            was obtained. Prior Anticoagulants: The patient has                            taken no anticoagulant or antiplatelet agents. ASA                            Grade Assessment: II - A patient with mild systemic                            disease. After reviewing the risks and benefits,                            the patient was deemed in satisfactory condition to                            undergo the procedure.  After obtaining informed consent, the colonoscope                            was passed under direct vision. Throughout the                            procedure, the patient's blood pressure, pulse, and                            oxygen saturations were monitored continuously. The                            Olympus Scope [id number] was introduced through the                            anus and advanced to the the cecum,  identified by                            appendiceal orifice and ileocecal valve. The                            colonoscopy was performed without difficulty. The                            patient tolerated the procedure well. The quality                            of the bowel preparation was inadequate and not                            adequate to identify polyps greater than 5 mm in                            size. The terminal ileum, ileocecal valve,                            appendiceal orifice, and rectum were photographed. Scope In: 8:39:41 AM Scope Out: 8:59:22 AM Scope Withdrawal Time: 0 hours 6 minutes 51 seconds  Total Procedure Duration: 0 hours 19 minutes 41 seconds  Findings:                 The perianal and digital rectal examinations were                            normal.                           Normal mucosa was found in the entire colon.                            Biopsies for histology were taken with a cold                            forceps from the right colon and left colon for  evaluation of microscopic colitis.                           Non-bleeding external and internal hemorrhoids were                            found during retroflexion. The hemorrhoids were                            medium-sized. Complications:            No immediate complications. Estimated Blood Loss:     Estimated blood loss was minimal. Impression:               - Preparation of the colon was inadequate.                           - Preparation of the colon was inadequate.                           - Normal mucosa in the entire examined colon.                            Biopsied.                           - Non-bleeding external and internal hemorrhoids. Recommendation:           - Patient has a contact number available for                            emergencies. The signs and symptoms of potential                            delayed complications were  discussed with the                            patient. Return to normal activities tomorrow.                            Written discharge instructions were provided to the                            patient.                           - Resume previous diet.                           - Continue present medications.                           - Await pathology results.                           - Repeat colonoscopy in 1 year for screening  purposes as this was inadequate due to poor prep.                           - Use Benefiber two teaspoons PO BID. Napoleon Form, MD 02/03/2023 9:06:58 AM This report has been signed electronically.

## 2023-02-03 NOTE — Patient Instructions (Addendum)
Recommendation:           - Patient has a contact number available for                            emergencies. The signs and symptoms of potential                            delayed complications were discussed with the                            patient. Return to normal activities tomorrow.                            Written discharge instructions were provided to the                            patient.                           - Resume previous diet.                           - Continue present medications.                           - Await pathology results.                           - Repeat colonoscopy in 1 year for screening                            purposes as this was inadequate due to poor prep.                           - Use Benefiber two teaspoons PO BID.   YOU HAD AN ENDOSCOPIC PROCEDURE TODAY AT THE [facility]:   Refer to the procedure report that was given to you for any specific questions about what was found during the examination.  If the procedure report does not answer your questions, please call your gastroenterologist to clarify.  If you requested that your care partner not be given the details of your procedure findings, then the procedure report has been included in a sealed envelope for you to review at your convenience later.  YOU SHOULD EXPECT: Some feelings of bloating in the abdomen. Passage of more gas than usual.  Walking can help get rid of the air that was put into your GI tract during the procedure and reduce the bloating. If you had a lower endoscopy (such as a colonoscopy or flexible sigmoidoscopy) you may notice spotting of blood in your stool or on the toilet paper. If you underwent a bowel prep for your procedure, you may not have a normal bowel movement for a few days.  Please Note:  You might notice some irritation and congestion in your nose or some drainage.  This is from the oxygen used during your procedure.  There is no need for  concern and it should clear up in a day or so.  SYMPTOMS TO REPORT IMMEDIATELY:  Following lower endoscopy (colonoscopy or flexible sigmoidoscopy):  Excessive amounts of blood in the stool  Significant tenderness or worsening of abdominal pains  Swelling of the abdomen that is new, acute  Fever of 100F or higher  For urgent or emergent issues, a gastroenterologist can be reached at any hour by calling [phone]. Do not use MyChart messaging for urgent concerns.    DIET:  We do recommend a small meal at first, but then you may proceed to your regular diet.  Drink plenty of fluids but you should avoid alcoholic beverages for 24 hours.  ACTIVITY:  You should plan to take it easy for the rest of today and you should NOT DRIVE or use heavy machinery until tomorrow (because of the sedation medicines used during the test).    FOLLOW UP: Our staff will call the number listed on your records the next business day following your procedure.  We will call around 7:15- 8:00 am to check on you and address any questions or concerns that you may have regarding the information given to you following your procedure. If we do not reach you, we will leave a message.     If any biopsies were taken you will be contacted by phone or by letter within the next 1-3 weeks.  Please call us at [phone] if you have not heard about the biopsies in 3 weeks.    SIGNATURES/CONFIDENTIALITY: You and/or your care partner have signed paperwork which will be entered into your electronic medical record.  These signatures attest to the fact that that the information above on your After Visit Summary has been reviewed and is understood.  Full responsibility of the confidentiality of this discharge information lies with you and/or your care-partner.

## 2023-02-03 NOTE — Progress Notes (Signed)
Report to PACU, RN, vss, BBS= Clear.  

## 2023-02-06 ENCOUNTER — Telehealth: Payer: Self-pay

## 2023-02-06 NOTE — Telephone Encounter (Signed)
Left message on answering machine. 

## 2023-02-07 LAB — SURGICAL PATHOLOGY

## 2023-02-11 ENCOUNTER — Encounter: Payer: Self-pay | Admitting: Family Medicine

## 2023-02-13 ENCOUNTER — Other Ambulatory Visit: Payer: Self-pay | Admitting: Family Medicine

## 2023-02-16 ENCOUNTER — Other Ambulatory Visit (HOSPITAL_COMMUNITY): Payer: Self-pay

## 2023-02-16 ENCOUNTER — Other Ambulatory Visit: Payer: Self-pay | Admitting: *Deleted

## 2023-02-16 MED ORDER — ZEPBOUND 7.5 MG/0.5ML ~~LOC~~ SOAJ
7.5000 mg | SUBCUTANEOUS | 0 refills | Status: DC
  Filled 2023-02-16: qty 2, 28d supply, fill #0

## 2023-02-17 ENCOUNTER — Encounter: Payer: 59 | Admitting: Gastroenterology

## 2023-03-03 ENCOUNTER — Encounter: Payer: Self-pay | Admitting: Gastroenterology

## 2023-03-07 ENCOUNTER — Other Ambulatory Visit (HOSPITAL_COMMUNITY): Payer: Self-pay

## 2023-03-07 ENCOUNTER — Encounter: Payer: Self-pay | Admitting: Family Medicine

## 2023-03-07 ENCOUNTER — Ambulatory Visit: Payer: 59 | Admitting: Family Medicine

## 2023-03-07 VITALS — BP 100/68 | HR 73 | Temp 97.8°F | Resp 18 | Ht 67.0 in | Wt 192.5 lb

## 2023-03-07 DIAGNOSIS — E66811 Obesity, class 1: Secondary | ICD-10-CM

## 2023-03-07 DIAGNOSIS — Z6830 Body mass index (BMI) 30.0-30.9, adult: Secondary | ICD-10-CM

## 2023-03-07 DIAGNOSIS — E6609 Other obesity due to excess calories: Secondary | ICD-10-CM | POA: Diagnosis not present

## 2023-03-07 DIAGNOSIS — R7303 Prediabetes: Secondary | ICD-10-CM | POA: Diagnosis not present

## 2023-03-07 DIAGNOSIS — R197 Diarrhea, unspecified: Secondary | ICD-10-CM | POA: Diagnosis not present

## 2023-03-07 MED ORDER — ZEPBOUND 7.5 MG/0.5ML ~~LOC~~ SOAJ
7.5000 mg | SUBCUTANEOUS | 0 refills | Status: DC
  Filled 2023-03-07: qty 6, 84d supply, fill #0
  Filled 2023-03-08 – 2023-03-14 (×2): qty 2, 28d supply, fill #0

## 2023-03-07 NOTE — Progress Notes (Signed)
 Subjective:     Patient ID: Angela Hartman, female    DOB: [birth year], 43 y.o.   MRN: [id number]  Chief Complaint  Patient presents with   Medical Management of Chronic Issues    3 month follow-up     HPI Discussed the use of AI scribe software for clinical note transcription with the patient, who gave verbal consent to proceed.  preDM-working on diet/exercise.  Taking zepbound  7.5mg  weekly  History of Present Illness   The patient, currently on Zepbound  7.5 mg weekly for weight loss, reports a significant weight reduction of 17 pounds over the past few months. They have tolerated the medication well, with no reported side effects such as nausea or vomiting. However, they note a shift from previous diarrhea to constipation since starting the medication. taking fiber wafers  Previously, the patient had been evaluated for diarrhea by Gastroenterology, with a colonoscopy revealing no significant findings. The diarrhea resolved after starting Zepbound . The patient also reports a history of fatigue, which they attribute to their night shift work schedule and the need to adjust their sleep schedule on their days off.  The patient has a history of hot flashes but has not been consistently taking prescribed hormone therapy. They report that the hot flashes have improved and they no longer feel the need for the medication. They also have a history of stomach cramps and spasms, for which they were prescribed hyoscyamine . However, they have not taken this medication as their stomach issues have resolved.  In addition to Zepbound , the patient takes Tylenol  as needed and a daily regimen of vitamin C and hair and nail vitamins. The patient has made significant lifestyle changes, including quitting smoking and initiating a weight loss regimen. They were previously exercising regularly at a gym, but recent changes at their workplace have disrupted this routine. They are looking for alternative  ways to maintain their physical activity.       There are no preventive care reminders to display for this patient.   Past Medical History:  Diagnosis Date   Abnormal Pap smear of cervix    has had several   Anemia    Anxiety    Chest pain 2018   Went to ER, EKG normal, due to anxiety per patient   Chronic constipation 06/06/2022   Gastroesophageal reflux disease 02/23/2022   Headache    migraines   History of hiatal hernia    Ulcer     Past Surgical History:  Procedure Laterality Date   ABDOMINAL HYSTERECTOMY     CHOLECYSTECTOMY     CYSTOCELE REPAIR N/A 05/06/2016   Procedure: Repair Vaginal Cuff Dehisance,;  Surgeon: Rosaline Luna, MD;  Location: [facility];  Service: Gynecology;  Laterality: N/A;   CYSTOSCOPY N/A 04/20/2016   Procedure: CYSTOSCOPY;  Surgeon: Rosaline Luna, MD;  Location: [facility];  Service: Gynecology;  Laterality: N/A;   CYSTOSCOPY  05/06/2016   Procedure: CYSTOSCOPY;  Surgeon: Rosaline Luna, MD;  Location: [facility];  Service: Gynecology;;   ESOPHAGEAL MANOMETRY N/A 05/04/2022   Procedure: ESOPHAGEAL MANOMETRY (EM);  Surgeon: Shila Gustav GAILS, MD;  Location: [facility];  Service: Gastroenterology;  Laterality: N/A;   ROBOTIC ASSISTED TOTAL HYSTERECTOMY WITH BILATERAL SALPINGO OOPHERECTOMY N/A 04/20/2016   Procedure: ROBOTIC ASSISTED TOTAL HYSTERECTOMY WITH BILATERAL SALPINGO OOPHORECTOMY;  Surgeon: Rosaline Luna, MD;  Location: [facility];  Service: Gynecology;  Laterality: N/A;   UPPER GASTROINTESTINAL ENDOSCOPY     WISDOM TOOTH EXTRACTION     XI ROBOTIC ASSISTED PARAESOPHAGEAL  HERNIA REPAIR N/A 07/13/2022   Procedure: ROBOTIC PARAESOPHAGEAL HIATAL HERNIA REPAIR WITH TOUPE FUNDOPLICATION;  Surgeon: Sheldon Standing, MD;  Location: [facility];  Service: General;  Laterality: N/A;  GEN w/ [facility] LOCAL     Current Outpatient Medications:    TYLENOL  DISSOLVE PACKS 500 MG PACK, Take 500-1,000 mg by mouth 2 (two) times daily as needed (for headaches)., Disp: ,  Rfl:    hyoscyamine  (LEVSIN  SL) 0.125 MG SL tablet, Place 1 tablet (0.125 mg total) under the tongue every 6 (six) hours as needed. Use one tab 20-30 min before meals and at bedtime. (Patient not taking: Reported on 12/02/2022), Disp: 120 tablet, Rfl: 1   tirzepatide  (ZEPBOUND ) 7.5 MG/0.5ML Pen, Inject 7.5 mg into the skin once a week., Disp: 6 mL, Rfl: 0  Allergies  Allergen Reactions   Other Other (See Comments)    CAN ONLY TAKE LIQUID MEDICATION- NO PILLS   Penicillins Hives and Other (See Comments)    Rash   Tape Rash   ROS neg/noncontributory except as noted HPI/below      Objective:     BP 100/68   Pulse 73   Temp 97.8 F (36.6 C) (Temporal)   Resp 18   Ht 5' 7 (1.702 m)   Wt 192 lb 8 oz (87.3 kg)   LMP 04/05/2016 (Exact Date)   SpO2 98%   BMI 30.15 kg/m  Wt Readings from Last 3 Encounters:  03/07/23 192 lb 8 oz (87.3 kg)  02/03/23 204 lb (92.5 kg)  01/26/23 204 lb (92.5 kg)    Physical Exam   Gen: WDWN NAD HEENT: NCAT, conjunctiva not injected, sclera nonicteric NECK:  supple, no thyromegaly, no nodes, no carotid bruits CARDIAC: RRR, S1S2+, no murmur.  LUNGS: CTAB. No wheezes ABDOMEN:  BS+, soft, NTND, No HSM, no masses EXT:  no edema MSK: no gross abnormalities.  NEURO: A&O x3.  CN II-XII intact.  PSYCH: normal mood. Good eye contact  Reviewed GI notes/labs     Assessment & Plan:  Diarrhea, unspecified type  Prediabetes  Class 1 obesity due to excess calories without serious comorbidity with body mass index (BMI) of 30.0 to 30.9 in adult  Other orders -     Zepbound ; Inject 7.5 mg into the skin once a week.  Dispense: 6 mL; Refill: 0  Assessment and Plan PreDM-chronic.  Controlled.  Continue diet/exercise/zepbound  7.5mg  weekly    Obesity Weight loss of 17 pounds since October on Zepbound  7.5 mg weekly is noted. Mild constipation is managed with fiber supplements, and there is no nausea or vomiting. Exercise is emphasized for muscle mass and  overall health, with a goal weight range of 160-140 pounds. Continue Zepbound  7.5 mg weekly, encourage adequate hydration, and consider daily Miralax  if constipation persists. Encourage regular exercise and send a three-month prescription for Zepbound  to the pharmacy.  Chronic Diarrhea Chronic diarrhea has resolved since starting Zepbound , with a previous normal colonoscopy. Current mild constipation is present. Monitor bowel habits and continue fiber supplements as needed.  Fatigue Persistent fatigue is likely due to night shift work and irregular sleep patterns. Monitor fatigue and encourage a consistent sleep schedule.  Hot Flashes Improvement in hot flashes is noted without hormone therapy, with no current need for medication. Monitor symptoms.  General Health Maintenance Tetanus vaccination was declined. The importance of B12 supplementation was discussed due to low-normal levels. Continue the current vitamin regimen and add a B12 supplement. Check if current hair and nail vitamins contain B12 and add  a B12 or B complex dissolvable vitamin to the regimen. Encourage continued use of vitamin C chewables.  Follow-up Schedule a follow-up appointment in a few months and contact the office if any problems arise.        Return in about 3 months (around 06/05/2023) for wt.  Jenkins CHRISTELLA Carrel, MD

## 2023-03-07 NOTE — Patient Instructions (Addendum)
 It was very nice to see you today!  Awesome job!   Check if B12 in the hair and nails vitamins--get B12 or B complex-dissolvable vitamin    PLEASE NOTE:  If you had any lab tests please let us  know if you have not heard back within a few days. You may see your results on MyChart before we have a chance to review them but we will give you a call once they are reviewed by us . If we ordered any referrals today, please let us  know if you have not heard from their office within the next week.   Please try these tips to maintain a healthy lifestyle:  Eat most of your calories during the day when you are active. Eliminate processed foods including packaged sweets (pies, cakes, cookies), reduce intake of potatoes, white bread, white pasta, and white rice. Look for whole grain options, oat flour or almond flour.  Each meal should contain half fruits/vegetables, one quarter protein, and one quarter carbs (no bigger than a computer mouse).  Cut down on sweet beverages. This includes juice, soda, and sweet tea. Also watch fruit intake, though this is a healthier sweet option, it still contains natural sugar! Limit to 3 servings daily.  Drink at least 1 glass of water  with each meal and aim for at least 8 glasses per day  Exercise at least 150 minutes every week.

## 2023-03-08 ENCOUNTER — Other Ambulatory Visit (HOSPITAL_COMMUNITY): Payer: Self-pay

## 2023-03-11 ENCOUNTER — Encounter: Payer: Self-pay | Admitting: Family Medicine

## 2023-03-14 ENCOUNTER — Other Ambulatory Visit (HOSPITAL_COMMUNITY): Payer: Self-pay

## 2023-04-03 ENCOUNTER — Encounter: Payer: Self-pay | Admitting: Family Medicine

## 2023-04-03 ENCOUNTER — Other Ambulatory Visit: Payer: Self-pay | Admitting: Family Medicine

## 2023-04-03 MED ORDER — ZEPBOUND 10 MG/0.5ML ~~LOC~~ SOAJ: 10.0000 mg | SUBCUTANEOUS | 1 refills | Status: DC

## 2023-04-14 ENCOUNTER — Encounter: Payer: Self-pay | Admitting: Family Medicine

## 2023-04-14 ENCOUNTER — Other Ambulatory Visit (HOSPITAL_COMMUNITY): Payer: Self-pay

## 2023-04-17 ENCOUNTER — Other Ambulatory Visit (HOSPITAL_COMMUNITY): Payer: Self-pay

## 2023-04-17 MED ORDER — ZEPBOUND 10 MG/0.5ML ~~LOC~~ SOAJ
10.0000 mg | SUBCUTANEOUS | 1 refills | Status: DC
  Filled 2023-04-17 – 2023-05-05 (×2): qty 2, 28d supply, fill #0

## 2023-04-17 NOTE — Addendum Note (Signed)
 Addended by: Jobe Gibbon on: 04/17/2023 08:38 AM   Modules accepted: Orders

## 2023-05-05 ENCOUNTER — Other Ambulatory Visit (HOSPITAL_COMMUNITY): Payer: Self-pay

## 2023-05-09 ENCOUNTER — Telehealth: Payer: Self-pay

## 2023-05-09 ENCOUNTER — Other Ambulatory Visit (HOSPITAL_COMMUNITY): Payer: Self-pay

## 2023-05-09 NOTE — Telephone Encounter (Signed)
 Pharmacy Patient Advocate Encounter   Received notification from CoverMyMeds that prior authorization for Zepbound 10MG /0.5ML pen-injectors is required/requested.   Insurance verification completed.   The patient is insured through [facility] .   Per test claim: PA required; PA submitted to above mentioned insurance via CoverMyMeds Key/confirmation #/EOC (Key: [id number])    Status is pending

## 2023-05-09 NOTE — Telephone Encounter (Signed)
 Pharmacy Patient Advocate Encounter  Received notification from [facility] that Prior Authorization for Zepbound 10MG /0.5ML pen-injectors  has been APPROVED from 05/09/23 to 05/08/24. Ran test claim, Copay is $24.99. This test claim was processed through [facility]- copay amounts may vary at other pharmacies due to pharmacy/plan contracts, or as the patient moves through the different stages of their insurance plan.   PA #/Case ID/Reference #: [id number]

## 2023-06-02 ENCOUNTER — Other Ambulatory Visit (HOSPITAL_COMMUNITY): Payer: Self-pay

## 2023-06-02 ENCOUNTER — Encounter: Payer: Self-pay | Admitting: Family Medicine

## 2023-06-02 ENCOUNTER — Ambulatory Visit: Payer: 59 | Admitting: Family Medicine

## 2023-06-02 VITALS — BP 98/70 | HR 81 | Temp 98.3°F | Resp 18 | Ht 67.0 in | Wt 167.5 lb

## 2023-06-02 DIAGNOSIS — E66811 Obesity, class 1: Secondary | ICD-10-CM | POA: Diagnosis not present

## 2023-06-02 DIAGNOSIS — E6609 Other obesity due to excess calories: Secondary | ICD-10-CM

## 2023-06-02 DIAGNOSIS — R7303 Prediabetes: Secondary | ICD-10-CM

## 2023-06-02 DIAGNOSIS — Z6833 Body mass index (BMI) 33.0-33.9, adult: Secondary | ICD-10-CM

## 2023-06-02 MED ORDER — ZEPBOUND 10 MG/0.5ML ~~LOC~~ SOAJ
10.0000 mg | SUBCUTANEOUS | 2 refills | Status: DC
  Filled 2023-06-02: qty 2, 28d supply, fill #0
  Filled 2023-06-30 – 2023-07-03 (×2): qty 2, 28d supply, fill #1
  Filled 2023-08-01: qty 2, 28d supply, fill #2

## 2023-06-02 NOTE — Patient Instructions (Signed)

## 2023-06-02 NOTE — Progress Notes (Signed)
 Subjective:     Patient ID: Angela Hartman, female    DOB: [birth year], 43 y.o.   MRN: [id number]  Chief Complaint  Patient presents with   Follow-up    3 month follow-up on weight     HPI Zepbound Discussed the use of AI scribe software for clinical note transcription with the patient, who gave verbal consent to proceed.  History of Present Illness Angela Hartman is a 43 year old female who presents with follow-up on weight management with Zepbound.  She is taking Zepbound at a dose of 10 mg and has lost 42 pounds since starting the medication around October, with her weight decreasing from 209 pounds to 167 pounds. She is actively working on her diet and exercise regimen, although there was a temporary disruption in her exercise routine due to a change in work location. She plans to resume her gym activities next week.  PreDM-on zepbound and working on diet/exercise  She experiences intermittent diarrhea and constipation, which she describes as tolerable. She maintains hydration by drinking a lot of water and occasionally takes fiber supplements. She also takes vitamins, including vitamin C and B12, although not consistently every day.  No chest pain, shortness of breath, or thoughts of suicide.  She quit smoking about a year ago and reports feeling much better since then.  Her husband has also started on Zepbound at a dose of 7 mg and has experienced significant weight loss, which she notes with satisfaction.    There are no preventive care reminders to display for this patient.  Past Medical History:  Diagnosis Date   Abnormal Pap smear of cervix    has had several   Anemia    Anxiety    Chest pain 2018   Went to ER, EKG normal, due to anxiety per patient   Chronic constipation 06/06/2022   Gastroesophageal reflux disease 02/23/2022   Headache    migraines   History of hiatal hernia    Ulcer     Past Surgical History:  Procedure Laterality  Date   ABDOMINAL HYSTERECTOMY     CHOLECYSTECTOMY     CYSTOCELE REPAIR N/A 05/06/2016   Procedure: Repair Vaginal Cuff Dehisance,;  Surgeon: Matt Song, MD;  Location: [facility];  Service: Gynecology;  Laterality: N/A;   CYSTOSCOPY N/A 04/20/2016   Procedure: CYSTOSCOPY;  Surgeon: Matt Song, MD;  Location: [facility];  Service: Gynecology;  Laterality: N/A;   CYSTOSCOPY  05/06/2016   Procedure: CYSTOSCOPY;  Surgeon: Matt Song, MD;  Location: [facility];  Service: Gynecology;;   ESOPHAGEAL MANOMETRY N/A 05/04/2022   Procedure: ESOPHAGEAL MANOMETRY (EM);  Surgeon: Sergio Dandy, MD;  Location: [facility];  Service: Gastroenterology;  Laterality: N/A;   ROBOTIC ASSISTED TOTAL HYSTERECTOMY WITH BILATERAL SALPINGO OOPHERECTOMY N/A 04/20/2016   Procedure: ROBOTIC ASSISTED TOTAL HYSTERECTOMY WITH BILATERAL SALPINGO OOPHORECTOMY;  Surgeon: Matt Song, MD;  Location: [facility];  Service: Gynecology;  Laterality: N/A;   UPPER GASTROINTESTINAL ENDOSCOPY     WISDOM TOOTH EXTRACTION     XI ROBOTIC ASSISTED PARAESOPHAGEAL HERNIA REPAIR N/A 07/13/2022   Procedure: ROBOTIC PARAESOPHAGEAL HIATAL HERNIA REPAIR WITH TOUPE FUNDOPLICATION;  Surgeon: Candyce Champagne, MD;  Location: [facility];  Service: General;  Laterality: N/A;  GEN w/ [facility] LOCAL     Current Outpatient Medications:    TYLENOL DISSOLVE PACKS 500 MG PACK, Take 500-1,000 mg by mouth 2 (two) times daily as needed (for headaches)., Disp: , Rfl:    tirzepatide (ZEPBOUND) 10 MG/0.5ML Pen,  Inject 10 mg into the skin once a week., Disp: 2 mL, Rfl: 2  Allergies  Allergen Reactions   Other Other (See Comments)    CAN ONLY TAKE LIQUID MEDICATION- NO PILLS   Penicillins Hives and Other (See Comments)    Rash   Tape Rash   ROS neg/noncontributory except as noted HPI/below      Objective:     BP 98/70   Pulse 81   Temp 98.3 F (36.8 C) (Temporal)   Resp 18   Ht 5\' 7"  (1.702 m)   Wt 167 lb 8 oz (76 kg)   LMP 04/05/2016 (Exact  Date)   SpO2 95%   BMI 26.23 kg/m  Wt Readings from Last 3 Encounters:  06/02/23 167 lb 8 oz (76 kg)  03/07/23 192 lb 8 oz (87.3 kg)  02/03/23 204 lb (92.5 kg)    Physical Exam   Gen: WDWN NAD HEENT: NCAT, conjunctiva not injected, sclera nonicteric NECK:  supple, no thyromegaly, no nodes, no carotid bruits CARDIAC: RRR, S1S2+, no murmur.  LUNGS: CTAB. No wheezes ABDOMEN:  BS+, soft, NTND, No HSM, no masses EXT:  no edema MSK: no gross abnormalities.  NEURO: A&O x3.  CN II-XII intact.  PSYCH: normal mood. Good eye contact     Assessment & Plan:  Prediabetes  Class 1 obesity due to excess calories with serious comorbidity and body mass index (BMI) of 33.0 to 33.9 in adult  Other orders -     Zepbound; Inject 10 mg into the skin once a week.  Dispense: 2 mL; Refill: 2  Assessment and Plan Assessment & Plan Obesity   She has been on Zepbound 10 mg since October, resulting in a weight loss of 42 pounds, from 209 lbs to 167 lbs. She tolerates the medication well, experiencing only intermittent diarrhea and constipation. Her exercise routine has been disrupted due to a change in work location. She is nearing her goal weight, and the current dose is effective. Increasing the dose is not recommended unless a plateau occurs. Continue Zepbound 10 mg, encourage diet and exercise, and ensure medication is filled at the preferred pharmacy with copay card benefits. Provide a couple of refills   PreDM-doing well on zepbound w/wt loss.  Will check labs in 3 mo  Nutritional Deficiency   Reduced food intake raises concerns about nutritional deficiency. She takes vitamins C, B12, and others sporadically. Encourage regular vitamin intake to address potential deficiencies.  Smoking Cessation   She feels much better since quitting smoking about a year ago. Continue to support smoking cessation efforts.  General Health Maintenance   She has not had recent blood work, including cholesterol and  A1c tests. Schedule a physical examination in three months and order blood work, including cholesterol and A1c tests, during the examination.    Return in about 3 months (around 09/01/2023) for annual physical.  Ellsworth Haas, MD

## 2023-07-03 ENCOUNTER — Other Ambulatory Visit (HOSPITAL_COMMUNITY): Payer: Self-pay

## 2023-08-29 ENCOUNTER — Other Ambulatory Visit (HOSPITAL_COMMUNITY): Payer: Self-pay

## 2023-09-06 ENCOUNTER — Other Ambulatory Visit (HOSPITAL_COMMUNITY): Payer: Self-pay

## 2023-09-06 ENCOUNTER — Ambulatory Visit (INDEPENDENT_AMBULATORY_CARE_PROVIDER_SITE_OTHER): Admitting: Family Medicine

## 2023-09-06 ENCOUNTER — Encounter: Payer: Self-pay | Admitting: Family Medicine

## 2023-09-06 VITALS — BP 95/63 | HR 77 | Temp 98.0°F | Resp 18 | Ht 67.0 in | Wt 155.5 lb

## 2023-09-06 DIAGNOSIS — Z0000 Encounter for general adult medical examination without abnormal findings: Secondary | ICD-10-CM | POA: Diagnosis not present

## 2023-09-06 MED ORDER — ZEPBOUND 10 MG/0.5ML ~~LOC~~ SOAJ
10.0000 mg | SUBCUTANEOUS | 2 refills | Status: DC
  Filled 2023-09-06: qty 2, 28d supply, fill #0
  Filled 2023-09-27: qty 2, 28d supply, fill #1
  Filled 2023-10-28: qty 2, 28d supply, fill #2

## 2023-09-06 NOTE — Patient Instructions (Signed)

## 2023-09-06 NOTE — Progress Notes (Signed)
 Phone [phone]   Subjective:   Patient is a 43 y.o. female presenting for annual physical.    Chief Complaint  Patient presents with   Annual Exam    CPE Fasting    Annual-working on diet/exercise Wt-started at 209 12/02/22  today 155-total 54# Discussed the use of AI scribe software for clinical note transcription with the patient, who gave verbal consent to proceed.  History of Present Illness Angela Hartman is a 43 year old female who presents for follow-up regarding hormone therapy and weight management.  She experiences nocturnal hot flashes, prompting her to resume Estrace, which she is currently taking as prescribed.  She is on Zepbound  weekly for weight management. She recently ran out of her medication but was supposed to have a refill yesterday. She reports feeling good overall, except for the night sweats. She mentioned she might want to lose about ten more pounds, but is aware of the need to avoid excessive weight loss. No side effects  She experiences occasional dizziness, particularly when getting up in the morning after lying down. Bp's are low  No issues with constipation, diarrhea, heartburn, major headaches, chest pains, heart racing, or shortness of breath. No depressive symptoms, suicidal thoughts, or unusual vaginal bleeding.  She is planning a cruise next week and discusses the timing of her Zepbound  injections around her travel schedule. She does not drink alcohol regularly but might have a drink on the cruise.    See problem oriented charting- ROS- ROS: Gen: no fever, chills  Skin: no rash, itching ENT: no ear pain, ear drainage, nasal congestion, rhinorrhea, sinus pressure, sore throat Eyes: no blurry vision, double vision Resp: no cough, wheeze,SOB CV: no CP, palpitations, LE edema,  GI: no heartburn, n/v/d/c, abd pain GU: no dysuria, urgency, frequency, hematuria MSK: no joint pain, myalgias, back pain Neuro: no, headache, weakness,  vertigo Psych: no depression, anxiety, insomnia, SI   The following were reviewed and entered/updated in epic: Past Medical History:  Diagnosis Date   Abnormal Pap smear of cervix    has had several   Anemia    Anxiety    Chest pain 2018   Went to ER, EKG normal, due to anxiety per patient   Chronic constipation 06/06/2022   Gastroesophageal reflux disease 02/23/2022   Headache    migraines   History of hiatal hernia    Ulcer    Patient Active Problem List   Diagnosis Date Noted   Prediabetes 03/07/2023   Peptic duodenitis 07/13/2022   Incarcerated hiatal hernia 07/13/2022   Pill dysphagia 07/13/2022   Cervical intraepithelial neoplasia grade 1 06/06/2022   Chronic constipation 06/06/2022   Nausea and vomiting 02/23/2022   Hiatal hernia with GERD and esophagitis 02/23/2022   Leg swelling 08/17/2021   Bilateral leg pain 08/17/2021   Weight gain 08/17/2021   Heel pain 08/17/2021   Easy bruising 08/28/2019   Elevated TSH 08/28/2019   Hyperglycemia 08/28/2019   Postoperative state 04/20/2016   Chest pain 12/09/2013   Smoking 12/09/2013   Past Surgical History:  Procedure Laterality Date   ABDOMINAL HYSTERECTOMY     CHOLECYSTECTOMY     CYSTOCELE REPAIR N/A 05/06/2016   Procedure: Repair Vaginal Cuff Dehisance,;  Surgeon: Rosaline Luna, MD;  Location: [facility];  Service: Gynecology;  Laterality: N/A;   CYSTOSCOPY N/A 04/20/2016   Procedure: CYSTOSCOPY;  Surgeon: Rosaline Luna, MD;  Location: [facility];  Service: Gynecology;  Laterality: N/A;   CYSTOSCOPY  05/06/2016   Procedure: CYSTOSCOPY;  Surgeon: Rosaline Luna, MD;  Location: [facility];  Service: Gynecology;;   ESOPHAGEAL MANOMETRY N/A 05/04/2022   Procedure: ESOPHAGEAL MANOMETRY (EM);  Surgeon: Shila Gustav GAILS, MD;  Location: [facility];  Service: Gastroenterology;  Laterality: N/A;   ROBOTIC ASSISTED TOTAL HYSTERECTOMY WITH BILATERAL SALPINGO OOPHERECTOMY N/A 04/20/2016   Procedure: ROBOTIC ASSISTED TOTAL  HYSTERECTOMY WITH BILATERAL SALPINGO OOPHORECTOMY;  Surgeon: Rosaline Luna, MD;  Location: [facility];  Service: Gynecology;  Laterality: N/A;   UPPER GASTROINTESTINAL ENDOSCOPY     WISDOM TOOTH EXTRACTION     XI ROBOTIC ASSISTED PARAESOPHAGEAL HERNIA REPAIR N/A 07/13/2022   Procedure: ROBOTIC PARAESOPHAGEAL HIATAL HERNIA REPAIR WITH TOUPE FUNDOPLICATION;  Surgeon: Sheldon Standing, MD;  Location: [facility];  Service: General;  Laterality: N/A;  GEN w/ ERAS LOCAL    Family History  Problem Relation Age of Onset   Hypertension Mother    Heart disease Father 32       CAD   Heart attack Father    Diabetes Paternal Grandmother    Esophageal cancer Neg Hx    Colon cancer Neg Hx    Rectal cancer Neg Hx    Stomach cancer Neg Hx    Colon polyps Neg Hx     Medications- reviewed and updated Current Outpatient Medications  Medication Sig Dispense Refill   estradiol (ESTRACE) 0.5 MG tablet Take 0.5 mg by mouth daily.     TYLENOL  DISSOLVE PACKS 500 MG PACK Take 500-1,000 mg by mouth 2 (two) times daily as needed (for headaches).     tirzepatide  (ZEPBOUND ) 10 MG/0.5ML Pen Inject 10 mg into the skin once a week. 2 mL 2   No current facility-administered medications for this visit.    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   Objective  Objective:  BP 95/63   Pulse 77   Temp 98 F (36.7 C) (Temporal)   Resp 18   Ht 5' 7 (1.702 m)   Wt 155 lb 8 oz (70.5 kg)   LMP 04/05/2016 (Exact Date)   SpO2 98%   BMI 24.35 kg/m  Physical Exam  Gen: WDWN NAD HEENT: NCAT, conjunctiva not injected, sclera nonicteric TM WNL B, OP moist, no exudates  NECK:  supple, no thyromegaly, no nodes, no carotid bruits CARDIAC: RRR, S1S2+, no murmur. DP 2+B LUNGS: CTAB. No  wheezes ABDOMEN:  BS+, soft, NTND, No HSM, no masses EXT:  no edema MSK: no gross abnormalities. MS 5/5 all 4 NEURO: A&O x3.  CN II-XII intact.  PSYCH: normal mood. Good eye contact     Assessment and Plan   Health Maintenance counseling: 1. Anticipatory guidance: Patient counseled regarding regular dental exams q6 months, eye exams,  avoiding smoking and second hand smoke, limiting alcohol to 1 beverage per day, no illicit drugs.   2. Risk factor reduction:  Advised patient of need for regular exercise and diet rich and fruits and vegetables to reduce risk of heart attack and stroke. Exercise- +.  Wt Readings from Last 3 Encounters:  09/06/23 155 lb 8 oz (70.5 kg)  06/02/23 167 lb 8 oz (76 kg)  03/07/23 192 lb 8 oz (87.3 kg)   3. Immunizations/screenings/ancillary studies  There is no immunization history on file for this  patient. Health Maintenance Due  Topic Date Due   Hepatitis B Vaccines (1 of 3 - 19+ 3-dose series) Never done   HPV VACCINES (1 - Risk 3-dose SCDM series) Never done    4. Cervical cancer screening- hyst 5. Breast cancer screening-  mammogram done oct 6. Colon cancer screening -  7. Skin cancer screening- advised regular sunscreen use. Denies worrisome, changing, or new skin lesions.  8. Birth control/STD check- hyst 9. Osteoporosis screening- n/a 10. Smoking associated screening - non smoker  Wellness examination -     CBC with Differential/Platelet -     Comprehensive metabolic panel with GFR -     Lipid panel -     TSH -     Hemoglobin A1c  Other orders -     Zepbound ; Inject 10 mg into the skin once a week.  Dispense: 2 mL; Refill: 2   Wellness-anticipatory guidance.  Work on Diet/Exercise  Check CBC,CMP,lipids,TSH, A1C.  F/u 1 yr  declines Tdap Assessment and Plan Assessment & Plan Menopausal symptoms   She experiences recurrent nocturnal hot flashes after stopping Estrace. Resumed Estrace for effective symptom management. Continue Estrace for  menopausal symptoms.  Orthostatic hypotension   Dizziness occurs upon rising in the morning, likely due to orthostatic hypotension and low blood pressure. Advise rising slowly in the morning to prevent dizziness and potential falls. Compression stockings during day  Weight management   Currently on Zepbound  with effective weight loss, having reached her ideal weight. Desires to lose an additional ten pounds but advised against excessive weight loss to prevent underweight status. Emphasized maintaining a healthy weight. Continue Zepbound  weekly for three more months and reassess weight and medication regimen at that time.  General Health Maintenance   Due for a tetanus booster, as the last one was likely 18 years ago. Discussed the importance of a tetanus shot, especially in case of injuries. Administer tetanus booster if she consents. Perform routine blood work.    Recommended follow up: Return in about 3 months (around 12/07/2023) for wt.  Lab/Order associations:+ fasting  Jenkins CHRISTELLA Carrel, MD

## 2023-09-07 ENCOUNTER — Ambulatory Visit: Payer: Self-pay | Admitting: Family Medicine

## 2023-09-07 LAB — COMPREHENSIVE METABOLIC PANEL WITH GFR
ALT: 13 U/L (ref 0–35)
AST: 18 U/L (ref 0–37)
Albumin: 4.3 g/dL (ref 3.5–5.2)
Alkaline Phosphatase: 67 U/L (ref 39–117)
BUN: 15 mg/dL (ref 6–23)
CO2: 29 meq/L (ref 19–32)
Calcium: 9.3 mg/dL (ref 8.4–10.5)
Chloride: 106 meq/L (ref 96–112)
Creatinine, Ser: 0.76 mg/dL (ref 0.40–1.20)
GFR: 94.92 mL/min (ref >60.00)
Glucose, Bld: 85 mg/dL (ref 70–99)
Potassium: 3.7 meq/L (ref 3.5–5.1)
Sodium: 143 meq/L (ref 135–145)
Total Bilirubin: 0.8 mg/dL (ref 0.2–1.2)
Total Protein: 6.5 g/dL (ref 6.0–8.3)

## 2023-09-07 LAB — LIPID PANEL
Cholesterol: 135 mg/dL (ref 0–200)
HDL: 58.9 mg/dL (ref >39.00)
LDL Cholesterol: 66 mg/dL (ref 0–99)
NonHDL: 76.48
Total CHOL/HDL Ratio: 2
Triglycerides: 54 mg/dL (ref 0.0–149.0)
VLDL: 10.8 mg/dL (ref 0.0–40.0)

## 2023-09-07 LAB — CBC WITH DIFFERENTIAL/PLATELET
Basophils Absolute: 0 K/uL (ref 0.0–0.1)
Basophils Relative: 0.4 % (ref 0.0–3.0)
Eosinophils Absolute: 0.1 K/uL (ref 0.0–0.7)
Eosinophils Relative: 2 % (ref 0.0–5.0)
HCT: 40.5 % (ref 36.0–46.0)
Hemoglobin: 13.3 g/dL (ref 12.0–15.0)
Lymphocytes Relative: 32.8 % (ref 12.0–46.0)
Lymphs Abs: 1.9 K/uL (ref 0.7–4.0)
MCHC: 33 g/dL (ref 30.0–36.0)
MCV: 88 fl (ref 78.0–100.0)
Monocytes Absolute: 0.4 K/uL (ref 0.1–1.0)
Monocytes Relative: 6.7 % (ref 3.0–12.0)
Neutro Abs: 3.4 K/uL (ref 1.4–7.7)
Neutrophils Relative %: 58.1 % (ref 43.0–77.0)
Platelets: 248 K/uL (ref 150.0–400.0)
RBC: 4.6 Mil/uL (ref 3.87–5.11)
RDW: 13.5 % (ref 11.5–15.5)
WBC: 5.9 K/uL (ref 4.0–10.5)

## 2023-09-07 LAB — HEMOGLOBIN A1C: Hgb A1c MFr Bld: 5.6 % (ref 4.6–6.5)

## 2023-09-07 LAB — TSH: TSH: 2.36 u[IU]/mL (ref 0.35–5.50)

## 2023-09-07 NOTE — Progress Notes (Signed)
Labs great

## 2023-10-30 ENCOUNTER — Ambulatory Visit: Payer: Self-pay

## 2023-10-30 NOTE — Telephone Encounter (Signed)
 FYI Only or Action Required?: FYI only for provider.  Patient was last seen in primary care on 09/06/2023 by Wendolyn Jenkins Jansky, MD.  Called Nurse Triage reporting Anxiety.  Symptoms began x couple of days.  Interventions attempted: Nothing.  Symptoms are: gradually worsening.  Triage Disposition: See Physician Within 24 Hours  Patient/caregiver understands and will follow disposition?: Yes  Copied from CRM #[id number]. Topic: Clinical - Red Word Triage >> Oct 30, 2023  4:08 PM [place] E wrote: [facility] that prompted transfer to Nurse Triage: Worsening anxiety over the past couple days. Reason for Disposition  Patient sounds very upset or troubled to the triager  Answer Assessment - Initial Assessment Questions 1. CONCERN: Did anything happen that prompted you to call today?      Increased anxiety - baby boy went to college &  2. ANXIETY SYMPTOMS: Can you describe how you (your loved one; patient) have been feeling? (e.g., tense, restless, panicky, anxious, keyed up, overwhelmed, sense of impending doom).      Overwhelmed, crys 3. ONSET: How long have you been feeling this way? (e.g., hours, days, weeks)     X couple of days 4. SEVERITY: How would you rate the level of anxiety? (e.g., 0 - 10; or mild, moderate, severe).     moderate 5. FUNCTIONAL IMPAIRMENT: How have these feelings affected your ability to do daily activities? Have you had more difficulty than usual doing your normal daily activities? (e.g., getting better, same, worse; self-care, school, work, interactions)     yes 6. HISTORY: Have you felt this way before? Have you ever been diagnosed with an anxiety problem in the past? (e.g., generalized anxiety disorder, panic attacks, PTSD). If Yes, ask: How was this problem treated? (e.g., medicines, counseling, etc.)     no 7. RISK OF HARM - SUICIDAL IDEATION: Do you ever have thoughts of hurting or killing yourself? If Yes, ask:  Do you have these feelings  now? Do you have a plan on how you would do this?     no 8. TREATMENT:  What has been done so far to treat this anxiety? (e.g., medicines, relaxation strategies). What has helped?     none 9. THERAPIST: Do you have a counselor or therapist? If Yes, ask: What is their name?     no 10. POTENTIAL TRIGGERS: Do you drink caffeinated beverages (e.g., coffee, colas, teas), and how much daily? Do you drink alcohol or use any drugs? Have you started any new medicines recently?       no 11. PATIENT SUPPORT: Who is with you now? Who do you live with? Do you have family or friends who you can talk to?        Yes - older son 49. OTHER SYMPTOMS: Do you have any other symptoms? (e.g., feeling depressed, trouble concentrating, trouble sleeping, trouble breathing, palpitations or fast heartbeat, chest pain, sweating, nausea, or diarrhea)       Trouble sleeping, chest pain - feels like someone is sitting on chest: pt states chest pain only when she is upset 13. PREGNANCY: Is there any chance you are pregnant? When was your last menstrual period?       Na   Pt stated quit smoking 1 1/2 years and started back last night due to anxiety. Today is also her brother's day he killed himself.  Protocols used: Anxiety and Panic Attack-A-AH

## 2023-10-30 NOTE — Telephone Encounter (Signed)
 See message below

## 2023-10-31 ENCOUNTER — Ambulatory Visit: Admitting: Family Medicine

## 2023-10-31 ENCOUNTER — Encounter: Payer: Self-pay | Admitting: Family Medicine

## 2023-10-31 VITALS — BP 99/69 | HR 100 | Temp 99.5°F | Resp 16 | Ht 67.0 in | Wt 146.2 lb

## 2023-10-31 DIAGNOSIS — F4323 Adjustment disorder with mixed anxiety and depressed mood: Secondary | ICD-10-CM | POA: Diagnosis not present

## 2023-10-31 MED ORDER — SERTRALINE HCL 50 MG PO TABS: 50.0000 mg | ORAL_TABLET | Freq: Every day | ORAL | 1 refills | Status: DC

## 2023-10-31 MED ORDER — HYDROXYZINE PAMOATE 25 MG PO CAPS: 25.0000 mg | ORAL_CAPSULE | Freq: Three times a day (TID) | ORAL | 1 refills | Status: DC | PRN

## 2023-10-31 NOTE — Patient Instructions (Signed)
 It was very nice to see you today!  Take 1/2 tab sertraline  daily for the rest of the week, then whole tab  Get to counseling  Be safe   PLEASE NOTE:  If you had any lab tests please let us  know if you have not heard back within a few days. You may see your results on MyChart before we have a chance to review them but we will give you a call once they are reviewed by us . If we ordered any referrals today, please let us  know if you have not heard from their office within the next week.   Please try these tips to maintain a healthy lifestyle:  Eat most of your calories during the day when you are active. Eliminate processed foods including packaged sweets (pies, cakes, cookies), reduce intake of potatoes, white bread, white pasta, and white rice. Look for whole grain options, oat flour or almond flour.  Each meal should contain half fruits/vegetables, one quarter protein, and one quarter carbs (no bigger than a computer mouse).  Cut down on sweet beverages. This includes juice, soda, and sweet tea. Also watch fruit intake, though this is a healthier sweet option, it still contains natural sugar! Limit to 3 servings daily.  Drink at least 1 glass of water  with each meal and aim for at least 8 glasses per day  Exercise at least 150 minutes every week.

## 2023-10-31 NOTE — Progress Notes (Signed)
 Subjective:     Patient ID: Angela Hartman, female    DOB: Apr 25, 1980, 43 y.o.   MRN: [id number]  Chief Complaint  Patient presents with   Anxiety    Increase anxiety      HPI Discussed the use of AI scribe software for clinical note transcription with the patient, who gave verbal consent to proceed.  History of Present Illness Angela Hartman is a 43 year old female who presents with stress-related symptoms and weight loss.  She is experiencing significant stress due to family issues, particularly involving her younger son and his girlfriend, which has caused tension within her family. She describes a sensation of pressure on her chest, akin to several cinder blocks, and extreme weakness in her left arm, making it difficult to carry items.  She has been unable to eat due to stress, stating that the thought of food induces nausea, leading to rapid weight loss. She has reduced her zepbound  intake, taking it every one and a half to two weeks instead of weekly, and has not opened a new prescription box yet.  She experiences dizziness, vision issues, and a dry mouth. She has resumed smoking after a year and a half of abstinence, attributing it to stress. She acknowledges her nutrition is poor, primarily consuming water .  No suicidal ideation, but she expresses a lack of care about many aspects of her life. She has a history of anxiety medication use following her husband's death but does not recall the specifics.  She mentions her husband, who is on medication for his temper, has never been physically abusive but has shown aggressive behavior in the past. He told her to pack her bags and leave d/t strain w/son's relationship.     Health Maintenance Due  Topic Date Due   Hepatitis B Vaccines 19-59 Average Risk (1 of 3 - 19+ 3-dose series) Never done   HPV VACCINES (1 - Risk 3-dose SCDM series) Never done   Influenza Vaccine  Never done    Past Medical History:   Diagnosis Date   Abnormal Pap smear of cervix    has had several   Anemia    Anxiety    Chest pain 2018   Went to ER, EKG normal, due to anxiety per patient   Chronic constipation 06/06/2022   Gastroesophageal reflux disease 02/23/2022   Headache    migraines   History of hiatal hernia    Ulcer     Past Surgical History:  Procedure Laterality Date   ABDOMINAL HYSTERECTOMY     CHOLECYSTECTOMY     CYSTOCELE REPAIR N/A 05/06/2016   Procedure: Repair Vaginal Cuff Dehisance,;  Surgeon: Rosaline Luna, MD;  Location: [facility];  Service: Gynecology;  Laterality: N/A;   CYSTOSCOPY N/A 04/20/2016   Procedure: CYSTOSCOPY;  Surgeon: Rosaline Luna, MD;  Location: [facility];  Service: Gynecology;  Laterality: N/A;   CYSTOSCOPY  05/06/2016   Procedure: CYSTOSCOPY;  Surgeon: Rosaline Luna, MD;  Location: [facility];  Service: Gynecology;;   ESOPHAGEAL MANOMETRY N/A 05/04/2022   Procedure: ESOPHAGEAL MANOMETRY (EM);  Surgeon: Shila Gustav GAILS, MD;  Location: [facility];  Service: Gastroenterology;  Laterality: N/A;   ROBOTIC ASSISTED TOTAL HYSTERECTOMY WITH BILATERAL SALPINGO OOPHERECTOMY N/A 04/20/2016   Procedure: ROBOTIC ASSISTED TOTAL HYSTERECTOMY WITH BILATERAL SALPINGO OOPHORECTOMY;  Surgeon: Rosaline Luna, MD;  Location: [facility];  Service: Gynecology;  Laterality: N/A;   UPPER GASTROINTESTINAL ENDOSCOPY     WISDOM TOOTH EXTRACTION     XI ROBOTIC ASSISTED  PARAESOPHAGEAL HERNIA REPAIR N/A 07/13/2022   Procedure: ROBOTIC PARAESOPHAGEAL HIATAL HERNIA REPAIR WITH TOUPE FUNDOPLICATION;  Surgeon: Sheldon Standing, MD;  Location: [facility];  Service: General;  Laterality: N/A;  GEN w/ ERAS LOCAL     Current Outpatient Medications:    hydrOXYzine  (VISTARIL ) 25 MG capsule, Take 1 capsule (25 mg total) by mouth every 8 (eight) hours as needed., Disp: 30 capsule, Rfl: 1   sertraline  (ZOLOFT ) 50 MG tablet, Take 1 tablet (50 mg total) by mouth daily., Disp: 30 tablet, Rfl: 1   tirzepatide  (ZEPBOUND )  10 MG/0.5ML Pen, Inject 10 mg into the skin once a week., Disp: 2 mL, Rfl: 2   TYLENOL  DISSOLVE PACKS 500 MG PACK, Take 500-1,000 mg by mouth 2 (two) times daily as needed (for headaches)., Disp: , Rfl:   Allergies  Allergen Reactions   Other Other (See Comments)    CAN ONLY TAKE LIQUID MEDICATION- NO PILLS   Penicillins Hives and Other (See Comments)    Rash   Tape Rash   ROS neg/noncontributory except as noted HPI/below      Objective:     BP 99/69   Pulse 100   Temp 99.5 F (37.5 C) (Temporal)   Resp 16   Ht 5' 7 (1.702 m)   Wt 146 lb 4 oz (66.3 kg)   LMP 04/05/2016 (Exact Date)   SpO2 99%   BMI 22.91 kg/m  Wt Readings from Last 3 Encounters:  10/31/23 146 lb 4 oz (66.3 kg)  09/06/23 155 lb 8 oz (70.5 kg)  06/02/23 167 lb 8 oz (76 kg)    Physical Exam   Gen: WDWN NAD HEENT: NCAT, conjunctiva not injected, sclera nonicteric.  eomi NECK:  supple, no thyromegaly, no nodes, no carotid bruits CARDIAC: tachy RRR, S1S2+, no murmur.  EXT:  no edema MSK: no gross abnormalities. Ms 5/5 bue NEURO: A&O x3.  CN II-XII intact.  PSYCH: distraught/crying/anxious mood. Good eye contact     Assessment & Plan:  Adjustment disorder with mixed anxiety and depressed mood -     Sertraline  HCl; Take 1 tablet (50 mg total) by mouth daily.  Dispense: 30 tablet; Refill: 1 -     hydrOXYzine  Pamoate; Take 1 capsule (25 mg total) by mouth every 8 (eight) hours as needed.  Dispense: 30 capsule; Refill: 1  Assessment and Plan Assessment & Plan Adjustment disorder with mixed anxiety and depressed mood   She is experiencing significant stress due to family issues, including conflict with her son and husband. She reports feeling crushed, with heaviness on her chest, vision issues, and extreme stress. There is no suicidal ideation, but she expresses a lack of care for her well-being. Previously, she used anxiety medication after her husband's death but not recently. We discussed potential  medication options for anxiety and depression, including sertraline  and hydroxyzine . She was advised on potential side effects of sertraline , such as dizziness, nausea, looser stools, decreased appetite, dry mouth, decreased sex drive, and delayed orgasm. Hydroxyzine  was discussed as a short-term option for acute anxiety relief. Start sertraline , half tab for the rest of the week, then increase to a full tab. Prescribe hydroxyzine  every eight hours as needed for acute anxiety. Encourage counseling through [facility] plan for additional support. Advise taking the rest of the week off work to adjust to medications and manage stress.  Unintentional weight loss and poor oral intake   She has significant weight loss due to stress and poor oral intake. She reports an inability to  eat, with the thought of food causing nausea, and is currently only drinking water , raising concerns about nutrition and potential hospitalization. We discussed the importance of maintaining nutrition to prevent further health issues. Encourage consumption of protein drinks to maintain nutrition. Monitor weight and nutritional status closely.  Nicotine  dependence, current smoker   She resumed smoking due to stress, despite having quit for a year and a half. It is acknowledged as a temporary crutch that could lead to long-term health issues. Discuss the risks of smoking and encourage cessation.  Left arm weakness   She reports extreme weakness in the left arm, making it difficult to carry items-exam negative.  May be malnutrition, stress, etc. .    Return in about 3 weeks (around 11/21/2023) for mood and wt.  cancel the oct 22 appt.  Jenkins CHRISTELLA Carrel, MD

## 2023-11-06 ENCOUNTER — Telehealth: Payer: Self-pay

## 2023-11-06 NOTE — Telephone Encounter (Signed)
 Updated Letter sent through MyChart.   Copied from CRM #[id number]. Topic: General - Other >> Nov 06, 2023 11:34 AM Revonda D wrote: Reason for CRM: Pt stated that [facility] submitted a letter stating that she is to be excused from work today.Pt stated that she would like for the date to be extended until Thursday due to having an appt on Wednesday. Pt would like a callback with an update.

## 2023-11-09 ENCOUNTER — Encounter: Payer: Self-pay | Admitting: Family Medicine

## 2023-11-22 ENCOUNTER — Other Ambulatory Visit: Payer: Self-pay | Admitting: Family Medicine

## 2023-11-22 DIAGNOSIS — F4323 Adjustment disorder with mixed anxiety and depressed mood: Secondary | ICD-10-CM

## 2023-11-24 ENCOUNTER — Encounter: Payer: Self-pay | Admitting: Family Medicine

## 2023-11-24 ENCOUNTER — Other Ambulatory Visit (HOSPITAL_COMMUNITY): Payer: Self-pay

## 2023-11-24 ENCOUNTER — Ambulatory Visit: Admitting: Family Medicine

## 2023-11-24 ENCOUNTER — Ambulatory Visit: Payer: Self-pay | Admitting: Family Medicine

## 2023-11-24 VITALS — BP 100/58 | HR 91 | Temp 98.4°F | Ht 67.0 in | Wt 143.0 lb

## 2023-11-24 DIAGNOSIS — R0789 Other chest pain: Secondary | ICD-10-CM

## 2023-11-24 DIAGNOSIS — M79605 Pain in left leg: Secondary | ICD-10-CM

## 2023-11-24 DIAGNOSIS — M79604 Pain in right leg: Secondary | ICD-10-CM

## 2023-11-24 DIAGNOSIS — E559 Vitamin D deficiency, unspecified: Secondary | ICD-10-CM | POA: Diagnosis not present

## 2023-11-24 DIAGNOSIS — F4323 Adjustment disorder with mixed anxiety and depressed mood: Secondary | ICD-10-CM

## 2023-11-24 DIAGNOSIS — R000 Tachycardia, unspecified: Secondary | ICD-10-CM | POA: Diagnosis not present

## 2023-11-24 DIAGNOSIS — R1010 Upper abdominal pain, unspecified: Secondary | ICD-10-CM | POA: Diagnosis not present

## 2023-11-24 LAB — COMPREHENSIVE METABOLIC PANEL WITH GFR
ALT: 14 U/L (ref 0–35)
AST: 18 U/L (ref 0–37)
Albumin: 4.3 g/dL (ref 3.5–5.2)
Alkaline Phosphatase: 62 U/L (ref 39–117)
BUN: 16 mg/dL (ref 6–23)
CO2: 28 meq/L (ref 19–32)
Calcium: 9.4 mg/dL (ref 8.4–10.5)
Chloride: 107 meq/L (ref 96–112)
Creatinine, Ser: 0.78 mg/dL (ref 0.40–1.20)
GFR: 91.87 mL/min (ref >60.00)
Glucose, Bld: 94 mg/dL (ref 70–99)
Potassium: 3.7 meq/L (ref 3.5–5.1)
Sodium: 143 meq/L (ref 135–145)
Total Bilirubin: 0.7 mg/dL (ref 0.2–1.2)
Total Protein: 6.6 g/dL (ref 6.0–8.3)

## 2023-11-24 LAB — TSH: TSH: 3.26 u[IU]/mL (ref 0.35–5.50)

## 2023-11-24 LAB — SEDIMENTATION RATE: Sed Rate: 3 mm/h (ref 0–20)

## 2023-11-24 LAB — CBC WITH DIFFERENTIAL/PLATELET
Basophils Absolute: 0.1 K/uL (ref 0.0–0.1)
Basophils Relative: 0.9 % (ref 0.0–3.0)
Eosinophils Absolute: 0.1 K/uL (ref 0.0–0.7)
Eosinophils Relative: 2.1 % (ref 0.0–5.0)
HCT: 42.5 % (ref 36.0–46.0)
Hemoglobin: 14.1 g/dL (ref 12.0–15.0)
Lymphocytes Relative: 28.2 % (ref 12.0–46.0)
Lymphs Abs: 1.5 K/uL (ref 0.7–4.0)
MCHC: 33.1 g/dL (ref 30.0–36.0)
MCV: 88.2 fl (ref 78.0–100.0)
Monocytes Absolute: 0.3 K/uL (ref 0.1–1.0)
Monocytes Relative: 6 % (ref 3.0–12.0)
Neutro Abs: 3.4 K/uL (ref 1.4–7.7)
Neutrophils Relative %: 62.8 % (ref 43.0–77.0)
Platelets: 238 K/uL (ref 150.0–400.0)
RBC: 4.81 Mil/uL (ref 3.87–5.11)
RDW: 13.2 % (ref 11.5–15.5)
WBC: 5.4 K/uL (ref 4.0–10.5)

## 2023-11-24 LAB — MAGNESIUM: Magnesium: 2.1 mg/dL (ref 1.5–2.5)

## 2023-11-24 LAB — VITAMIN D 25 HYDROXY (VIT D DEFICIENCY, FRACTURES): VITD: 20.97 ng/mL — ABNORMAL LOW (ref 30.00–100.00)

## 2023-11-24 LAB — LIPASE: Lipase: 29 U/L (ref 11.0–59.0)

## 2023-11-24 LAB — AMYLASE: Amylase: 38 U/L (ref 27–131)

## 2023-11-24 LAB — VITAMIN B12: Vitamin B-12: 386 pg/mL (ref 211–911)

## 2023-11-24 MED ORDER — ZEPBOUND 10 MG/0.5ML ~~LOC~~ SOAJ: 10.0000 mg | SUBCUTANEOUS | 0 refills | Status: DC

## 2023-11-24 MED ORDER — ZEPBOUND 10 MG/0.5ML ~~LOC~~ SOAJ
10.0000 mg | SUBCUTANEOUS | 0 refills | Status: DC
  Filled 2023-11-24 – 2023-12-09 (×2): qty 2, 28d supply, fill #0

## 2023-11-24 MED ORDER — SERTRALINE HCL 20 MG/ML PO CONC: 50.0000 mg | Freq: Every day | ORAL | 1 refills | Status: DC

## 2023-11-24 MED ORDER — HYDROXYZINE PAMOATE 25 MG PO CAPS: 25.0000 mg | ORAL_CAPSULE | Freq: Three times a day (TID) | ORAL | 0 refills | Status: DC | PRN

## 2023-11-24 NOTE — Progress Notes (Signed)
 Subjective:     Patient ID: Angela Hartman, female    DOB: 24-Dec-1980, 43 y.o.   MRN: [id number]  Chief Complaint  Patient presents with   Depression    Follow up   Leg Pain    Knots forming in my legs that are getting more painful; noticed them a while ago but now the pain is interfering with sleep    HPI Discussed the use of AI scribe software for clinical note transcription with the patient, who gave verbal consent to proceed.  History of Present Illness Angela Hartman is a 43 year old female who presents for follow-up on mood management.  She feels 'numb' to her situation and has not been taking sertraline  due to gastrointestinal side effects, specifically nausea. She has been using hydroxyzine  on an as-needed basis but has not required it recently. She denies any current suicidal ideation. She has been attending counseling sessions with her husband, which she feels have improved her relationship, although stress related to her son remains a concern.  A recent incident involving her son, Selinda, caused significant emotional distress. He received a speeding ticket while visiting his girlfriend, which has financial implications for her as he is unemployed. This situation has contributed to her stress levels.  She was informed that she lost three pounds since her last visit, though she believed she had gained weight. She attributes this to her current medication regimen, including Mounjaro , which she takes about every 10days She acknowledges a decrease in physical activity, using her lunch breaks to rest instead of exercising.  She experiences painful 'knots' in her legs, which sometimes keep her awake at night. Her husband can feel these lumps during massages, which provide temporary relief. The leg pain has been present for a few months and is worsening.  She reports intermittent chest pain that occurs randomly, sometimes with activity and sometimes at rest. The  pain can be severe, as described during an incident when carrying a heavy bag. No associated symptoms such as shortness of breath, dizziness, or sweating. The chest pain has been present for about a month. Only lasts about 1-2 minutes.  No radiation of pain  She has a history of smoking, which she resumed due to stress related to her son. She is attempting to quit again. Her father had a history of heart disease, passing away at the age of 44.    Health Maintenance Due  Topic Date Due   COVID-19 Vaccine (1) Never done   Mammogram  Never done    Past Medical History:  Diagnosis Date   Abnormal Pap smear of cervix    has had several   Anemia    Anxiety    Chest pain 2018   Went to ER, EKG normal, due to anxiety per patient   Chronic constipation 06/06/2022   Gastroesophageal reflux disease 02/23/2022   Headache    migraines   History of hiatal hernia    Ulcer     Past Surgical History:  Procedure Laterality Date   ABDOMINAL HYSTERECTOMY     CHOLECYSTECTOMY     CYSTOCELE REPAIR N/A 05/06/2016   Procedure: Repair Vaginal Cuff Dehisance,;  Surgeon: Rosaline Luna, MD;  Location: [facility];  Service: Gynecology;  Laterality: N/A;   CYSTOSCOPY N/A 04/20/2016   Procedure: CYSTOSCOPY;  Surgeon: Rosaline Luna, MD;  Location: [facility];  Service: Gynecology;  Laterality: N/A;   CYSTOSCOPY  05/06/2016   Procedure: CYSTOSCOPY;  Surgeon: Rosaline Luna, MD;  [facility];  Service: Gynecology;;   ESOPHAGEAL MANOMETRY N/A 05/04/2022   Procedure: ESOPHAGEAL MANOMETRY (EM);  Surgeon: Nandigam, Kavitha V, MD;  Location: [facility];  Service: Gastroenterology;  Laterality: N/A;   ROBOTIC ASSISTED TOTAL HYSTERECTOMY WITH BILATERAL SALPINGO OOPHERECTOMY N/A 04/20/2016   Procedure: ROBOTIC ASSISTED TOTAL HYSTERECTOMY WITH BILATERAL SALPINGO OOPHORECTOMY;  Surgeon: Rosaline Luna, MD;  Location: [facility];  Service: Gynecology;  Laterality: N/A;   UPPER GASTROINTESTINAL ENDOSCOPY      WISDOM TOOTH EXTRACTION     XI ROBOTIC ASSISTED PARAESOPHAGEAL HERNIA REPAIR N/A 07/13/2022   Procedure: ROBOTIC PARAESOPHAGEAL HIATAL HERNIA REPAIR WITH TOUPE FUNDOPLICATION;  Surgeon: Sheldon Standing, MD;  Location: [facility];  Service: General;  Laterality: N/A;  GEN w/ [facility]AS LOCAL     Current Outpatient Medications:    sertraline  (ZOLOFT ) 20 MG/ML concentrated solution, Take 2.5 mLs (50 mg total) by mouth daily., Disp: 60 mL, Rfl: 1   sertraline  (ZOLOFT ) 50 MG tablet, Take 1 tablet (50 mg total) by mouth daily., Disp: 30 tablet, Rfl: 1   TYLENOL  DISSOLVE PACKS 500 MG PACK, Take 500-1,000 mg by mouth 2 (two) times daily as needed (for headaches)., Disp: , Rfl:    hydrOXYzine  (VISTARIL ) 25 MG capsule, Take 1 capsule (25 mg total) by mouth every 8 (eight) hours as needed., Disp: 90 capsule, Rfl: 0   tirzepatide  (ZEPBOUND ) 10 MG/0.5ML Pen, Inject 10 mg into the skin once a week., Disp: 2 mL, Rfl: 0  Allergies  Allergen Reactions   Other Other (See Comments)    CAN ONLY TAKE LIQUID MEDICATION- NO PILLS   Penicillins Hives and Other (See Comments)    Rash   Tape Rash   ROS neg/noncontributory except as noted HPI/below      Objective:     BP (!) 100/58   Pulse 91   Temp 98.4 F (36.9 C)   Ht 5' 7 (1.702 m)   Wt 143 lb (64.9 kg)   LMP 04/05/2016 (Exact Date)   SpO2 98%   BMI 22.40 kg/m  Wt Readings from Last 3 Encounters:  11/24/23 143 lb (64.9 kg)  10/31/23 146 lb 4 oz (66.3 kg)  09/06/23 155 lb 8 oz (70.5 kg)    Physical Exam   Gen: [facility] NAD HEENT: NCAT, conjunctiva not injected, sclera nonicteric NECK:  supple, no thyromegaly, no nodes, no carotid bruits CARDIAC: tachy RRR, S1S2+, no murmur. DP 2+B  some chest wall tenderness LUNGS: CTAB. No wheezes ABDOMEN:  BS+, soft, mod upper abd tenderness, No HSM, no masses EXT:  no edema.  Some tender mobile nodules backs of thighs.  Some tenderness B calves and muscles roll some B.  MSK: no gross abnormalities.  NEURO: A&O x3.   CN II-XII intact.  PSYCH: normal mood. Good eye contact  EKG: nsr.  No acute st changes    Assessment & Plan:  Tachycardia -     TSH  Adjustment disorder with mixed anxiety and depressed mood -     Sertraline  HCl; Take 2.5 mLs (50 mg total) by mouth daily.  Dispense: 60 mL; Refill: 1 -     hydrOXYzine  Pamoate; Take 1 capsule (25 mg total) by mouth every 8 (eight) hours as needed.  Dispense: 90 capsule; Refill: 0  Bilateral leg pain -     CBC with Differential/Platelet -     Comprehensive metabolic panel with GFR -     Magnesium  -     Sedimentation rate -     Vitamin B12  Upper abdominal  pain -     CBC with Differential/Platelet -     Comprehensive metabolic panel with GFR -     Lipase -     Amylase  Vitamin D  deficiency -     VITAMIN D  25 Hydroxy (Vit-D Deficiency, Fractures)  Other chest pain -     EKG 12-Lead -     Ambulatory referral to Cardiology  Assessment and Plan Assessment & Plan Intermittent chest pain   She experiences intermittent chest pain at rest and with activity, lasting a few minutes, without shortness of breath, dizziness, or sweating. There is a family history of heart disease. Differential diagnosis includes musculoskeletal pain anxiety,cardiac etiology, or other with physical exam revealing chest tenderness. Order an EKG and refer to cardiology for further evaluation. Has had cholecystectomy  Tobacco use disorder   She recently resumed smoking due to stress, increasing health risks such as exacerbation of chest pain and cardiovascular issues.  Depression   She is not taking sertraline  due to gastrointestinal side effects and uses hydroxyzine  as needed for acute anxiety. No suicidal ideation is reported. Counseling sessions with her husband show limited progress. Restart sertraline  at half a tablet daily for one week, then increase to a full tablet if tolerated-pt can't swallow pills-advised to crush.  Will try liquid if covered on insurance. . Continue  hydroxyzine  as needed and consider liquid sertraline  if insurance approves. Continue counseling sessions.  Weight loss associated with medication   She continues to lose weight, potentially due to Mounjaro  (Zipfam) use, raising concerns about nutritional deficiencies and health risks. Her current weight is not underweight, but monitoring is necessary. Monitor weight closely, consider adjusting Mounjaro  dosing if weight loss continues, and encourage adequate nutrition and regular meals. Stress may be contributing as well.   Nutritional deficiency, unspecified   Potential nutritional deficiencies are suspected due to reduced food intake and weight loss, with previous low vitamin D  and B12 levels noted. Current dietary intake may be insufficient. Take multivitamins regularly, ensure adequate nutrition and hydration, and monitor for deficiency symptoms.  Muscle pain and palpable lumps in legs   Chronic muscle pain with palpable lumps in the legs has worsened over the past few months, disrupting sleep. No concerning findings on physical exam(suspect lipomas), but further evaluation is warranted. Add electrolytes to water  intake, ensure adequate hydration and nutrition, take multivitamins regularly, and refer to sports medicine for further evaluation.    Return in about 4 weeks (around 12/22/2023) for mood.  Jenkins CHRISTELLA Carrel, MD

## 2023-11-24 NOTE — Progress Notes (Signed)
 Labs ok except vitamin d  is low-take 2000iu/d otc B12 lower end of normal-take B vitamins

## 2023-11-24 NOTE — Patient Instructions (Addendum)
 It was very nice to see you today!  Take there sertraline  Add electrolytes to water .  [facility]  [address] on the 1st floor Phone number [phone]    PLEASE NOTE:  If you had any lab tests please let us  know if you have not heard back within a few days. You may see your results on MyChart before we have a chance to review them but we will give you a call once they are reviewed by us . If we ordered any referrals today, please let us  know if you have not heard from their office within the next week.   Please try these tips to maintain a healthy lifestyle:  Eat most of your calories during the day when you are active. Eliminate processed foods including packaged sweets (pies, cakes, cookies), reduce intake of potatoes, white bread, white pasta, and white rice. Look for whole grain options, oat flour or almond flour.  Each meal should contain half fruits/vegetables, one quarter protein, and one quarter carbs (no bigger than a computer mouse).  Cut down on sweet beverages. This includes juice, soda, and sweet tea. Also watch fruit intake, though this is a healthier sweet option, it still contains natural sugar! Limit to 3 servings daily.  Drink at least 1 glass of water  with each meal and aim for at least 8 glasses per day  Exercise at least 150 minutes every week.

## 2023-12-11 ENCOUNTER — Other Ambulatory Visit (HOSPITAL_BASED_OUTPATIENT_CLINIC_OR_DEPARTMENT_OTHER): Payer: Self-pay

## 2023-12-13 ENCOUNTER — Ambulatory Visit: Admitting: Family Medicine

## 2023-12-17 ENCOUNTER — Other Ambulatory Visit: Payer: Self-pay | Admitting: Family Medicine

## 2023-12-17 DIAGNOSIS — F4323 Adjustment disorder with mixed anxiety and depressed mood: Secondary | ICD-10-CM

## 2023-12-24 ENCOUNTER — Encounter: Payer: Self-pay | Admitting: Family Medicine

## 2023-12-25 NOTE — Telephone Encounter (Signed)
 Please see message and advise smk

## 2023-12-27 ENCOUNTER — Ambulatory Visit: Admitting: Family Medicine

## 2023-12-28 ENCOUNTER — Ambulatory Visit (INDEPENDENT_AMBULATORY_CARE_PROVIDER_SITE_OTHER)

## 2023-12-28 ENCOUNTER — Ambulatory Visit

## 2023-12-28 ENCOUNTER — Ambulatory Visit: Admitting: Family Medicine

## 2023-12-28 ENCOUNTER — Encounter: Payer: Self-pay | Admitting: Family Medicine

## 2023-12-28 VITALS — BP 102/56 | HR 99 | Temp 97.6°F | Ht 67.0 in | Wt 137.2 lb

## 2023-12-28 DIAGNOSIS — E559 Vitamin D deficiency, unspecified: Secondary | ICD-10-CM

## 2023-12-28 DIAGNOSIS — F4323 Adjustment disorder with mixed anxiety and depressed mood: Secondary | ICD-10-CM | POA: Diagnosis not present

## 2023-12-28 DIAGNOSIS — R195 Other fecal abnormalities: Secondary | ICD-10-CM | POA: Diagnosis not present

## 2023-12-28 DIAGNOSIS — M79662 Pain in left lower leg: Secondary | ICD-10-CM

## 2023-12-28 DIAGNOSIS — M79661 Pain in right lower leg: Secondary | ICD-10-CM | POA: Diagnosis not present

## 2023-12-28 NOTE — Progress Notes (Signed)
 Subjective:     Patient ID: Angela Hartman, female    DOB: 1981/02/13, 43 y.o.   MRN: [id number]  Chief Complaint  Patient presents with   Anxiety   Depression    Pt is here for follow up, she also states she is having bad leg pain in both    Leg Pain    Discussed the use of AI scribe software for clinical note transcription with the patient, who gave verbal consent to proceed.  History of Present Illness Angela Hartman is a 43 year old female who presents with worsening leg pain and symptoms of anxiety and depression.  She has been experiencing worsening leg pain, particularly in the right leg, over the last few days. The pain is exacerbated by weight-bearing activities, causing her to limp at work. It is severe enough to have woken her up screaming on one occasion. The pain is primarily located in the calves but also affects the thighs. No swelling is noted, but there is an ice-cold sensation in her feet, described as 'ice cold like spiders,' with associated numbness. Heat application worsens the pain, while she has not tried ice. Tylenol  and ibuprofen  have not provided relief.  She is experiencing symptoms of anxiety and depression, including low energy, feeling 'blah,' and a lack of interest in upcoming holidays. She has restarted sertraline  but is not taking it consistently. No suicidal ideation, but she feels hurt and angry. She is not taking hydroxyzine  despite recognizing times when it could have been helpful. She is attending counseling but feels it is not making progress.  She reports significant stress related to her son's education and personal decisions, which she feels is contributing to her anxiety and depression. She is concerned about her nutrition, as she is not eating well, which she attributes to stress. When she does eat, it often results in immediate bowel movements or stomach pain. Her bowel movements are either very runny or contain 'string looking  stuff.' No vomiting, but she experiences nausea at times.  She is taking Flintstone vitamins but not specifically vitamin D  or B12 supplements. She is unsure if she has had a colonoscopy, but recalls one may have been scheduled last December;    Health Maintenance Due  Topic Date Due   HIV Screening  Never done   Hepatitis C Screening  Never done   Mammogram  Never done    Past Medical History:  Diagnosis Date   Abnormal Pap smear of cervix    has had several   Anemia    Anxiety    Chest pain 2018   Went to ER, EKG normal, due to anxiety per patient   Chronic constipation 06/06/2022   Gastroesophageal reflux disease 02/23/2022   Headache    migraines   History of hiatal hernia    Ulcer     Past Surgical History:  Procedure Laterality Date   ABDOMINAL HYSTERECTOMY     CHOLECYSTECTOMY     CYSTOCELE REPAIR N/A 05/06/2016   Procedure: Repair Vaginal Cuff Dehisance,;  Surgeon: Rosaline Luna, MD;  Location: [facility];  Service: Gynecology;  Laterality: N/A;   CYSTOSCOPY N/A 04/20/2016   Procedure: CYSTOSCOPY;  Surgeon: Rosaline Luna, MD;  Location: [facility];  Service: Gynecology;  Laterality: N/A;   CYSTOSCOPY  05/06/2016   Procedure: CYSTOSCOPY;  Surgeon: Rosaline Luna, MD;  Location: [facility];  Service: Gynecology;;   ESOPHAGEAL MANOMETRY N/A 05/04/2022   Procedure: ESOPHAGEAL MANOMETRY (EM);  Surgeon: Shila Gustav GAILS, MD;  Location: [facility];  Service: Gastroenterology;  Laterality: N/A;   ROBOTIC ASSISTED TOTAL HYSTERECTOMY WITH BILATERAL SALPINGO OOPHERECTOMY N/A 04/20/2016   Procedure: ROBOTIC ASSISTED TOTAL HYSTERECTOMY WITH BILATERAL SALPINGO OOPHORECTOMY;  Surgeon: Rosaline Luna, MD;  Location: [facility];  Service: Gynecology;  Laterality: N/A;   UPPER GASTROINTESTINAL ENDOSCOPY     WISDOM TOOTH EXTRACTION     XI ROBOTIC ASSISTED PARAESOPHAGEAL HERNIA REPAIR N/A 07/13/2022   Procedure: ROBOTIC PARAESOPHAGEAL HIATAL HERNIA REPAIR WITH TOUPE FUNDOPLICATION;   Surgeon: Sheldon Standing, MD;  Location: [facility];  Service: General;  Laterality: N/A;  GEN w/ [facility] LOCAL     Current Outpatient Medications:    hydrOXYzine  (VISTARIL ) 25 MG capsule, Take 1 capsule (25 mg total) by mouth every 8 (eight) hours as needed., Disp: 90 capsule, Rfl: 0   sertraline  (ZOLOFT ) 20 MG/ML concentrated solution, Take 2.5 mLs (50 mg total) by mouth daily., Disp: 60 mL, Rfl: 1   TYLENOL  DISSOLVE PACKS 500 MG PACK, Take 500-1,000 mg by mouth 2 (two) times daily as needed (for headaches)., Disp: , Rfl:   Allergies  Allergen Reactions   Other Other (See Comments)    CAN ONLY TAKE LIQUID MEDICATION- NO PILLS   Penicillins Hives and Other (See Comments)    Rash   Tape Rash   ROS neg/noncontributory except as noted HPI/below      Objective:     BP (!) 102/56 (BP Location: Left Arm, Patient Position: Sitting, Cuff Size: Normal)   Pulse 99   Temp 97.6 F (36.4 C) (Temporal)   Ht 5' 7 (1.702 m)   Wt 137 lb 4 oz (62.3 kg)   LMP 04/05/2016 (Exact Date)   SpO2 99%   BMI 21.50 kg/m  Wt Readings from Last 3 Encounters:  12/28/23 137 lb 4 oz (62.3 kg)  11/24/23 143 lb (64.9 kg)  10/31/23 146 lb 4 oz (66.3 kg)    Physical Exam GENERAL: Well developed, well nourished, no acute distress. Tired/depressed appearing HEAD EYES EARS NOSE THROAT: Normocephalic, atraumatic, conjunctiva not injected, sclera nonicteric. CARDIAC: tachy Regular rate and rhythm, S1 S2 present, no murmur, dorsalis pedis 1 plus bilaterally. NECK: Supple, no thyromegaly, no nodes, no carotid bruits. LUNGS: Clear to auscultation bilaterally, no wheezes. ABDOMEN: Bowel sounds present, soft, non-tender, non-distended, no hepatosplenomegaly, no masses. EXTREMITIES: Calves tender to palpation, bruising noted on L shin, no edema. MUSCULOSKELETAL: No gross abnormalities. No TTP spine, hips,shoulders NEUROLOGICAL: Alert and oriented x3, cranial nerves II through XII intact. PSYCHIATRIC: depressed mood, good  eye contact.   Reviewed labs.     Assessment & Plan:  Bilateral calf pain -     Ambulatory referral to Vascular Surgery -     DG Lumbar Spine 2-3 Views; Future -     DG Tibia/Fibula Left; Future -     DG Tibia/Fibula Right; Future  Change in stool  Adjustment disorder with mixed anxiety and depressed mood  Vitamin D  deficiency    Assessment and Plan Assessment & Plan Depression and anxiety   She experiences persistent symptoms of depression and anxiety, including lack of energy, feeling 'blah', and emotional distress. There is non-adherence to her sertraline  regimen, though she has no suicidal ideation. Stress from personal and family issues is present, and counseling has not shown significant progress. Encourage adherence to the sertraline  regimen and continue counseling sessions. Consider hydroxyzine  as needed for anxiety.  Pain in right lower leg and bilateral lower extremities   She reports severe throbbing pain in the right lower leg  and bilateral lower extremities, worsened by weight-bearing and heat, with no swelling. Differential diagnosis includes vascular and musculoskeletal causes, nutritional deficiency, other. A pain is not relieved by Tylenol  or ibuprofen . Ice may provide relief. Refer to sports medicine and a vascular specialist for further evaluation. Order an x-ray of the spine to assess musculoskeletal causes and advise vitamin D  supplementation.   Abnormal bowel movements and abdominal pain   She has abnormal bowel movements with a string-like appearance and abdominal pain, possibly stress-related or exacerbated by Zepbound . There is no vomiting, but nausea is present. A previous colonoscopy had inadequate prep, and a repeat is recommended in a year. Discontinue Zepbound  temporarily and contact a gastroenterologist for follow-up on the colonoscopy and further evaluation.  Poor nutrition and decreased oral intake   Decreased oral intake and poor nutrition contribute  to overall malaise, with stress and anxiety impacting eating habits. There has been a recent increase in food intake after social interaction. Encourage increased nutritional intake and discuss the importance of regular meals and snacks.  Vitamin D  deficiency   Vitamin D  levels are low, potentially contributing to musculoskeletal pain. Current supplementation with Flintstones vitamins is insufficient. Advise taking 2000 IU of vitamin D  daily.     Return in about 4 weeks (around 01/25/2024).  Jenkins CHRISTELLA Carrel, MD

## 2023-12-28 NOTE — Patient Instructions (Addendum)
 It was very nice to see you today!  Take  2000iu/d of vitamin D   [facility] at [facility]  [address] on the 1st floor Phone number [phone]   Take the sertraline   Referral to vascular  Stop the zepbound   Call the [phone]   PLEASE NOTE:  If you had any lab tests please let us  know if you have not heard back within a few days. You may see your results on MyChart before we have a chance to review them but we will give you a call once they are reviewed by us . If we ordered any referrals today, please let us  know if you have not heard from their office within the next week.   Please try these tips to maintain a healthy lifestyle:  Eat most of your calories during the day when you are active. Eliminate processed foods including packaged sweets (pies, cakes, cookies), reduce intake of potatoes, white bread, white pasta, and white rice. Look for whole grain options, oat flour or almond flour.  Each meal should contain half fruits/vegetables, one quarter protein, and one quarter carbs (no bigger than a computer mouse).  Cut down on sweet beverages. This includes juice, soda, and sweet tea. Also watch fruit intake, though this is a healthier sweet option, it still contains natural sugar! Limit to 3 servings daily.  Drink at least 1 glass of water  with each meal and aim for at least 8 glasses per day  Exercise at least 150 minutes every week.

## 2023-12-29 ENCOUNTER — Ambulatory Visit: Admitting: Family Medicine

## 2024-01-01 ENCOUNTER — Other Ambulatory Visit: Payer: Self-pay | Admitting: Surgery

## 2024-01-01 DIAGNOSIS — M79604 Pain in right leg: Secondary | ICD-10-CM

## 2024-01-03 ENCOUNTER — Ambulatory Visit: Payer: Self-pay | Admitting: Family Medicine

## 2024-01-03 DIAGNOSIS — M79604 Pain in right leg: Secondary | ICD-10-CM

## 2024-01-03 DIAGNOSIS — D1622 Benign neoplasm of long bones of left lower limb: Secondary | ICD-10-CM

## 2024-01-03 NOTE — Progress Notes (Signed)
 Spoke to pt on 11/12 re xrays.  Spine, monitor-arthritis type things.   Abnormality L leg.  Will get MRI

## 2024-01-15 ENCOUNTER — Ambulatory Visit
Admission: RE | Admit: 2024-01-15 | Discharge: 2024-01-15 | Disposition: A | Source: Ambulatory Visit | Attending: Family Medicine | Admitting: Family Medicine

## 2024-01-15 DIAGNOSIS — M79604 Pain in right leg: Secondary | ICD-10-CM

## 2024-01-15 DIAGNOSIS — D1622 Benign neoplasm of long bones of left lower limb: Secondary | ICD-10-CM

## 2024-01-15 MED ORDER — GADOPICLENOL 0.5 MMOL/ML IV SOLN
6.0000 mL | Freq: Once | INTRAVENOUS | Status: AC | PRN
  Administered 2024-01-15: 6 mL via INTRAVENOUS

## 2024-01-28 ENCOUNTER — Ambulatory Visit: Payer: Self-pay | Admitting: Family Medicine

## 2024-01-29 ENCOUNTER — Ambulatory Visit: Admitting: Surgery

## 2024-01-29 ENCOUNTER — Encounter: Payer: Self-pay | Admitting: Family Medicine

## 2024-01-29 ENCOUNTER — Ambulatory Visit: Admitting: Family Medicine

## 2024-01-29 ENCOUNTER — Ambulatory Visit (HOSPITAL_COMMUNITY)
Admission: RE | Admit: 2024-01-29 | Discharge: 2024-01-29 | Disposition: A | Source: Ambulatory Visit | Attending: Surgery | Admitting: Surgery

## 2024-01-29 ENCOUNTER — Encounter: Payer: Self-pay | Admitting: Surgery

## 2024-01-29 VITALS — BP 102/69 | HR 76 | Temp 98.1°F | Ht 67.0 in | Wt 135.0 lb

## 2024-01-29 VITALS — BP 108/66 | HR 85 | Temp 97.7°F | Ht 67.0 in | Wt 136.1 lb

## 2024-01-29 DIAGNOSIS — F4323 Adjustment disorder with mixed anxiety and depressed mood: Secondary | ICD-10-CM

## 2024-01-29 DIAGNOSIS — M2550 Pain in unspecified joint: Secondary | ICD-10-CM

## 2024-01-29 DIAGNOSIS — M25561 Pain in right knee: Secondary | ICD-10-CM

## 2024-01-29 DIAGNOSIS — M25562 Pain in left knee: Secondary | ICD-10-CM

## 2024-01-29 DIAGNOSIS — M79604 Pain in right leg: Secondary | ICD-10-CM

## 2024-01-29 LAB — CBC WITH DIFFERENTIAL/PLATELET
Basophils Absolute: 0.1 K/uL (ref 0.0–0.1)
Basophils Relative: 1.1 % (ref 0.0–3.0)
Eosinophils Absolute: 0.2 K/uL (ref 0.0–0.7)
Eosinophils Relative: 2.9 % (ref 0.0–5.0)
HCT: 40.2 % (ref 36.0–46.0)
Hemoglobin: 13.4 g/dL (ref 12.0–15.0)
Lymphocytes Relative: 40.8 % (ref 12.0–46.0)
Lymphs Abs: 2.4 K/uL (ref 0.7–4.0)
MCHC: 33.4 g/dL (ref 30.0–36.0)
MCV: 87.7 fl (ref 78.0–100.0)
Monocytes Absolute: 0.3 K/uL (ref 0.1–1.0)
Monocytes Relative: 5.8 % (ref 3.0–12.0)
Neutro Abs: 2.9 K/uL (ref 1.4–7.7)
Neutrophils Relative %: 49.4 % (ref 43.0–77.0)
Platelets: 262 K/uL (ref 150.0–400.0)
RBC: 4.59 Mil/uL (ref 3.87–5.11)
RDW: 13.6 % (ref 11.5–15.5)
WBC: 5.9 K/uL (ref 4.0–10.5)

## 2024-01-29 LAB — C-REACTIVE PROTEIN: CRP: <0.5 mg/dL (ref 0.5–20.0)

## 2024-01-29 LAB — VAS US ABI WITH/WO TBI
Left ABI: 1.47
Right ABI: 1.6

## 2024-01-29 LAB — SEDIMENTATION RATE: Sed Rate: 3 mm/h (ref 0–20)

## 2024-01-29 LAB — CK: Total CK: 55 U/L (ref 17–177)

## 2024-01-29 MED ORDER — MIRTAZAPINE 15 MG PO TBDP: 15.0000 mg | ORAL_TABLET | Freq: Every day | ORAL | 2 refills | Status: AC

## 2024-01-29 MED ORDER — PREDNISONE 5 MG/5ML PO SOLN: 40.0000 mg | Freq: Every day | ORAL | 0 refills | Status: AC

## 2024-01-29 MED ORDER — HYDROXYZINE PAMOATE 25 MG PO CAPS: 25.0000 mg | ORAL_CAPSULE | Freq: Three times a day (TID) | ORAL | 1 refills | Status: AC | PRN

## 2024-01-29 NOTE — Progress Notes (Signed)
 Subjective:     Patient ID: Angela Hartman, female    DOB: August 06, 1980, 43 y.o.   MRN: [id number]  Chief Complaint  Patient presents with   Leg Pain    Pt states calf pain is worse    Discussed the use of AI scribe software for clinical note transcription with the patient, who gave verbal consent to proceed.  History of Present Illness Angela Hartman is a 43 year old female who presents with worsening leg pain and new onset arm pain.  She experiences constant pain primarily in her right calf, with additional pain in both legs. The pain worsens with touch, such as when her husband brushes against it in his sleep. Ibuprofen  has not provided significant relief. She saw vascular today-ok  Two days ago, she began experiencing pain in her left arm, which she initially attributed to sleeping on it wrong. The pain extends into her elbow and has not subsided. Additionally, she reports pain in her middle back extending into her shoulders.  She has been experiencing mood issues and reports that her mood is 'not good.' She is not currently taking sertraline .  Taking hydroxyzine  prn for anxiety.  She also reports difficulty sleeping, despite taking hydroxyzine  as needed for anxiety .  Her son is experiencing personal issues, which are causing her additional stress. He has decided to quit college and baseball, opting to take online courses instead. This situation is contributing to her overall stress and anxiety.  She works night shifts, which affects her sleep pattern. She finds it difficult to switch between night and day shifts, impacting her ability to sleep well.  She reports that her vitamin D  levels are low, but she is not currently taking vitamin D  supplements. Her magnesium  levels are normal. No significant weight change, and she states she has been eating better.    Health Maintenance Due  Topic Date Due   HIV Screening  Never done   Hepatitis C Screening  Never done    Mammogram  Never done    Past Medical History:  Diagnosis Date   Abnormal Pap smear of cervix    has had several   Anemia    Anxiety    Chest pain 2018   Went to ER, EKG normal, due to anxiety per patient   Chronic constipation 06/06/2022   Gastroesophageal reflux disease 02/23/2022   Headache    migraines   History of hiatal hernia    Ulcer     Past Surgical History:  Procedure Laterality Date   ABDOMINAL HYSTERECTOMY     CHOLECYSTECTOMY     CYSTOCELE REPAIR N/A 05/06/2016   Procedure: Repair Vaginal Cuff Dehisance,;  Surgeon: Rosaline Luna, MD;  Location: [facility];  Service: Gynecology;  Laterality: N/A;   CYSTOSCOPY N/A 04/20/2016   Procedure: CYSTOSCOPY;  Surgeon: Rosaline Luna, MD;  Location: [facility];  Service: Gynecology;  Laterality: N/A;   CYSTOSCOPY  05/06/2016   Procedure: CYSTOSCOPY;  Surgeon: Rosaline Luna, MD;  Location: [facility];  Service: Gynecology;;   ESOPHAGEAL MANOMETRY N/A 05/04/2022   Procedure: ESOPHAGEAL MANOMETRY (EM);  Surgeon: Shila Gustav GAILS, MD;  Location: [facility];  Service: Gastroenterology;  Laterality: N/A;   ROBOTIC ASSISTED TOTAL HYSTERECTOMY WITH BILATERAL SALPINGO OOPHERECTOMY N/A 04/20/2016   Procedure: ROBOTIC ASSISTED TOTAL HYSTERECTOMY WITH BILATERAL SALPINGO OOPHORECTOMY;  Surgeon: Rosaline Luna, MD;  Location: [facility];  Service: Gynecology;  Laterality: N/A;   UPPER GASTROINTESTINAL ENDOSCOPY     WISDOM TOOTH EXTRACTION  XI ROBOTIC ASSISTED PARAESOPHAGEAL HERNIA REPAIR N/A 07/13/2022   Procedure: ROBOTIC PARAESOPHAGEAL HIATAL HERNIA REPAIR WITH TOUPE FUNDOPLICATION;  Surgeon: Sheldon Standing, MD;  Location: [facility];  Service: General;  Laterality: N/A;  GEN w/ [facility] LOCAL     Current Outpatient Medications:    mirtazapine  (REMERON  SOL-TAB) 15 MG disintegrating tablet, Take 1 tablet (15 mg total) by mouth at bedtime., Disp: 30 tablet, Rfl: 2   predniSONE  5 MG/5ML solution, Take 40 mLs (40 mg total) by mouth daily with  breakfast for 5 days., Disp: 200 mL, Rfl: 0   TYLENOL  DISSOLVE PACKS 500 MG PACK, Take 500-1,000 mg by mouth 2 (two) times daily as needed (for headaches)., Disp: , Rfl:    hydrOXYzine  (VISTARIL ) 25 MG capsule, Take 1 capsule (25 mg total) by mouth every 8 (eight) hours as needed., Disp: 90 capsule, Rfl: 1  Allergies  Allergen Reactions   Other Other (See Comments)    CAN ONLY TAKE LIQUID MEDICATION- NO PILLS   Penicillins Hives and Other (See Comments)    Rash   Tape Rash   ROS neg/noncontributory except as noted HPI/below      Objective:     BP 108/66 (BP Location: Left Arm, Patient Position: Sitting, Cuff Size: Normal)   Pulse 85   Temp 97.7 F (36.5 C) (Temporal)   Ht 5' 7 (1.702 m)   Wt 136 lb 2 oz (61.7 kg)   LMP 04/05/2016 (Exact Date)   SpO2 98%   BMI 21.32 kg/m  Wt Readings from Last 3 Encounters:  01/29/24 136 lb 2 oz (61.7 kg)  01/29/24 135 lb (61.2 kg)  12/28/23 137 lb 4 oz (62.3 kg)    Physical Exam GENERAL: Well developed, well nourished, no acute distress. HEAD EYES EARS NOSE THROAT: Normocephalic, atraumatic, conjunctiva not injected, sclera nonicteric.SABRA EXTREMITIES: No edema. MUSCULOSKELETAL: Tenderness in posterior shoulder muscles, upper and middle back. Mild tenderness in left calf, mod-severe tenderness in right calf, worse distally. Muscle strength 5/5 in all four extremities. NEUROLOGICAL: Alert and oriented x3, cranial nerves II through XII intact. PSYCHIATRIC: Normal mood, good eye contact.   Disc MRI    Assessment & Plan:  Polyarthralgia -     C-reactive protein -     CK -     ANA w/Reflex if Positive -     Rheumatoid factor -     Sedimentation rate -     CBC with Differential/Platelet  Adjustment disorder with mixed anxiety and depressed mood -     hydrOXYzine  Pamoate; Take 1 capsule (25 mg total) by mouth every 8 (eight) hours as needed.  Dispense: 90 capsule; Refill: 1  Other orders -     Mirtazapine ; Take 1 tablet (15 mg total)  by mouth at bedtime.  Dispense: 30 tablet; Refill: 2 -     predniSONE ; Take 40 mLs (40 mg total) by mouth daily with breakfast for 5 days.  Dispense: 200 mL; Refill: 0    Assessment and Plan Assessment & Plan Polyarthralgia   Chronic polyarthralgia presents with severe pain in the right calf, extending to the left arm, middle back, and shoulders. The pain is constant and worsens with touch. A previous MRI L lower leg revealed an-osteocondroma, which does not explain the symptoms. Differential diagnosis includes inflammatory arthritis, lupus, or other inflammatory conditions. Basic labs and inflammatory markers were normal, but further testing is needed. Vascular causes have been ruled out. Additional labs have been ordered to rule out lupus and inflammatory arthritis. Prednisone  is  prescribed to assess response to inflammation. She is referred to sports medicine for further evaluation and management. A spine MRI will be considered if recommended by sports medicine.  Adjustment disorder with mixed anxiety and depressed mood   She has an ongoing adjustment disorder with mixed anxiety and depressed mood. Current management includes as-needed hydroxyzine  for anxiety and sleep, but it is not effective for mood stabilization. Sertraline  (Zoloft ) is not being taken. Sleep disturbances persist. . Hydroxyzine  should be taken for sleep and anxiety as needed. Adding mirtazapine  for sleep and mood stabilization will be considered, with a liquid formulation if needed.  Vitamin D  deficiency   Vitamin D  deficiency is noted but not critically low. She is not currently taking vitamin D  supplements. Starting vitamin D  supplementation is recommended.     Return in about 4 weeks (around 02/26/2024) for chronic follow-up.  Jenkins CHRISTELLA Carrel, MD

## 2024-01-29 NOTE — Patient Instructions (Signed)
[facility]  [address] on the 1st floor Phone number [phone]  

## 2024-01-29 NOTE — Progress Notes (Signed)
 [facility] [place]  Patient name: Angela Hartman MRN: [id number] DOB: [birth year] Sex: female   REQUESTING PROVIDER:    Ann Marie Kulic-Pain   REASON FOR CONSULT:    Leg pain  HISTORY OF PRESENT ILLNESS:   Angela Hartman is a 43 y.o. female, who is referred for bilateral leg pain, right greater than left for several months.  The whole leg hurts on the right but it is intensified in the calf.  It is tender to the touch.  She does not have any open wounds.  She has had x-rays that showed a osteochondroma on the left which was confirmed with MRI.  The right leg x-rays were unremarkable  PAST MEDICAL HISTORY    Past Medical History:  Diagnosis Date   Abnormal Pap smear of cervix    has had several   Anemia    Anxiety    Chest pain 2018   Went to ER, EKG normal, due to anxiety per patient   Chronic constipation 06/06/2022   Gastroesophageal reflux disease 02/23/2022   Headache    migraines   History of hiatal hernia    Ulcer      FAMILY HISTORY   Family History  Problem Relation Age of Onset   Hypertension Mother    Heart disease Father 30       CAD   Heart attack Father    Diabetes Paternal Grandmother    Esophageal cancer Neg Hx    Colon cancer Neg Hx    Rectal cancer Neg Hx    Stomach cancer Neg Hx    Colon polyps Neg Hx     SOCIAL HISTORY:   Social History   Socioeconomic History   Marital status: Married    Spouse name: Not on file   Number of children: 2   Years of education: Not on file   Highest education level: Not on file  Occupational History   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   Social Drivers of Corporate Investment Banker Strain: Not on file  Food Insecurity: Not on file  Transportation Needs: Not on file  Physical Activity: Not on file  Stress: Not on file  Social Connections: Not on file  Intimate Partner Violence: Not on file    ALLERGIES:    Allergies  Allergen Reactions   Other Other (See Comments)    CAN ONLY TAKE LIQUID MEDICATION- NO PILLS   Penicillins Hives and Other (See Comments)    Rash   Tape Rash    CURRENT MEDICATIONS:    Current Outpatient Medications  Medication Sig Dispense Refill   hydrOXYzine  (VISTARIL ) 25 MG capsule Take 1 capsule (25 mg total) by mouth every 8 (eight) hours as needed. 90 capsule 0   sertraline  (ZOLOFT ) 20 MG/ML concentrated solution Take 2.5 mLs (50 mg total) by mouth daily. 60 mL 1   TYLENOL  DISSOLVE PACKS 500 MG PACK Take 500-1,000 mg by mouth 2 (two) times daily as  needed (for headaches).     No current facility-administered medications for this visit.    REVIEW OF SYSTEMS:   [X]  denotes positive finding, [ ]  denotes negative finding Cardiac  Comments:  Chest pain or chest pressure:    Shortness of breath upon exertion:    Short of breath when lying flat:    Irregular heart rhythm:        Vascular    Pain in calf, thigh, or hip brought on by ambulation:    Pain in feet at night that wakes you up from your sleep:     Blood clot in your veins:    Leg swelling:         Pulmonary    Oxygen at home:    Productive cough:     Wheezing:         Neurologic    Sudden weakness in arms or legs:     Sudden numbness in arms or legs:     Sudden onset of difficulty speaking or slurred speech:    Temporary loss of vision in one eye:     Problems with dizziness:         Gastrointestinal    Blood in stool:      Vomited blood:         Genitourinary    Burning when  urinating:     Blood in urine:        Psychiatric    Major depression:         Hematologic    Bleeding problems:    Problems with blood clotting too easily:        Skin    Rashes or ulcers:        Constitutional    Fever or chills:     PHYSICAL EXAM:   Vitals:   01/29/24 0908  BP: 102/69  Pulse: 76  Temp: 98.1 F (36.7 C)  Weight: 135 lb (61.2 kg)  Height: 5' 7 (1.702 m)    GENERAL: The patient is a well-nourished female, in no acute distress. The vital signs are documented above. CARDIAC: There is a regular rate and rhythm.  VASCULAR: Palpable pedal pulses PULMONARY: Nonlabored respirations ABDOMEN: Soft and non-tender with normal pitched bowel sounds.  MUSCULOSKELETAL: Left leg and calf are tender to palpation NEUROLOGIC: No focal weakness or paresthesias are detected. SKIN: There are no ulcers or rashes noted. PSYCHIATRIC: The patient has a normal affect.  STUDIES:   I have reviewed the following: ABI/TBIToday's ABIToday's TBIPrevious ABIPrevious TBI  +-------+-----------+-----------+------------+------------+  Right 1.60       1.13                                 +-------+-----------+-----------+------------+------------+  Left  1.47       1.12                                 +-------+-----------+-----------+------------+------------+  Right toe pressure: 96 Left toe pressure: 95 All waveforms are triphasic  ASSESSMENT and PLAN   Leg pain: The patient was reassured today that her symptoms are not related to arterial insufficiency.  She has palpable pedal pulses and normal ABIs with triphasic waveforms.  I suspect that this is most likely musculoskeletal.  Alternatively, this could be progression of her lower back degenerative disc disease.   Malvina Serene CLORE, MD, FACS [facility] [phone] Pager [phone]

## 2024-01-30 ENCOUNTER — Ambulatory Visit: Payer: Self-pay | Admitting: Family Medicine

## 2024-01-30 LAB — ANA W/REFLEX IF POSITIVE: Anti Nuclear Antibody (ANA): NEGATIVE

## 2024-01-30 LAB — RHEUMATOID FACTOR: Rheumatoid fact SerPl-aCnc: <10 [IU]/mL (ref <14)

## 2024-01-30 NOTE — Progress Notes (Signed)
 Pt has read results smk

## 2024-01-30 NOTE — Progress Notes (Signed)
 Labs are all normal.

## 2024-02-06 ENCOUNTER — Encounter (HOSPITAL_BASED_OUTPATIENT_CLINIC_OR_DEPARTMENT_OTHER): Payer: Self-pay

## 2024-02-07 ENCOUNTER — Encounter (HOSPITAL_BASED_OUTPATIENT_CLINIC_OR_DEPARTMENT_OTHER): Payer: Self-pay | Admitting: Cardiology

## 2024-02-07 ENCOUNTER — Ambulatory Visit (HOSPITAL_BASED_OUTPATIENT_CLINIC_OR_DEPARTMENT_OTHER): Admitting: Cardiology

## 2024-02-07 VITALS — BP 92/58 | HR 97 | Ht 67.0 in | Wt 140.2 lb

## 2024-02-07 DIAGNOSIS — Z720 Tobacco use: Secondary | ICD-10-CM

## 2024-02-07 DIAGNOSIS — Z7189 Other specified counseling: Secondary | ICD-10-CM | POA: Diagnosis not present

## 2024-02-07 DIAGNOSIS — R079 Chest pain, unspecified: Secondary | ICD-10-CM

## 2024-02-07 DIAGNOSIS — Z8249 Family history of ischemic heart disease and other diseases of the circulatory system: Secondary | ICD-10-CM

## 2024-02-07 NOTE — Progress Notes (Signed)
 " Cardiology Office Note:  .   Date:  02/07/2024  ID:  Angela Hartman, DOB 1980/07/18, MRN [id number] PCP: Wendolyn Jenkins Jansky, MD  [facility] Providers Cardiologist:  Shelda Bruckner, MD {  History of Present Illness: .   Angela Hartman is a 43 y.o. female with PMH tobacco abuse, family history of heart disease who is seen as a new patient consultation at the request of Dr. Wendolyn for chest pain.  Referral from 11/24/23 reviewed. Noted intermittent chest pain that is random but can be severe, lasts 1-2  minutes, no associated symptoms.   Patient feels that symptoms are likely stress/anxiety related. Has attacks where she can't catch her breath, has tightness in her chest. Has been less frequent over the last month or so (less stress), but was worst at the end of September. Started at the end of August. Nothing seems to make it better. Not related to activity, position, food, etc. Also has diffuse MSK pain.  No prior history of personal cardiac issues. Is a current smoker. Has family history of heart disease in her father around age 55.  ROS: Denies PND, orthopnea, LE edema or unexpected weight gain. No syncope or palpitations. ROS otherwise negative except as noted.   Studies Reviewed: SABRA    EKG:       Physical Exam:   VS:  BP (!) 92/58   Pulse 97   Ht 5' 7 (1.702 m)   Wt 140 lb 3.2 oz (63.6 kg)   LMP 04/05/2016   SpO2 98%   BMI 21.96 kg/m    Wt Readings from Last 3 Encounters:  02/07/24 140 lb 3.2 oz (63.6 kg)  01/29/24 136 lb 2 oz (61.7 kg)  01/29/24 135 lb (61.2 kg)    GEN: Well nourished, well developed in no acute distress HEENT: Normal, moist mucous membranes NECK: No JVD CARDIAC: regular rhythm, normal S1 and S2, no rubs or gallops. No murmur. VASCULAR: Radial and DP pulses 2+ bilaterally. No carotid bruits RESPIRATORY:  Clear to auscultation without rales, wheezing or rhonchi  ABDOMEN: Soft, non-tender, non-distended MUSCULOSKELETAL:   Ambulates independently SKIN: Warm and dry, no edema NEUROLOGIC:  Alert and oriented x 3. No focal neuro deficits noted. PSYCHIATRIC:  Normal affect    ASSESSMENT AND PLAN: .    Chest pain Family history of heart disease Tobacco use -discussed no testing, treadmill, or coronary CT. With her family history, she would like to know about plaque burden, so we discussed CT. Did discuss that we need a slower heart rate for a good test and enough blood pressure to use sublingual nitroglycerin -she cannot swallow pills -has chronically low blood pressure  -she is most interested in CT. However, her blood pressure is low, and her resting heart rate is high. Cannot use metoprolol  or diltiazem given blood pressure, would have no room for nitroglycerin -I will speak to pharmacy to see if ivabradine can be crushed. UPDATED TO ADD: recommended not to crush ivabradine -if we cannot control heart rate and/or cannot use nitroglycerin due to low blood pressure, we discussed treadmill stress testing as alternative -reviewed red flag warning signs that need immediate medical attention -recommend tobacco cessation  Informed Consent  for treadmill stress, in case CT cannot be done: Shared Decision Making/Informed Consent{ The risks [chest pain, shortness of breath, cardiac arrhythmias, dizziness, blood pressure fluctuations, myocardial infarction, stroke/transient ischemic attack, and life-threatening complications (estimated to be 1 in 10,000)], benefits (risk stratification, diagnosing coronary artery disease, treatment  guidance) and alternatives of an exercise tolerance test were discussed in detail with Angela Hartman and she agrees to proceed.      CV risk counseling and prevention -recommend heart healthy/Mediterranean diet, with whole grains, fruits, vegetable, fish, lean meats, nuts, and olive oil. Limit salt. -recommend moderate walking, 3-5 times/week for 30-50 minutes each session. Aim for at least 150  minutes/week. Goal should be pace of 3 miles/hours, or walking 1.5 miles in 30 minutes -recommend avoidance of tobacco products. Avoid excess alcohol. -ASCVD risk score: The 10-year ASCVD risk score (Arnett DK, et al., 2019) is: 0.7%   Values used to calculate the score:     Age: 59 years     Clinically relevant sex: Female     Is Non-Hispanic African American: No     Diabetic: No     Tobacco smoker: Yes     Systolic Blood Pressure: 92 mmHg     Is BP treated: No     HDL Cholesterol: 58.9 mg/dL     Total Cholesterol: 135 mg/dL    Dispo: will ask if ivabradine can be crushed. Backup would be treadmill  Signed, Shelda Bruckner, MD   Shelda Bruckner, MD, PhD, [facility]  [facility]  [place]  [facility] at [facility] [address] [place], [place] [address] [phone]   "

## 2024-02-07 NOTE — Patient Instructions (Signed)
 Medication Instructions:  No changes today *If you need a refill on your cardiac medications before your next appointment, please call your pharmacy*  Lab Work: None today If you have labs (blood work) drawn today and your tests are completely normal, you will receive your results only by: MyChart Message (if you have MyChart) OR A paper copy in the mail If you have any lab test that is abnormal or we need to change your treatment, we will call you to review the results.  Testing/Procedures: None ordered today  Follow-Up: To be decided.

## 2024-02-09 ENCOUNTER — Encounter: Payer: Self-pay | Admitting: Family Medicine

## 2024-02-12 ENCOUNTER — Other Ambulatory Visit: Payer: Self-pay | Admitting: Family Medicine

## 2024-02-12 DIAGNOSIS — E66811 Obesity, class 1: Secondary | ICD-10-CM

## 2024-02-13 ENCOUNTER — Other Ambulatory Visit: Payer: Self-pay

## 2024-02-13 ENCOUNTER — Other Ambulatory Visit: Payer: Self-pay | Admitting: Family Medicine

## 2024-02-13 ENCOUNTER — Other Ambulatory Visit (HOSPITAL_COMMUNITY): Payer: Self-pay

## 2024-02-13 MED ORDER — ZEPBOUND 7.5 MG/0.5ML ~~LOC~~ SOAJ
7.5000 mg | SUBCUTANEOUS | 0 refills | Status: DC
  Filled 2024-02-13: qty 2, 28d supply, fill #0

## 2024-02-13 MED ORDER — ZEPBOUND 7.5 MG/0.5ML ~~LOC~~ SOAJ: 7.5000 mg | SUBCUTANEOUS | 0 refills | Status: DC

## 2024-02-13 NOTE — Progress Notes (Signed)
 "  Chief Complaint: Abdominal pain and irregular bowel movements  HPI:    Angela Hartman is a 43 year old Caucasian female known to Dr. Shila, with a past medical history as listed below including anxiety, chronic constipation and GERD on Zepbound , who was referred to me by Wendolyn Jenkins Jansky, MD for a complaint of abdominal pain and irregular bowel movements.    04/04/2019 EGD with grade C esophagitis and large hiatal hernia measuring about 6 cm as well as a single 5 mm AVM in the gastric body that was nonbleeding.     04/13/2022 EGD with esophageal ulcers, 6 cm hiatal hernia with a few Cameron ulcers and erythematous duodenopathy.  The point patient referred to [facility] for hiatal hernia repair and Nissen.  Recommended CT chest and esophageal manometry.  Continue on Prevacid  twice daily and Carafate  4 times daily.    05/20/2022 esophageal manometry with normal relaxation at the EG junction.  No significant esophageal peristaltic abnormality.    06/24/2022 gastric emptying study normal.    07/14/2022 esophagram with interval hiatal hernia repair and fundoplication without evidence of complication.  Mild narrowing at the GE junction consistent with postoperative edema.    11/30/2022 office visit with me for diarrhea for the past 4 months ever since Nissen fundoplication.  Patient scheduled for colonoscopy.  Prescribed hyoscyamine .    11/24/2023 vitamin B12, amylase, lipase, ESR, magnesium , TSH, CMP, CBC all normal.  Vit D decreased at 20.97.  Patient told to take 2000 IU/day OTC.    01/29/2024 CBC, ESR, rheumatoid factor, ANA, CK and CRP all normal.    02/03/2023 colonoscopy done for diarrhea with inadequate prep of the colon.  Nonbleeding external and internal hemorrhoids.  Repeat recommended in a year for screening due to inadequate prep.    Today, patient tells me she continues with diarrhea anytime that she eats.  Very very occasionally she will have a solid stool and when she does it seems like it has a band of  mucus trapped around it like a string.  Tells me that she is on Zepbound  for weight loss but she came off of it about 3 weeks ago and noticed that her diarrhea improved slightly, she then restarted it and took another shot last night and her diarrhea came back.  Was never completely gone but seems slightly better when she is not on Zepbound .  Reminds me she had a cholecystectomy in the past.    Denies fever, chills or intentional weight loss.  Past Medical History:  Diagnosis Date   Abnormal Pap smear of cervix    has had several   Anemia    Anxiety    Chest pain 2018   Went to ER, EKG normal, due to anxiety per patient   Chronic constipation 06/06/2022   Gastroesophageal reflux disease 02/23/2022   Headache    migraines   History of hiatal hernia    Ulcer     Past Surgical History:  Procedure Laterality Date   ABDOMINAL HYSTERECTOMY     CHOLECYSTECTOMY     CYSTOCELE REPAIR N/A 05/06/2016   Procedure: Repair Vaginal Cuff Dehisance,;  Surgeon: Rosaline Luna, MD;  Location: [facility];  Service: Gynecology;  Laterality: N/A;   CYSTOSCOPY N/A 04/20/2016   Procedure: CYSTOSCOPY;  Surgeon: Rosaline Luna, MD;  Location: [facility];  Service: Gynecology;  Laterality: N/A;   CYSTOSCOPY  05/06/2016   Procedure: CYSTOSCOPY;  Surgeon: Rosaline Luna, MD;  Location: [facility];  Service: Gynecology;;   ESOPHAGEAL MANOMETRY N/A 05/04/2022  Procedure: ESOPHAGEAL MANOMETRY (EM);  Surgeon: Shila Gustav GAILS, MD;  Location: [facility];  Service: Gastroenterology;  Laterality: N/A;   ROBOTIC ASSISTED TOTAL HYSTERECTOMY WITH BILATERAL SALPINGO OOPHERECTOMY N/A 04/20/2016   Procedure: ROBOTIC ASSISTED TOTAL HYSTERECTOMY WITH BILATERAL SALPINGO OOPHORECTOMY;  Surgeon: Rosaline Luna, MD;  Location: [facility];  Service: Gynecology;  Laterality: N/A;   UPPER GASTROINTESTINAL ENDOSCOPY     WISDOM TOOTH EXTRACTION     XI ROBOTIC ASSISTED PARAESOPHAGEAL HERNIA REPAIR N/A 07/13/2022   Procedure: ROBOTIC  PARAESOPHAGEAL HIATAL HERNIA REPAIR WITH TOUPE FUNDOPLICATION;  Surgeon: Sheldon Standing, MD;  Location: [facility];  Service: General;  Laterality: N/A;  GEN w/ [facility] LOCAL    Current Outpatient Medications  Medication Sig Dispense Refill   hydrOXYzine  (VISTARIL ) 25 MG capsule Take 1 capsule (25 mg total) by mouth every 8 (eight) hours as needed. 90 capsule 1   mirtazapine  (REMERON  SOL-TAB) 15 MG disintegrating tablet Take 1 tablet (15 mg total) by mouth at bedtime. 30 tablet 2   tirzepatide  (ZEPBOUND ) 7.5 MG/0.5ML Pen Inject 7.5 mg into the skin once a week. 2 mL 0   TYLENOL  DISSOLVE PACKS 500 MG PACK Take 500-1,000 mg by mouth 2 (two) times daily as needed (for headaches).     No current facility-administered medications for this visit.    Allergies as of 02/14/2024 - Review Complete 02/07/2024  Allergen Reaction Noted   Other Other (See Comments) 04/12/2016   Penicillins Hives and Other (See Comments) 06/19/2012   Tape Rash 01/19/2022    Family History  Problem Relation Age of Onset   Hypertension Mother    Heart disease Father 9       CAD   Heart attack Father    Diabetes Paternal Grandmother    Esophageal cancer Neg Hx    Colon cancer Neg Hx    Rectal cancer Neg Hx    Stomach cancer Neg Hx    Colon polyps Neg Hx     Social History   Socioeconomic History   Marital status: Married    Spouse name: Not on file   Number of children: 2   Years of education: Not on file   Highest education level: Not on file  Occupational History   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   Social Drivers of Health   Tobacco Use: High Risk (02/07/2024)   Patient History    Smoking Tobacco Use: Every Day    Smokeless Tobacco Use: Never    Passive Exposure: Not on file  Financial Resource Strain: Not on file  Food Insecurity: Not on file  Transportation Needs: Not on file  Physical Activity: Not on file  Stress: Not on file  Social Connections: Not on file  Intimate Partner Violence: Not on file  Depression (PHQ2-9): Low Risk (09/06/2023)   Depression (PHQ2-9)    PHQ-2 Score: 0  Alcohol Screen: Not on file  Housing: Not on file  Utilities: Not on file  Health Literacy: Not on file  Review of Systems:    Constitutional: No fever or chills Cardiovascular: No chest pain Respiratory: No SOB  Gastrointestinal: See HPI and otherwise negative   Physical Exam:  Vital signs: BP 90/62 (BP Location: Right Arm, Patient Position: Sitting)   Ht 5' 7 (1.702 m)   Wt 141 lb 8 oz (64.2 kg)   LMP 04/05/2016   BMI 22.16 kg/m    Constitutional:   Pleasant Caucasian female appears to be in NAD, Well developed, Well nourished, alert and cooperative Respiratory: Respirations even and unlabored. Lungs clear to auscultation bilaterally.   No wheezes, crackles, or rhonchi.  Cardiovascular: Normal S1, S2. No MRG. Regular rate and rhythm. No peripheral edema, cyanosis or pallor.  Gastrointestinal:  Soft, nondistended, nontender. No rebound or guarding. Normal bowel sounds. No appreciable masses or hepatomegaly. Rectal:  Not performed.  Psychiatric: Demonstrates good judgement and reason without abnormal affect or behaviors.  RELEVANT LABS AND IMAGING: CBC    Component Value Date/Time   WBC 5.9 01/29/2024 1206   RBC 4.59 01/29/2024 1206   HGB 13.4 01/29/2024 1206   HCT 40.2 01/29/2024 1206   PLT 262.0 01/29/2024 1206   MCV 87.7 01/29/2024 1206   MCH 28.5 12/02/2022 1622   MCHC 33.4 01/29/2024 1206   RDW 13.6 01/29/2024 1206   LYMPHSABS 2.4 01/29/2024 1206    MONOABS 0.3 01/29/2024 1206   EOSABS 0.2 01/29/2024 1206   BASOSABS 0.1 01/29/2024 1206    CMP     Component Value Date/Time   NA 143 11/24/2023 1359   K 3.7 11/24/2023 1359   CL 107 11/24/2023 1359   CO2 28 11/24/2023 1359   GLUCOSE 94 11/24/2023 1359   BUN 16 11/24/2023 1359   CREATININE 0.78 11/24/2023 1359   CREATININE 0.67 12/02/2022 1622   CALCIUM  9.4 11/24/2023 1359   PROT 6.6 11/24/2023 1359   ALBUMIN 4.3 11/24/2023 1359   AST 18 11/24/2023 1359   ALT 14 11/24/2023 1359   ALKPHOS 62 11/24/2023 1359   BILITOT 0.7 11/24/2023 1359   GFRNONAA >60 07/01/2022 1325   GFRAA >60 12/06/2015 1952    Assessment: 1.  Diarrhea: Chronic for the patient for the past year or so, initially seen by me in October and had a colonoscopy with poor bowel prep, stool studies negative, is status post cholecystectomy, noticed an increase in diarrhea with Zepbound  which she is on for weight loss; consider bile salt induced diarrhea +/- IBS-D 2.  Screening for colorectal cancer: Patient is 43 and had incomplete colonoscopy a year ago, needs repeat now with a 2-day bowel prep  Plan: 1.  Due for repeat colonoscopy for screening with a 2-day bowel prep.  Scheduled patient for repeat with Dr. Nandigam in the [facility].  Did provide the patient a detailed list of risks for the procedure and she agrees to proceed. Patient is appropriate for endoscopic procedure(s) in the ambulatory ([facility]) setting.  2.  Patient advised to hold her Zepbound  for a week prior to the procedures. 3.  Will trial Cholestyramine  4 g p.o. twice daily #60 with 2 refills.  Discussed titration of this. 4.  Also discussed the patient she may want to hold Cholestyramine  prior to time of procedure as she had poor bowel prep last time. 5.  Patient to follow in clinic per recommendations after time of procedure.  Delon Failing, PA-C [facility] 02/13/2024, 11:03 AM  Cc: Wendolyn Jenkins Jansky, MD  "

## 2024-02-14 ENCOUNTER — Ambulatory Visit: Admitting: Physician Assistant

## 2024-02-14 ENCOUNTER — Encounter: Payer: Self-pay | Admitting: Physician Assistant

## 2024-02-14 VITALS — BP 90/62 | Ht 67.0 in | Wt 141.5 lb

## 2024-02-14 DIAGNOSIS — Z1211 Encounter for screening for malignant neoplasm of colon: Secondary | ICD-10-CM

## 2024-02-14 DIAGNOSIS — K529 Noninfective gastroenteritis and colitis, unspecified: Secondary | ICD-10-CM | POA: Diagnosis not present

## 2024-02-14 DIAGNOSIS — R197 Diarrhea, unspecified: Secondary | ICD-10-CM

## 2024-02-14 DIAGNOSIS — Z9049 Acquired absence of other specified parts of digestive tract: Secondary | ICD-10-CM

## 2024-02-14 MED ORDER — NA SULFATE-K SULFATE-MG SULF 17.5-3.13-1.6 GM/177ML PO SOLN: 1.0000 | Freq: Once | ORAL | 0 refills | Status: AC

## 2024-02-14 MED ORDER — CHOLESTYRAMINE 4 G PO PACK: 4.0000 g | PACK | Freq: Two times a day (BID) | ORAL | 2 refills | Status: AC

## 2024-02-14 NOTE — Patient Instructions (Addendum)
 We have sent the following medications to your pharmacy for you to pick up at your convenience: Cholestyramine  4 gram packet twice daily.  You have been scheduled for a colonoscopy. Please follow written instructions given to you at your visit today.   If you use inhalers (even only as needed), please bring them with you on the day of your procedure.  DO NOT TAKE 7 DAYS PRIOR TO TEST- Trulicity (dulaglutide) Ozempic, Wegovy  (semaglutide ) Mounjaro , Zepbound  (tirzepatide ) Bydureon Bcise (exanatide extended release)  DO NOT TAKE 1 DAY PRIOR TO YOUR TEST Rybelsus (semaglutide ) Adlyxin (lixisenatide) Victoza (liraglutide) Byetta (exanatide) ___________________________________________________________________________

## 2024-02-25 ENCOUNTER — Encounter (HOSPITAL_BASED_OUTPATIENT_CLINIC_OR_DEPARTMENT_OTHER): Payer: Self-pay | Admitting: Cardiology

## 2024-02-25 DIAGNOSIS — R079 Chest pain, unspecified: Secondary | ICD-10-CM

## 2024-03-01 ENCOUNTER — Ambulatory Visit: Admitting: Family Medicine

## 2024-03-01 ENCOUNTER — Encounter: Payer: Self-pay | Admitting: Family Medicine

## 2024-03-01 VITALS — BP 110/62 | HR 76 | Temp 97.9°F | Ht 67.0 in | Wt 136.2 lb

## 2024-03-01 DIAGNOSIS — F4323 Adjustment disorder with mixed anxiety and depressed mood: Secondary | ICD-10-CM

## 2024-03-01 DIAGNOSIS — M2550 Pain in unspecified joint: Secondary | ICD-10-CM

## 2024-03-01 DIAGNOSIS — R197 Diarrhea, unspecified: Secondary | ICD-10-CM

## 2024-03-01 MED ORDER — ZEPBOUND 7.5 MG/0.5ML ~~LOC~~ SOAJ
7.5000 mg | SUBCUTANEOUS | 2 refills | Status: AC
  Filled 2024-03-18: qty 2, 28d supply, fill #0

## 2024-03-01 NOTE — Progress Notes (Unsigned)
 "  Subjective:     Patient ID: Angela Hartman, female    DOB: Apr 23, 1980, 44 y.o.   MRN: [id number]  Chief Complaint  Patient presents with   Anxiety    Pt is here for chronic issues    Discussed the use of AI scribe software for clinical note transcription with the patient, who gave verbal consent to proceed.  History of Present Illness Angela Hartman is a 44 year old female who presents with anxiety and recent stressors impacting her mental health.  She has been experiencing ongoing anxiety exacerbated by significant life stressors, including the recent passing of her stepson from cancer and her husband's hospitalization due to influenza B. Additionally, a distressing incident involving her son and his girlfriend has contributed to her stress. She is currently taking hydroxyzine  as needed for anxiety but has not been taking mirtazapine  at night.  She has been experiencing symptoms consistent with influenza, which began on the Sunday following her stepson's funeral. She has been using Alka-Seltzer frequently to manage her symptoms and feels better, though she still experiences chest pain, which she attributes to coughing. Muscle aches, particularly in her legs and back, have improved somewhat. She is unsure of the cause of her leg pain, mentioning possibilities such as a bug or not eating well.  She is not currently taking mirtazapine  or sertraline  due to issues with medication availability and communication with the pharmacy. She is also concerned about the cost and availability of her medication, Zepbound , due to changes in her insurance.  She reports ongoing diarrhea and has an upcoming colonoscopy scheduled to investigate further. She has not been taking cholestyramine , which was previously prescribed for diarrhea, due to issues with medication adherence.  She notes significant weight loss, with her current weight at 136 lbs, down from 141 lbs in December. She reports  a history of poor nutrition and stress eating, and notes recent weight loss in the context of recent illness and stress. No suicidal ideation but significant stress is present.    Health Maintenance Due  Topic Date Due   HIV Screening  Never done   Hepatitis C Screening  Never done   Mammogram  Never done    Past Medical History:  Diagnosis Date   Abnormal Pap smear of cervix    has had several   Anemia    Anxiety    Chest pain 2018   Went to ER, EKG normal, due to anxiety per patient   Chronic constipation 06/06/2022   Gastroesophageal reflux disease 02/23/2022   Headache    migraines   History of hiatal hernia    Ulcer     Past Surgical History:  Procedure Laterality Date   ABDOMINAL HYSTERECTOMY     CHOLECYSTECTOMY     CYSTOCELE REPAIR N/A 05/06/2016   Procedure: Repair Vaginal Cuff Dehisance,;  Surgeon: Rosaline Luna, MD;  Location: [facility];  Service: Gynecology;  Laterality: N/A;   CYSTOSCOPY N/A 04/20/2016   Procedure: CYSTOSCOPY;  Surgeon: Rosaline Luna, MD;  Location: [facility];  Service: Gynecology;  Laterality: N/A;   CYSTOSCOPY  05/06/2016   Procedure: CYSTOSCOPY;  Surgeon: Rosaline Luna, MD;  Location: [facility];  Service: Gynecology;;   ESOPHAGEAL MANOMETRY N/A 05/04/2022   Procedure: ESOPHAGEAL MANOMETRY (EM);  Surgeon: Shila Gustav GAILS, MD;  Location: [facility];  Service: Gastroenterology;  Laterality: N/A;   ROBOTIC ASSISTED TOTAL HYSTERECTOMY WITH BILATERAL SALPINGO OOPHERECTOMY N/A 04/20/2016   Procedure: ROBOTIC ASSISTED TOTAL HYSTERECTOMY WITH BILATERAL SALPINGO OOPHORECTOMY;  Surgeon: Rosaline Luna, MD;  Location: [facility];  Service: Gynecology;  Laterality: N/A;   UPPER GASTROINTESTINAL ENDOSCOPY     WISDOM TOOTH EXTRACTION     XI ROBOTIC ASSISTED PARAESOPHAGEAL HERNIA REPAIR N/A 07/13/2022   Procedure: ROBOTIC PARAESOPHAGEAL HIATAL HERNIA REPAIR WITH TOUPE FUNDOPLICATION;  Surgeon: Sheldon Standing, MD;  Location: [facility];  Service: General;   Laterality: N/A;  GEN w/ [facility] LOCAL    Current Medications[1]  Allergies[2] ROS neg/noncontributory except as noted HPI/below      Objective:     BP 110/62 (BP Location: Left Arm, Patient Position: Sitting, Cuff Size: Normal)   Pulse 76   Temp 97.9 F (36.6 C) (Temporal)   Ht 5' 7 (1.702 m)   Wt 136 lb 4 oz (61.8 kg)   LMP 04/05/2016   SpO2 98%   BMI 21.34 kg/m  Wt Readings from Last 3 Encounters:  03/01/24 136 lb 4 oz (61.8 kg)  02/14/24 141 lb 8 oz (64.2 kg)  02/07/24 140 lb 3.2 oz (63.6 kg)    Physical Exam MEASUREMENTS: Weight- 136. GENERAL: Well developed, well nourished, no acute distress. HEAD EYES EARS NOSE THROAT: Normocephalic, atraumatic, conjunctiva not injected, sclera nonicteric. CARDIAC: Regular rate and rhythm, S1 S2 present, no murmur, dorsalis pedis 2 plus bilaterally. NECK: Supple, no thyromegaly, no nodes, no carotid bruits. LUNGS: Clear to auscultation bilaterally, no wheezes. ABDOMEN: Bowel sounds present, soft, non-tender, non-distended, no hepatosplenomegaly, no masses. EXTREMITIES: No edema. MUSCULOSKELETAL: No gross abnormalities. NEUROLOGICAL: Alert and oriented x3, cranial nerves II through XII intact. PSYCHIATRIC: Normal mood, good eye contact.       Assessment & Plan:  There are no diagnoses linked to this encounter.  Assessment and Plan Assessment & Plan Generalized anxiety disorder   Her anxiety is worsened by recent personal stressors, including the death of her stepson and family conflicts. She is not consistently taking mirtazapine  due to confusion about the medication form and pharmacy communication issues. Encourage taking mirtazapine  as prescribed at night to aid sleep and anxiety. Verified with the pharmacy that mirtazapine  was filled, but not picked up. Emphasize the importance of consistent medication use for anxiety management.  Chronic diarrhea   Diarrhea persists as she has not been taking cholestyramine  as prescribed.  A colonoscopy is scheduled for the end of the month to investigate the cause. Consider filling the cholestyramine  prescription to manage symptoms and proceed with the scheduled colonoscopy.  Abnormal weight loss   Recent weight loss from 141 lbs to 136 lbs since the last visit raises concerns about nutritional intake and stress eating patterns. Monitor weight closely and encourage balanced nutrition. Discuss the importance of avoiding stress eating and maintaining a healthy diet.  Obesity   Management continues with Zepbound , but insurance coverage and cost are concerns. She takes Zepbound  every other week due to insurance issues. Continue Zepbound  as tolerated, considering insurance constraints. Contact insurance to clarify coverage and cost, and explore options for copay cards or other financial assistance.  Chronic low back pain   Pain persists.     No follow-ups on file.  Jenkins CHRISTELLA Carrel, MD     [1]  Current Outpatient Medications:    cholestyramine  (QUESTRAN ) 4 g packet, Take 1 packet (4 g total) by mouth 2 (two) times daily., Disp: 60 each, Rfl: 2   hydrOXYzine  (VISTARIL ) 25 MG capsule, Take 1 capsule (25 mg total) by mouth every 8 (eight) hours as needed., Disp: 90 capsule, Rfl: 1   mirtazapine  (REMERON  SOL-TAB) 15 MG disintegrating  tablet, Take 1 tablet (15 mg total) by mouth at bedtime., Disp: 30 tablet, Rfl: 2   tirzepatide  (ZEPBOUND ) 7.5 MG/0.5ML Pen, Inject 7.5 mg into the skin once a week., Disp: 2 mL, Rfl: 0   TYLENOL  DISSOLVE PACKS 500 MG PACK, Take 500-1,000 mg by mouth 2 (two) times daily as needed (for headaches)., Disp: , Rfl:  [2]  Allergies Allergen Reactions   Other Other (See Comments)    CAN ONLY TAKE LIQUID MEDICATION- NO PILLS   Penicillins Hives and Other (See Comments)    Rash   Tape Rash   "

## 2024-03-01 NOTE — Patient Instructions (Signed)

## 2024-03-04 NOTE — Telephone Encounter (Signed)
 Order placed for gxt per Dr. Lonni. Replied to pt mychart message to notify.

## 2024-03-11 ENCOUNTER — Encounter: Payer: Self-pay | Admitting: Gastroenterology

## 2024-03-12 ENCOUNTER — Encounter (HOSPITAL_COMMUNITY): Payer: Self-pay | Admitting: Cardiology

## 2024-03-15 ENCOUNTER — Telehealth: Payer: Self-pay

## 2024-03-15 NOTE — Telephone Encounter (Signed)
 Left a voicemail for the patient requesting a call back at [phone] to reschedule their colonoscopy appointment. The clinic will be closed on Monday due to inclement weather.

## 2024-03-18 ENCOUNTER — Other Ambulatory Visit: Payer: Self-pay

## 2024-03-18 ENCOUNTER — Encounter: Admitting: Gastroenterology

## 2024-03-18 ENCOUNTER — Other Ambulatory Visit (HOSPITAL_BASED_OUTPATIENT_CLINIC_OR_DEPARTMENT_OTHER): Payer: Self-pay

## 2024-05-13 ENCOUNTER — Encounter: Admitting: Gastroenterology

## 2024-05-29 ENCOUNTER — Ambulatory Visit: Admitting: Family Medicine
